# Patient Record
Sex: Female | Born: 1935 | Race: White | Hispanic: No | Smoking: Never smoker
Health system: Southern US, Community
[De-identification: ages and names within clinical notes are randomized; demographics above are authoritative.]

## PROBLEM LIST (undated history)

## (undated) DIAGNOSIS — Z9889 Other specified postprocedural states: Secondary | ICD-10-CM

## (undated) DIAGNOSIS — E538 Deficiency of other specified B group vitamins: Secondary | ICD-10-CM

## (undated) DIAGNOSIS — I951 Orthostatic hypotension: Secondary | ICD-10-CM

## (undated) DIAGNOSIS — D472 Monoclonal gammopathy: Secondary | ICD-10-CM

## (undated) DIAGNOSIS — D649 Anemia, unspecified: Secondary | ICD-10-CM

## (undated) DIAGNOSIS — I5189 Other ill-defined heart diseases: Secondary | ICD-10-CM

## (undated) DIAGNOSIS — M1990 Unspecified osteoarthritis, unspecified site: Secondary | ICD-10-CM

## (undated) DIAGNOSIS — I10 Essential (primary) hypertension: Secondary | ICD-10-CM

## (undated) DIAGNOSIS — R112 Nausea with vomiting, unspecified: Secondary | ICD-10-CM

## (undated) DIAGNOSIS — M797 Fibromyalgia: Secondary | ICD-10-CM

## (undated) DIAGNOSIS — I2510 Atherosclerotic heart disease of native coronary artery without angina pectoris: Secondary | ICD-10-CM

## (undated) DIAGNOSIS — I350 Nonrheumatic aortic (valve) stenosis: Secondary | ICD-10-CM

## (undated) DIAGNOSIS — M47812 Spondylosis without myelopathy or radiculopathy, cervical region: Secondary | ICD-10-CM

## (undated) DIAGNOSIS — K219 Gastro-esophageal reflux disease without esophagitis: Secondary | ICD-10-CM

## (undated) DIAGNOSIS — K5790 Diverticulosis of intestine, part unspecified, without perforation or abscess without bleeding: Secondary | ICD-10-CM

## (undated) DIAGNOSIS — K589 Irritable bowel syndrome without diarrhea: Secondary | ICD-10-CM

## (undated) DIAGNOSIS — G5600 Carpal tunnel syndrome, unspecified upper limb: Secondary | ICD-10-CM

## (undated) DIAGNOSIS — I447 Left bundle-branch block, unspecified: Secondary | ICD-10-CM

## (undated) HISTORY — PX: VAGINAL HYSTERECTOMY: SUR661

## (undated) HISTORY — DX: Essential (primary) hypertension: I10

## (undated) HISTORY — PX: VESICOVAGINAL FISTULA CLOSURE W/ TAH: SUR271

## (undated) HISTORY — DX: Nonrheumatic aortic (valve) stenosis: I35.0

## (undated) HISTORY — PX: BREAST EXCISIONAL BIOPSY: SUR124

## (undated) HISTORY — DX: Diverticulosis of intestine, part unspecified, without perforation or abscess without bleeding: K57.90

## (undated) HISTORY — DX: Fibromyalgia: M79.7

## (undated) HISTORY — PX: CHOLECYSTECTOMY: SHX55

## (undated) HISTORY — DX: Carpal tunnel syndrome, unspecified upper limb: G56.00

## (undated) HISTORY — PX: OTHER SURGICAL HISTORY: SHX169

## (undated) HISTORY — DX: Orthostatic hypotension: I95.1

## (undated) HISTORY — DX: Spondylosis without myelopathy or radiculopathy, cervical region: M47.812

## (undated) HISTORY — DX: Gastro-esophageal reflux disease without esophagitis: K21.9

## (undated) HISTORY — DX: Unspecified osteoarthritis, unspecified site: M19.90

## (undated) HISTORY — DX: Anemia, unspecified: D64.9

## (undated) HISTORY — DX: Atherosclerotic heart disease of native coronary artery without angina pectoris: I25.10

## (undated) HISTORY — DX: Irritable bowel syndrome, unspecified: K58.9

## (undated) HISTORY — DX: Monoclonal gammopathy: D47.2

## (undated) HISTORY — DX: Other ill-defined heart diseases: I51.89

## (undated) HISTORY — PX: CARDIAC CATHETERIZATION: SHX172

## (undated) HISTORY — DX: Deficiency of other specified B group vitamins: E53.8

---

## 1999-06-17 ENCOUNTER — Encounter: Payer: Self-pay | Admitting: Emergency Medicine

## 1999-06-17 ENCOUNTER — Emergency Department (HOSPITAL_COMMUNITY): Admission: EM | Admit: 1999-06-17 | Discharge: 1999-06-17 | Payer: Self-pay | Admitting: Emergency Medicine

## 2000-01-23 ENCOUNTER — Inpatient Hospital Stay (HOSPITAL_COMMUNITY): Admission: AD | Admit: 2000-01-23 | Discharge: 2000-01-23 | Payer: Self-pay | Admitting: Gynecology

## 2000-02-24 ENCOUNTER — Other Ambulatory Visit: Admission: RE | Admit: 2000-02-24 | Discharge: 2000-02-24 | Payer: Self-pay | Admitting: Gynecology

## 2000-08-16 ENCOUNTER — Ambulatory Visit (HOSPITAL_COMMUNITY): Admission: RE | Admit: 2000-08-16 | Discharge: 2000-08-16 | Payer: Self-pay | Admitting: Gastroenterology

## 2000-08-16 ENCOUNTER — Encounter: Payer: Self-pay | Admitting: Family Medicine

## 2000-08-16 ENCOUNTER — Encounter (INDEPENDENT_AMBULATORY_CARE_PROVIDER_SITE_OTHER): Payer: Self-pay | Admitting: *Deleted

## 2002-01-28 ENCOUNTER — Encounter: Payer: Self-pay | Admitting: Emergency Medicine

## 2002-01-28 ENCOUNTER — Emergency Department (HOSPITAL_COMMUNITY): Admission: EM | Admit: 2002-01-28 | Discharge: 2002-01-29 | Payer: Self-pay | Admitting: Emergency Medicine

## 2002-01-29 ENCOUNTER — Encounter: Payer: Self-pay | Admitting: Neurology

## 2003-01-30 ENCOUNTER — Other Ambulatory Visit: Payer: Self-pay

## 2003-05-19 ENCOUNTER — Other Ambulatory Visit: Admission: RE | Admit: 2003-05-19 | Discharge: 2003-05-19 | Payer: Self-pay | Admitting: Gynecology

## 2004-05-04 ENCOUNTER — Encounter: Payer: Self-pay | Admitting: Interventional Cardiology

## 2004-05-04 ENCOUNTER — Inpatient Hospital Stay (HOSPITAL_COMMUNITY): Admission: AD | Admit: 2004-05-04 | Discharge: 2004-05-05 | Payer: Self-pay | Admitting: Interventional Cardiology

## 2004-06-24 ENCOUNTER — Encounter: Payer: Self-pay | Admitting: Family Medicine

## 2005-02-07 ENCOUNTER — Inpatient Hospital Stay (HOSPITAL_COMMUNITY): Admission: EM | Admit: 2005-02-07 | Discharge: 2005-02-08 | Payer: Self-pay | Admitting: *Deleted

## 2005-03-31 ENCOUNTER — Encounter: Admission: RE | Admit: 2005-03-31 | Discharge: 2005-03-31 | Payer: Self-pay | Admitting: Gastroenterology

## 2005-08-26 ENCOUNTER — Encounter: Payer: Self-pay | Admitting: Family Medicine

## 2006-03-06 ENCOUNTER — Ambulatory Visit: Payer: Self-pay | Admitting: Oncology

## 2006-04-11 ENCOUNTER — Encounter: Payer: Self-pay | Admitting: Family Medicine

## 2006-04-11 LAB — CBC WITH DIFFERENTIAL/PLATELET
BASO%: 0.8 % (ref 0.0–2.0)
EOS%: 2.3 % (ref 0.0–7.0)
HCT: 37 % (ref 34.8–46.6)
LYMPH%: 34.1 % (ref 14.0–48.0)
MCH: 28.2 pg (ref 26.0–34.0)
MCHC: 34.5 g/dL (ref 32.0–36.0)
MCV: 81.7 fL (ref 81.0–101.0)
MONO#: 0.5 10*3/uL (ref 0.1–0.9)
MONO%: 7.4 % (ref 0.0–13.0)
NEUT%: 55.4 % (ref 39.6–76.8)
Platelets: 331 10*3/uL (ref 145–400)

## 2006-04-13 LAB — COMPREHENSIVE METABOLIC PANEL
AST: 22 U/L (ref 0–37)
BUN: 19 mg/dL (ref 6–23)
Calcium: 9.4 mg/dL (ref 8.4–10.5)
Chloride: 103 mEq/L (ref 96–112)
Creatinine, Ser: 0.93 mg/dL (ref 0.40–1.20)
Total Bilirubin: 0.4 mg/dL (ref 0.3–1.2)

## 2006-04-13 LAB — IMMUNOFIXATION ELECTROPHORESIS
IgA: 299 mg/dL (ref 68–378)
IgG (Immunoglobin G), Serum: 1130 mg/dL (ref 694–1618)
IgM, Serum: 187 mg/dL (ref 60–263)
Total Protein, Serum Electrophoresis: 6.8 g/dL (ref 6.0–8.3)

## 2006-04-21 ENCOUNTER — Ambulatory Visit: Payer: Self-pay | Admitting: Oncology

## 2006-04-25 LAB — UIFE/LIGHT CHAINS/TP QN, 24-HR UR
Free Kappa Lt Chains,Ur: 0.8 mg/dL (ref 0.04–1.51)
Free Kappa/Lambda Ratio: 8 ratio — ABNORMAL HIGH (ref 0.46–4.00)
Free Lambda Lt Chains,Ur: 0.1 mg/dL (ref 0.08–1.01)
Free Lt Chn Excr Rate: 24 mg/d
Total Protein, Urine-Ur/day: 33 mg/d (ref 10–140)
Total Protein, Urine: 1.1 mg/dL
Volume, Urine: 3000 mL

## 2006-04-26 LAB — CREATININE CLEARANCE, URINE, 24 HOUR

## 2006-06-06 ENCOUNTER — Ambulatory Visit: Payer: Self-pay | Admitting: Oncology

## 2006-06-08 LAB — COMPREHENSIVE METABOLIC PANEL
ALT: 22 U/L (ref 0–35)
AST: 24 U/L (ref 0–37)
Alkaline Phosphatase: 64 U/L (ref 39–117)
BUN: 22 mg/dL (ref 6–23)
Calcium: 9.3 mg/dL (ref 8.4–10.5)
Chloride: 100 mEq/L (ref 96–112)
Creatinine, Ser: 0.99 mg/dL (ref 0.40–1.20)

## 2006-06-08 LAB — CBC WITH DIFFERENTIAL/PLATELET
BASO%: 0.9 % (ref 0.0–2.0)
EOS%: 1.6 % (ref 0.0–7.0)
HCT: 37.7 % (ref 34.8–46.6)
MCH: 28 pg (ref 26.0–34.0)
MCHC: 34.1 g/dL (ref 32.0–36.0)
MCV: 82 fL (ref 81.0–101.0)
MONO%: 8.5 % (ref 0.0–13.0)
NEUT%: 57.3 % (ref 39.6–76.8)
RDW: 14.6 % — ABNORMAL HIGH (ref 11.3–14.5)
lymph#: 2.1 10*3/uL (ref 0.9–3.3)

## 2006-06-08 LAB — IGG, IGA, IGM
IgA: 313 mg/dL (ref 68–378)
IgG (Immunoglobin G), Serum: 1140 mg/dL (ref 694–1618)
IgM, Serum: 184 mg/dL (ref 60–263)

## 2006-10-21 ENCOUNTER — Encounter: Payer: Self-pay | Admitting: Family Medicine

## 2006-12-05 ENCOUNTER — Ambulatory Visit: Payer: Self-pay | Admitting: Oncology

## 2006-12-07 LAB — CBC WITH DIFFERENTIAL/PLATELET
Basophils Absolute: 0.1 10*3/uL (ref 0.0–0.1)
Eosinophils Absolute: 0.1 10*3/uL (ref 0.0–0.5)
HGB: 14.2 g/dL (ref 11.6–15.9)
MCV: 84.7 fL (ref 81.0–101.0)
MONO#: 0.4 10*3/uL (ref 0.1–0.9)
MONO%: 4.6 % (ref 0.0–13.0)
NEUT#: 6.2 10*3/uL (ref 1.5–6.5)
RBC: 4.82 10*6/uL (ref 3.70–5.32)
RDW: 13.8 % (ref 11.3–14.5)
WBC: 8.5 10*3/uL (ref 3.9–10.0)
lymph#: 1.8 10*3/uL (ref 0.9–3.3)

## 2006-12-07 LAB — COMPREHENSIVE METABOLIC PANEL
Albumin: 4.2 g/dL (ref 3.5–5.2)
Alkaline Phosphatase: 75 U/L (ref 39–117)
BUN: 18 mg/dL (ref 6–23)
CO2: 23 mEq/L (ref 19–32)
Calcium: 9.2 mg/dL (ref 8.4–10.5)
Chloride: 100 mEq/L (ref 96–112)
Glucose, Bld: 104 mg/dL — ABNORMAL HIGH (ref 70–99)
Potassium: 4.4 mEq/L (ref 3.5–5.3)
Sodium: 135 mEq/L (ref 135–145)
Total Protein: 7.5 g/dL (ref 6.0–8.3)

## 2006-12-07 LAB — IGG, IGA, IGM: IgM, Serum: 184 mg/dL (ref 60–263)

## 2007-03-27 ENCOUNTER — Encounter: Payer: Self-pay | Admitting: Family Medicine

## 2007-06-05 ENCOUNTER — Ambulatory Visit: Payer: Self-pay | Admitting: Oncology

## 2007-06-08 ENCOUNTER — Encounter: Payer: Self-pay | Admitting: Family Medicine

## 2007-07-12 ENCOUNTER — Encounter: Payer: Self-pay | Admitting: Family Medicine

## 2007-07-24 ENCOUNTER — Encounter: Payer: Self-pay | Admitting: Family Medicine

## 2007-08-21 ENCOUNTER — Encounter: Payer: Self-pay | Admitting: Family Medicine

## 2007-08-28 ENCOUNTER — Encounter: Payer: Self-pay | Admitting: Family Medicine

## 2007-12-04 ENCOUNTER — Ambulatory Visit: Payer: Self-pay | Admitting: Family Medicine

## 2007-12-04 DIAGNOSIS — K589 Irritable bowel syndrome without diarrhea: Secondary | ICD-10-CM | POA: Insufficient documentation

## 2007-12-04 DIAGNOSIS — E785 Hyperlipidemia, unspecified: Secondary | ICD-10-CM

## 2007-12-04 DIAGNOSIS — E539 Vitamin B deficiency, unspecified: Secondary | ICD-10-CM | POA: Insufficient documentation

## 2007-12-04 DIAGNOSIS — J309 Allergic rhinitis, unspecified: Secondary | ICD-10-CM | POA: Insufficient documentation

## 2007-12-04 DIAGNOSIS — I10 Essential (primary) hypertension: Secondary | ICD-10-CM

## 2007-12-04 DIAGNOSIS — K5730 Diverticulosis of large intestine without perforation or abscess without bleeding: Secondary | ICD-10-CM | POA: Insufficient documentation

## 2007-12-04 DIAGNOSIS — M791 Myalgia, unspecified site: Secondary | ICD-10-CM | POA: Insufficient documentation

## 2007-12-06 ENCOUNTER — Ambulatory Visit: Payer: Self-pay | Admitting: Oncology

## 2007-12-10 LAB — CBC WITH DIFFERENTIAL/PLATELET
Basophils Absolute: 0 10*3/uL (ref 0.0–0.1)
Eosinophils Absolute: 0.1 10*3/uL (ref 0.0–0.5)
HCT: 37.9 % (ref 34.8–46.6)
HGB: 13.1 g/dL (ref 11.6–15.9)
LYMPH%: 26.8 % (ref 14.0–48.0)
MCV: 85.6 fL (ref 81.0–101.0)
MONO%: 4.6 % (ref 0.0–13.0)
NEUT#: 4.9 10*3/uL (ref 1.5–6.5)
NEUT%: 67 % (ref 39.6–76.8)
Platelets: 268 10*3/uL (ref 145–400)
RDW: 13.6 % (ref 11.3–14.5)

## 2007-12-11 ENCOUNTER — Encounter: Payer: Self-pay | Admitting: Family Medicine

## 2007-12-11 LAB — COMPREHENSIVE METABOLIC PANEL
Albumin: 3.9 g/dL (ref 3.5–5.2)
Alkaline Phosphatase: 59 U/L (ref 39–117)
BUN: 27 mg/dL — ABNORMAL HIGH (ref 6–23)
Creatinine, Ser: 1.31 mg/dL — ABNORMAL HIGH (ref 0.40–1.20)
Glucose, Bld: 96 mg/dL (ref 70–99)
Potassium: 4.5 mEq/L (ref 3.5–5.3)

## 2007-12-18 ENCOUNTER — Encounter: Payer: Self-pay | Admitting: Family Medicine

## 2008-01-02 ENCOUNTER — Telehealth: Payer: Self-pay | Admitting: Family Medicine

## 2008-01-21 ENCOUNTER — Ambulatory Visit: Payer: Self-pay | Admitting: Family Medicine

## 2008-01-21 DIAGNOSIS — G5600 Carpal tunnel syndrome, unspecified upper limb: Secondary | ICD-10-CM

## 2008-01-21 DIAGNOSIS — M5030 Other cervical disc degeneration, unspecified cervical region: Secondary | ICD-10-CM

## 2008-01-21 DIAGNOSIS — M19049 Primary osteoarthritis, unspecified hand: Secondary | ICD-10-CM

## 2008-01-28 ENCOUNTER — Encounter: Payer: Self-pay | Admitting: Family Medicine

## 2008-03-14 ENCOUNTER — Ambulatory Visit: Payer: Self-pay | Admitting: Family Medicine

## 2008-03-14 LAB — CONVERTED CEMR LAB
Glucose, Urine, Semiquant: NEGATIVE
Ketones, urine, test strip: NEGATIVE
Nitrite: NEGATIVE
RBC / HPF: 0
Specific Gravity, Urine: 1.015
WBC Urine, dipstick: NEGATIVE
WBC, UA: 0 cells/hpf
pH: 6.5

## 2008-03-20 ENCOUNTER — Inpatient Hospital Stay (HOSPITAL_COMMUNITY): Admission: EM | Admit: 2008-03-20 | Discharge: 2008-03-20 | Payer: Self-pay | Admitting: Emergency Medicine

## 2008-03-26 ENCOUNTER — Ambulatory Visit: Payer: Self-pay | Admitting: Family Medicine

## 2008-03-26 DIAGNOSIS — R350 Frequency of micturition: Secondary | ICD-10-CM

## 2008-03-26 DIAGNOSIS — K219 Gastro-esophageal reflux disease without esophagitis: Secondary | ICD-10-CM | POA: Insufficient documentation

## 2008-03-26 LAB — CONVERTED CEMR LAB
Bilirubin Urine: NEGATIVE
Blood in Urine, dipstick: NEGATIVE
Nitrite: NEGATIVE
Urobilinogen, UA: 0.2
pH: 6.5

## 2008-04-11 ENCOUNTER — Ambulatory Visit: Payer: Self-pay | Admitting: Family Medicine

## 2008-04-14 LAB — CONVERTED CEMR LAB
Basophils Absolute: 0.1 10*3/uL (ref 0.0–0.1)
Basophils Relative: 1 % (ref 0–1)
Eosinophils Relative: 1 % (ref 0–5)
MCV: 82.2 fL (ref 78.0–100.0)
Neutro Abs: 4.4 10*3/uL (ref 1.7–7.7)

## 2008-04-15 ENCOUNTER — Encounter: Payer: Self-pay | Admitting: Family Medicine

## 2008-05-12 ENCOUNTER — Telehealth: Payer: Self-pay | Admitting: Family Medicine

## 2008-05-14 ENCOUNTER — Encounter (INDEPENDENT_AMBULATORY_CARE_PROVIDER_SITE_OTHER): Payer: Self-pay | Admitting: Internal Medicine

## 2008-05-14 ENCOUNTER — Ambulatory Visit: Payer: Self-pay | Admitting: Family Medicine

## 2008-05-14 DIAGNOSIS — N760 Acute vaginitis: Secondary | ICD-10-CM | POA: Insufficient documentation

## 2008-05-14 LAB — CONVERTED CEMR LAB

## 2008-05-30 ENCOUNTER — Ambulatory Visit: Payer: Self-pay | Admitting: Family Medicine

## 2008-06-05 ENCOUNTER — Ambulatory Visit: Payer: Self-pay | Admitting: Oncology

## 2008-06-09 ENCOUNTER — Encounter: Payer: Self-pay | Admitting: Family Medicine

## 2008-06-09 LAB — CBC WITH DIFFERENTIAL/PLATELET
Basophils Absolute: 0 10*3/uL (ref 0.0–0.1)
Eosinophils Absolute: 0.1 10*3/uL (ref 0.0–0.5)
HCT: 37.8 % (ref 34.8–46.6)
LYMPH%: 24.2 % (ref 14.0–49.7)
MCHC: 34 g/dL (ref 31.5–36.0)
MONO#: 0.4 10*3/uL (ref 0.1–0.9)
NEUT#: 4.4 10*3/uL (ref 1.5–6.5)
NEUT%: 68.6 % (ref 38.4–76.8)
Platelets: 303 10*3/uL (ref 145–400)
WBC: 6.5 10*3/uL (ref 3.9–10.3)

## 2008-06-09 LAB — COMPREHENSIVE METABOLIC PANEL
AST: 28 U/L (ref 0–37)
Albumin: 3.9 g/dL (ref 3.5–5.2)
Alkaline Phosphatase: 79 U/L (ref 39–117)
Calcium: 9.5 mg/dL (ref 8.4–10.5)
Chloride: 93 mEq/L — ABNORMAL LOW (ref 96–112)
Potassium: 4.3 mEq/L (ref 3.5–5.3)
Sodium: 129 mEq/L — ABNORMAL LOW (ref 135–145)
Total Protein: 7.2 g/dL (ref 6.0–8.3)

## 2008-06-11 ENCOUNTER — Ambulatory Visit: Payer: Self-pay | Admitting: Family Medicine

## 2008-06-11 DIAGNOSIS — Z780 Asymptomatic menopausal state: Secondary | ICD-10-CM | POA: Insufficient documentation

## 2008-06-12 ENCOUNTER — Ambulatory Visit: Payer: Self-pay | Admitting: Family Medicine

## 2008-06-13 ENCOUNTER — Ambulatory Visit: Payer: Self-pay | Admitting: Family Medicine

## 2008-06-16 LAB — CONVERTED CEMR LAB
TSH: 2.291 microintl units/mL (ref 0.350–4.500)
Vitamin B-12: 660 pg/mL (ref 211–911)

## 2008-06-17 ENCOUNTER — Encounter: Payer: Self-pay | Admitting: Family Medicine

## 2008-06-17 ENCOUNTER — Ambulatory Visit: Payer: Self-pay | Admitting: Internal Medicine

## 2008-12-01 ENCOUNTER — Ambulatory Visit: Payer: Self-pay | Admitting: Family Medicine

## 2008-12-05 ENCOUNTER — Ambulatory Visit: Payer: Self-pay | Admitting: Oncology

## 2008-12-09 LAB — LACTATE DEHYDROGENASE: LDH: 83 U/L — ABNORMAL LOW (ref 94–250)

## 2008-12-09 LAB — COMPREHENSIVE METABOLIC PANEL
ALT: 17 U/L (ref 0–35)
AST: 21 U/L (ref 0–37)
Calcium: 9.3 mg/dL (ref 8.4–10.5)
Chloride: 95 mEq/L — ABNORMAL LOW (ref 96–112)
Creatinine, Ser: 1.04 mg/dL (ref 0.40–1.20)

## 2008-12-09 LAB — CBC WITH DIFFERENTIAL/PLATELET
BASO%: 0 % (ref 0.0–2.0)
LYMPH%: 18.1 % (ref 14.0–49.7)
MCHC: 33.5 g/dL (ref 31.5–36.0)
MONO#: 0.4 10*3/uL (ref 0.1–0.9)
Platelets: 316 10*3/uL (ref 145–400)
RBC: 4.83 10*6/uL (ref 3.70–5.45)
RDW: 13.6 % (ref 11.2–14.5)
WBC: 7.8 10*3/uL (ref 3.9–10.3)
lymph#: 1.4 10*3/uL (ref 0.9–3.3)

## 2009-01-27 ENCOUNTER — Ambulatory Visit: Payer: Self-pay | Admitting: Family Medicine

## 2009-01-27 LAB — CONVERTED CEMR LAB
Bacteria, UA: 0
Bilirubin Urine: NEGATIVE
Glucose, Urine, Semiquant: NEGATIVE
Nitrite: NEGATIVE
Specific Gravity, Urine: <1.005
Urine crystals, microscopic: 0 /hpf
WBC Urine, dipstick: NEGATIVE
WBC, UA: 0 cells/hpf
Yeast, UA: 0
pH: 7

## 2009-01-28 ENCOUNTER — Ambulatory Visit: Payer: Self-pay | Admitting: Family Medicine

## 2009-01-28 DIAGNOSIS — R519 Headache, unspecified: Secondary | ICD-10-CM | POA: Insufficient documentation

## 2009-01-28 DIAGNOSIS — R51 Headache: Secondary | ICD-10-CM

## 2009-01-28 DIAGNOSIS — F430 Acute stress reaction: Secondary | ICD-10-CM

## 2009-01-28 LAB — CONVERTED CEMR LAB
KOH Prep: NEGATIVE
Whiff Test: NEGATIVE

## 2009-01-30 ENCOUNTER — Encounter (INDEPENDENT_AMBULATORY_CARE_PROVIDER_SITE_OTHER): Payer: Self-pay | Admitting: *Deleted

## 2009-01-30 ENCOUNTER — Encounter: Payer: Self-pay | Admitting: Family Medicine

## 2009-01-30 LAB — CONVERTED CEMR LAB
Basophils Absolute: 0 10*3/uL (ref 0.0–0.1)
Eosinophils Absolute: 0.1 10*3/uL (ref 0.0–0.7)
Eosinophils Relative: 2 % (ref 0.0–5.0)
Lymphocytes Relative: 27.9 % (ref 12.0–46.0)
MCHC: 33.4 g/dL (ref 30.0–36.0)
Monocytes Absolute: 0.5 10*3/uL (ref 0.1–1.0)
Neutro Abs: 4.7 10*3/uL (ref 1.4–7.7)
Neutrophils Relative %: 63.6 % (ref 43.0–77.0)
Platelets: 297 10*3/uL (ref 150.0–400.0)
Sed Rate: 32 mm/hr — ABNORMAL HIGH (ref 0–22)

## 2009-02-02 ENCOUNTER — Ambulatory Visit: Payer: Self-pay | Admitting: Internal Medicine

## 2009-02-06 ENCOUNTER — Ambulatory Visit: Payer: Self-pay | Admitting: Family Medicine

## 2009-02-11 ENCOUNTER — Encounter: Payer: Self-pay | Admitting: Family Medicine

## 2009-04-16 ENCOUNTER — Inpatient Hospital Stay (HOSPITAL_COMMUNITY)
Admission: EM | Admit: 2009-04-16 | Discharge: 2009-04-18 | Payer: Self-pay | Source: Home / Self Care | Admitting: Neurology

## 2009-04-17 ENCOUNTER — Ambulatory Visit: Payer: Self-pay | Admitting: Vascular Surgery

## 2009-06-03 ENCOUNTER — Telehealth: Payer: Self-pay | Admitting: Family Medicine

## 2009-06-05 ENCOUNTER — Ambulatory Visit: Payer: Self-pay | Admitting: Oncology

## 2009-06-08 LAB — CBC WITH DIFFERENTIAL/PLATELET
Basophils Absolute: 0.1 10*3/uL (ref 0.0–0.1)
EOS%: 1.3 % (ref 0.0–7.0)
HCT: 39.3 % (ref 34.8–46.6)
HGB: 13.1 g/dL (ref 11.6–15.9)
MCH: 28.5 pg (ref 25.1–34.0)
MCV: 85.7 fL (ref 79.5–101.0)
MONO%: 5.6 % (ref 0.0–14.0)
NEUT%: 64.7 % (ref 38.4–76.8)
Platelets: 278 10*3/uL (ref 145–400)

## 2009-06-08 LAB — COMPREHENSIVE METABOLIC PANEL
AST: 25 U/L (ref 0–37)
Albumin: 3.4 g/dL — ABNORMAL LOW (ref 3.5–5.2)
Alkaline Phosphatase: 52 U/L (ref 39–117)
BUN: 22 mg/dL (ref 6–23)
Potassium: 4.1 mEq/L (ref 3.5–5.3)
Sodium: 136 mEq/L (ref 135–145)

## 2009-06-08 LAB — IGG, IGA, IGM
IgA: 275 mg/dL (ref 68–378)
IgM, Serum: 168 mg/dL (ref 60–263)

## 2009-08-05 ENCOUNTER — Telehealth: Payer: Self-pay | Admitting: Family Medicine

## 2009-09-10 ENCOUNTER — Ambulatory Visit: Payer: Self-pay | Admitting: Family Medicine

## 2009-09-10 LAB — CONVERTED CEMR LAB
Bilirubin Urine: NEGATIVE
Blood in Urine, dipstick: NEGATIVE
Glucose, Urine, Semiquant: NEGATIVE
Urobilinogen, UA: 0.2

## 2009-09-11 ENCOUNTER — Encounter: Payer: Self-pay | Admitting: Family Medicine

## 2009-09-11 ENCOUNTER — Telehealth: Payer: Self-pay | Admitting: Family Medicine

## 2009-09-14 ENCOUNTER — Telehealth: Payer: Self-pay | Admitting: Family Medicine

## 2009-09-16 ENCOUNTER — Ambulatory Visit: Payer: Self-pay | Admitting: Family Medicine

## 2009-09-16 DIAGNOSIS — G609 Hereditary and idiopathic neuropathy, unspecified: Secondary | ICD-10-CM

## 2009-09-17 ENCOUNTER — Telehealth (INDEPENDENT_AMBULATORY_CARE_PROVIDER_SITE_OTHER): Payer: Self-pay | Admitting: *Deleted

## 2009-09-21 ENCOUNTER — Encounter: Payer: Self-pay | Admitting: Family Medicine

## 2009-09-21 LAB — CONVERTED CEMR LAB
Albumin: 3.8 g/dL (ref 3.5–5.2)
CO2: 29 meq/L (ref 19–32)
Eosinophils Relative: 0.9 % (ref 0.0–5.0)
Glucose, Bld: 98 mg/dL (ref 70–99)
HCT: 38 % (ref 36.0–46.0)
Hemoglobin: 12.8 g/dL (ref 12.0–15.0)
MCHC: 33.6 g/dL (ref 30.0–36.0)
MCV: 86 fL (ref 78.0–100.0)
Monocytes Relative: 6.2 % (ref 3.0–12.0)
RBC: 4.42 M/uL (ref 3.87–5.11)
RDW: 14.4 % (ref 11.5–14.6)
Sed Rate: 24 mm/hr — ABNORMAL HIGH (ref 0–22)
WBC: 7.4 10*3/uL (ref 4.5–10.5)

## 2009-10-12 ENCOUNTER — Ambulatory Visit: Payer: Self-pay | Admitting: Family Medicine

## 2009-10-23 ENCOUNTER — Ambulatory Visit: Payer: Self-pay | Admitting: Pulmonary Disease

## 2009-10-23 ENCOUNTER — Encounter: Payer: Self-pay | Admitting: Pulmonary Disease

## 2009-10-23 DIAGNOSIS — R05 Cough: Secondary | ICD-10-CM

## 2009-11-20 ENCOUNTER — Ambulatory Visit: Payer: Self-pay | Admitting: Pulmonary Disease

## 2009-11-26 ENCOUNTER — Ambulatory Visit: Payer: Self-pay | Admitting: Cardiology

## 2009-11-30 ENCOUNTER — Ambulatory Visit: Payer: Self-pay | Admitting: Family Medicine

## 2009-11-30 ENCOUNTER — Telehealth: Payer: Self-pay | Admitting: Family Medicine

## 2009-11-30 DIAGNOSIS — R109 Unspecified abdominal pain: Secondary | ICD-10-CM | POA: Insufficient documentation

## 2009-11-30 LAB — CONVERTED CEMR LAB
Casts: 0 /lpf
Ketones, urine, test strip: NEGATIVE
WBC Urine, dipstick: NEGATIVE
WBC, UA: 0 cells/hpf
Whiff Test: NEGATIVE
Yeast, UA: 0

## 2009-12-02 ENCOUNTER — Encounter: Payer: Self-pay | Admitting: Family Medicine

## 2009-12-08 ENCOUNTER — Ambulatory Visit: Payer: Self-pay | Admitting: Oncology

## 2009-12-08 ENCOUNTER — Encounter: Payer: Self-pay | Admitting: Family Medicine

## 2009-12-09 ENCOUNTER — Encounter: Payer: Self-pay | Admitting: Family Medicine

## 2009-12-09 ENCOUNTER — Ambulatory Visit: Payer: Self-pay | Admitting: Emergency Medicine

## 2009-12-10 ENCOUNTER — Encounter: Payer: Self-pay | Admitting: Family Medicine

## 2009-12-10 LAB — CONVERTED CEMR LAB
Basophils Absolute: 0 10*3/uL (ref 0.0–0.1)
Basophils Relative: 0.5 % (ref 0.0–3.0)
Eosinophils Absolute: 0.2 10*3/uL (ref 0.0–0.7)
Eosinophils Relative: 2.6 % (ref 0.0–5.0)
Hemoglobin: 12.8 g/dL (ref 12.0–15.0)
Lymphocytes Relative: 27 % (ref 12.0–46.0)
MCHC: 34.1 g/dL (ref 30.0–36.0)
Monocytes Absolute: 0.5 10*3/uL (ref 0.1–1.0)
Neutro Abs: 4 10*3/uL (ref 1.4–7.7)
Neutrophils Relative %: 62.2 % (ref 43.0–77.0)
RDW: 14 % (ref 11.5–14.6)
WBC: 6.4 10*3/uL (ref 4.5–10.5)

## 2009-12-29 ENCOUNTER — Ambulatory Visit: Payer: Self-pay | Admitting: Emergency Medicine

## 2010-01-01 LAB — PATHOLOGY REPORT

## 2010-01-13 ENCOUNTER — Ambulatory Visit: Payer: Self-pay | Admitting: Oncology

## 2010-01-20 ENCOUNTER — Telehealth: Payer: Self-pay | Admitting: Family Medicine

## 2010-02-12 ENCOUNTER — Ambulatory Visit: Payer: Self-pay | Admitting: Oncology

## 2010-02-15 LAB — CBC WITH DIFFERENTIAL/PLATELET
Basophils Absolute: 0 10*3/uL (ref 0.0–0.1)
EOS%: 1.3 % (ref 0.0–7.0)
LYMPH%: 25.4 % (ref 14.0–49.7)
MCH: 28.8 pg (ref 25.1–34.0)
MCHC: 34 g/dL (ref 31.5–36.0)
MCV: 84.6 fL (ref 79.5–101.0)
MONO%: 8 % (ref 0.0–14.0)
NEUT#: 5.2 10*3/uL (ref 1.5–6.5)
NEUT%: 64.7 % (ref 38.4–76.8)
Platelets: 315 10*3/uL (ref 145–400)
RDW: 14.4 % (ref 11.2–14.5)
WBC: 8.1 10*3/uL (ref 3.9–10.3)

## 2010-02-15 LAB — COMPREHENSIVE METABOLIC PANEL
ALT: 17 U/L (ref 0–35)
Albumin: 4.2 g/dL (ref 3.5–5.2)
Alkaline Phosphatase: 59 U/L (ref 39–117)
BUN: 22 mg/dL (ref 6–23)
Chloride: 96 mEq/L (ref 96–112)
Glucose, Bld: 103 mg/dL — ABNORMAL HIGH (ref 70–99)
Sodium: 132 mEq/L — ABNORMAL LOW (ref 135–145)
Total Bilirubin: 0.5 mg/dL (ref 0.3–1.2)

## 2010-02-15 LAB — IGG, IGA, IGM
IgA: 290 mg/dL (ref 68–378)
IgG (Immunoglobin G), Serum: 1040 mg/dL (ref 694–1618)

## 2010-02-25 NOTE — Progress Notes (Signed)
Summary: nexium  Phone Note Refill Request Call back at Home Phone [phone] Message from:  Patient on September 11, 2009 1:25 PM  Refills Requested: Medication #1:  NEXIUM 40 MG CPDR one by mouth once daily Patient needs script sent to [facility].   Initial call taken by: Melody Comas,  September 11, 2009 1:26 PM  Follow-up for Phone Call        Script sent to pharmacy. Follow-up by: Lowella Petties CMA,  September 11, 2009 2:31 PM    Prescriptions: NEXIUM 40 MG CPDR (ESOMEPRAZOLE MAGNESIUM) one by mouth once daily  #90 x 2   Entered by:   Lowella Petties CMA   Authorized by:   Judith Part MD   Signed by:   Lowella Petties CMA on 09/11/2009   Method used:   Faxed to ...       [facility] (mail-order)             , [place]         Ph: [phone]       Fax: [phone]   RxID:   [id number]

## 2010-02-25 NOTE — Assessment & Plan Note (Signed)
Summary: consult for chronic cough   Visit Type:  Initial Consult Copy to:  Roxy Manns MD Primary Provider/Referring Provider:  Judith Part MD  CC:  Pulmonary consult. [name]  History of Present Illness: The pt is a 77y/o female who I have been asked to see for chronic cough.  The pt states that she has been coughing for 3 years, and it has been coming and going during this time.  It is worse at night, and produces minimal mucus.  It typically starts in the am's, and she gets hoarse as the day progresses.  She will occasionally feel congestion in her chest, and can also have sinus pressure at times.  She has severe postnasal drip that is improved on atrovent nasal spray.  She also uses a nettie pot two times a day.  She has rare reflux symptoms, and takes nexium one a day.  She does have a globus sensation, and will do frequent throat clearing.  She has no h/o childhood asthma, and has had no breathing issues.  She had a cxr approx 6mos ago with "something in RML"?  She had a ct sinus done in Jan with no acute or chronic process.  The pt tells me that she has seen ENT in [facility] one month ago, where allergy testing showed only a sensitivity to dust mites, and she was told that her VC were "stretched"?  Current Medications (verified): 1)  Ogen 1.25 1.5 Mg Tabs (Estropipate) .... One By Mouth Daily 2)  Lipitor 20 Mg Tabs (Atorvastatin Calcium) .... One By Mouth Daily 3)  Vitamin D3 1000 Unit .... .three Tabs By Mouth Daily 4)  Magnesium 500 Mg Tabs (Magnesium) .... One By Mouth Two Times A Day 5)  Calcium 500 Mg Tabs (Calcium Carbonate) .... One By Mouth Three Times A Day 6)  Vitamin B-1 100 Mg Tabs (Thiamine Hcl) .... One By Mouth Daily 7)  Hyoscyamine Sulfate  1.25mg  .... 2-4 Tabs By Mouth Daily 8)  Flexeril 10 Mg Tabs (Cyclobenzaprine Hcl) .... 1/2 By Mouth Qhs 9)  Nystatin 100000 Unit/gm Crea (Nystatin) .... Apply Small Amount To Affected Areas Once Daily As Needed 10)  Bactroban 2 % Oint  (Mupirocin) .... Apply To Affected Area Two Times A Day in Nose As Needed. 11)  Lovaza 1 Gm Caps (Omega-3-Acid Ethyl Esters) .... Take One Tablet By Mouth Three Times Daily 12)  Zyrtec Allergy 10 Mg Tabs (Cetirizine Hcl) .... Otc As Directed. 13)  Nexium 40 Mg Cpdr (Esomeprazole Magnesium) .... Take 1 Capsule By Mouth Once A Day Before Breakfast. 14)  Netty Pot .... Otc As Directed. 15)  Prednisolone Acetate 1 % Susp (Prednisolone Acetate) .... One Drop Per Eye Once Daily 16)  Ipratropium Bromide 0.06 % Soln (Ipratropium Bromide) .... 2 Sprays Each Nostrile Two Times A Day 17)  Tessalon Perles 100 Mg  Caps (Benzonatate) .... Two By Mouth Every 6 Hrs If Needed For Cough  Allergies (verified): 1)  ! Sulfa  Past History:  Past Medical History: anemia  IGg monoclonal gammopathy arthritis diverticulosis GERD all rhinitis heart M hyperlipidemia  urinary incontinence  IBS fibromyalgia  carpal tunnel  cervical deg disc dz  CAD - 1 vessel with med control  chronic dry eyes  sees heme for elevated lab (? what)  HTN - recent  vit B12 deficiency (shots every month) overactive bladder/ incontinence ? TIa vs migraine  carotid dopplers nl 3/11  rheum-- Dr Jon Billings  GI-- Dr Matthias Hughs  heme-- Dr Arline Asp  gyn -- Dr Greta Doom  cardiol-- Dr Katrinka Blazing  Past Surgical History: tsa hysterectomy-- all but one ovary 1969 (for bleeding, and fibroids) colonoscopy 8/07 - normal/ no polyps , some tics  stress test ? 08 cardiac cath 2/10 neg (per pt)- Dr Katrinka Blazing dexa - normal 5/10 hosp 3/11 -- TIA vs poss migraine -  nl scans and no carotid stenosis Cholecystectomy  Family History: Reviewed history from 01/28/2009 and no changes required. mother - died with diverticulosis , OP  father - killed in accident at age 47, CAD P aunts and uncles with heart dz  2 P aunts with breast cancer  M aunts and uncles with CAD  4 brothers -- CAD 1 brother - prostate ca  1 brother- leukemia  grand daughter-  bipolar   Social History: Reviewed history from 01/28/2009 and no changes required. 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   Review of Systems       The patient complains of productive cough, abdominal pain, difficulty swallowing, nasal congestion/difficulty breathing through nose, sneezing, joint stiffness or pain, and change in color of mucus.  The patient denies shortness of breath with activity, shortness of breath at rest, non-productive cough, coughing up blood, chest pain, irregular heartbeats, acid heartburn, indigestion, loss of appetite, weight change, sore throat, tooth/dental problems, headaches, itching, ear ache, anxiety, depression, hand/feet swelling, rash, and fever.    Vital Signs:  Patient profile:   77 year old female Height:      62.5 inches Weight:      147.50 pounds BMI:     26.64 O2 Sat:      95 % on Room air Temp:     97.7 degrees F oral Pulse rate:   82 / minute BP sitting:   132 / 68  (right arm) Cuff size:   regular  Vitals Entered By: Carver Fila (October 23, 2009 2:39 PM)  O2 Flow:  Room air CC: Pulmonary consult.  Comments meds and allergies updated Phone number updated Carver Fila  October 23, 2009 2:39 PM    Physical Exam  General:  wd female in nad Eyes:  PERRLA and EOMI.   Nose:  narrowed bilat, patent, no purulence or drainage noted. Mouth:  clear, no lesion or exudates. Neck:  no jvd, tmg, LN Lungs:  totally clear to auscultation Heart:  rrr,no rubs or gallops.  2/6 sem Abdomen:  soft and nontender, bs+ Extremities:  no edema or cyanosis , pulses intact distally Neurologic:  alert and oriented, moves all 4.   Impression & Recommendations:  Problem # 1:  COUGH, CHRONIC (ICD-786.2) The pt's cough sounds more upper airway in origin than lower.   She has a known issue with postnasal drip, and she definitely has a cyclical component to her cough.  She may respond to behavioral therapy to try and "cool off" her upper airway.  What I do not know is whether she may have something pulmonary going on.  Her spirometry today is totally normal, but there is a question of some abnormality in her RML?  ?bronchiectasis?  At this point, I would like to treat the most likely cause of her cough, namely an upper airway issue.  If she fails to respond, will have to consider whether to re-image her sinuses or check ct chest to  evaluate for bronchiectasis.  Medications Added to Medication List This Visit: 1)  Prednisolone Acetate 1 % Susp (Prednisolone acetate) .... One drop per eye once daily 2)  Ipratropium Bromide 0.06 % Soln (Ipratropium bromide) .... 2 sprays each nostrile two times a day 3)  Tessalon Perles 100 Mg Caps (Benzonatate) .... Two by mouth every 6 hrs if needed for cough  Other Orders: Consultation Level IV (91478)  Patient Instructions: 1)  increase nexium to am and pm for next 3 weeks. 2)  stop zyrtec, and start chlorpheniramine 8mg  at bedtime everynight for next 3 weeks 3)  keep hard candy in your mouth (no peppermint or menthol) all day to bathe the back of your throat. 4)  no throat clearing...just swallow or drink something. 5)  minimize voice use...no singing or yelling.  minimize conversation for the next 3 weeks. 6)  tessalon pearls 2 every 6 hrs if needed for cough. 7)  followup with me in 3 weeks.   Prescriptions: TESSALON PERLES 100 MG  CAPS (BENZONATATE) two by mouth every 6 hrs if needed for cough  #30 x 1   Entered and Authorized by:   Barbaraann Share MD   Signed by:   Barbaraann Share MD on 10/23/2009   Method used:   Print then Give to Patient   RxID:   [id number]    Immunization History:  Influenza Immunization History:    Influenza:  historical (11/02/2008)

## 2010-02-25 NOTE — Letter (Signed)
[facility]: [facility]   Imported By: Lanelle Bal 12/16/2009 10:12:30  _____________________________________________________________________  External Attachment:    Type:   Image     Comment:   External Document

## 2010-02-25 NOTE — Assessment & Plan Note (Signed)
Summary: CONGESTION, POSSIBLE BLADDER INFECTION   Vital Signs:  Patient profile:   77 year old female Weight:      149 pounds Temp:     97.6 degrees F oral Pulse rate:   72 / minute Pulse rhythm:   regular BP sitting:   140 / 70  (left arm) Cuff size:   regular  Vitals Entered By: Lowella Petties CMA February 14, 2009 3:27 PM) CC: Sinus symptoms x one month, pain around anal area.   History of Present Illness: is feeling bad  sinus problems -- since noveber  got better after last course of abx for 2 days and sinus symptoms came back  using mucinex and benadryl   feels lousy  face aches and hurts on left side  nasal saline makes her nose burn  prod cough every am - is yellow in color (coughs off and on all day and at night )  is gargling with salt water  no fever - but does get a little chilled in afternoons  generally tired and run down for no reason   overall a productive cough in ams - for 4 years  no wheeze or sob   takes zyrtec for allergies  her urine had an odor dropped off specimen yesterday- ua was totaly clear some vulvar burning too - with a discharge -- ? if a vaginitis , is clear - ? if odor  notices discomfort  used a little monistat otc -- helped some , little itching   is having anxiety symptoms -- needs be calmed down -- - cries over everything -- grandaughter is the source of it -- bipolar with a  baby and giving the family a fit  mostly sensitive and weepy  she refuses counsling  has good friends and family to vent to  does occas write in a journal   Allergies: 1)  ! Sulfa  Past History:  Past Medical History: Last updated: 04/11/2008 anemia  IGg monoclonal gammopathy arthritis diverticulosis Chronic bronchitis  GERD all rhinitis heart M hyperlipidemia  urinary incontinence  IBS fibromyalgia  carpal tunnel  cervical deg disc dz  CAD - 1 vessel with med control  chronic dry eyes  sees heme for elevated lab (? what)  HTN - recent    vit B12 deficiency (shots every month) overactive bladder/ incontinence  rheum-- Dr Jon Billings  GI-- Dr Matthias Hughs  heme-- Dr Arline Asp  gyn -- Dr Greta Doom  cardiol-- Dr Katrinka Blazing  Past Surgical History: Last updated: 07/06/2008 [facility] tsa hysterectomy-- all but one ovary 1969 (for bleeding, and fibroids) colonoscopy 8/07 - normal/ no polyps , some tics  stress test ? 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/ brothers  floor exercises / stretches  cares for 2 acre yard -all year around  has bipolar grandaughter caring for child-- this is very stressful for her   Family History: mother - died with diverticulosis , OP  father - killed in accident at age 83, CAD  P  aunts and uncles with heart dz  2 P aunts with breast cancer  M aunts and uncles with CAD   3 brothers -- CAD 1 brother - prostate ca  1 brother- leukemia  grand daughter- bipolar   Social History: retired post Geologist, 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 Eyes:  Complains of eye pain; denies blurring, discharge, double vision, and eye irritation. ENT:  Complains of nasal congestion, postnasal drainage, and sinus pressure; denies sore throat. CV:  Denies chest pain or discomfort, lightheadness, and palpitations. Resp:  Denies  cough and wheezing. GI:  Denies abdominal pain, bloody stools, change in bowel habits, indigestion, and nausea. GU:  Complains of discharge and dysuria; denies abnormal vaginal bleeding and urinary hesitancy. MS:  Denies joint pain, joint redness, and joint swelling. Derm:  Denies lesion(s), poor wound healing, and rash. Neuro:  Complains of headaches; denies difficulty with concentration, disturbances in coordination, falling down, inability to speak, memory loss, numbness, tingling, visual disturbances, and weakness. Psych:  Complains of anxiety; denies depression. Endo:  Denies excessive thirst and excessive urination. Heme:  Denies abnormal bruising.  Physical Exam  General:  Well-developed,well-nourished,in no acute distress; alert,appropriate and cooperative throughout examination Head:  normocephalic, atraumatic, and no abnormalities observed.  tender L ethmoid/ maxillary sinuses mildly tender temporal area  TMJ not tender - nl rom  , no crepitice  Eyes:  vision grossly intact, pupils equal, pupils round, pupils reactive to light, and no injection.   Ears:  R ear normal and L ear normal.   Nose:  nares are injected and mildly congested  Mouth:  pharynx pink and moist, no erythema, and no exudates.   Neck:  supple with full rom and no masses or thyromegally, no JVD or carotid bruit  Lungs:  Normal respiratory effort, chest expands symmetrically. Lungs are clear to auscultation, no crackles or wheezes. Heart:  RRR with systolic M Abdomen:  very mild suprapubic tenderness without rebound or gaurding   Genitalia:  normal introitus, no external lesions, no vaginal discharge, and mucosa pink and moist.   Msk:  no CVA tenderness  nl rom hips and LS  Extremities:  No clubbing, cyanosis, edema, or deformity noted with normal full range of motion of all joints.   Neurologic:  cranial nerves II-XII intact, sensation intact to light touch, gait normal, and DTRs symmetrical and normal.  no  tremor  Skin:  Intact without suspicious lesions or rashes Cervical Nodes:  No lymphadenopathy noted Psych:  anxious today- but not tearful good eye contact and comm skills    Impression & Recommendations:  Problem # 1:  FACIAL PAIN (ICD-784.0) Assessment Deteriorated ongoing with congestion and chronic cough despite tx for sinusitis 2 mo ago  will check ESR and cbc with diff today (need to rule out temporal arteritis -- in light of location of pain) check limited CT of sinuses - then update Orders: Radiology Referral (Radiology) Venipuncture (62130) TLB-CBC Platelet - w/Differential (85025-CBCD) TLB-Sedimentation Rate (ESR) (85652-ESR)  Problem # 2:  UNSPECIFIED VAGINITIS AND VULVOVAGINITIS (ICD-616.10) Assessment: New with both yeast and clue cells (and bact ) on wet prep  will tx with diflucan and metrogel vaginal  nl ua yesterday update if symptoms are not imp in  1 week  The following medications were removed from the medication list:    Augmentin 875-125 Mg Tabs (Amoxicillin-pot clavulanate) .[name]... 1 by mouth two times a day for 10 days Her updated medication list for this problem includes:    Metrogel-vaginal 0.75 % Gel (Metronidazole) .[name]... 1 applicator intravaginally at bedtime for 7 days  Problem # 3:  ANXIETY, SITUATIONAL (ICD-308.3) Assessment: New brought on by family issues  disc coping tech in detail- has good support  pt declines counseling of any sort  is not interested in med now - but may be if things worsen (would consider ssri)  adv to stay active/ exercise/healthy diet and update me if she changes mind about tx   Complete Medication List: 1)  Ogen 1.25 1.5 Mg Tabs (Estropipate) .... One by mouth daily 2)  Omacar  .[name].. 3 tabs  by mouth daily 3)  Lipitor 20 Mg Tabs (Atorvastatin calcium) .... One by mouth daily 4)  Nexium 40 Mg Cpdr (Esomeprazole magnesium) .... One by mouth once daily 5)  Vitamin D3 1000 Unit  .... .three tabs by mouth daily 6)   Magnesium 500 Mg Tabs (Magnesium) .... One by mouth two times a day 7)  Calcium 500 Mg Tabs (Calcium carbonate) .... One by mouth three times a day 8)  Magnesium Malate 1000mg   .... One by mouth daily 9)  Vitamin B-1 100 Mg Tabs (thiamine Hcl)  .... One by mouth daily 10)  Hyoscyamine Sulfate 1.25mg   .... 2-4 tabs by mouth daily 11)  Flexeril 10 Mg Tabs (Cyclobenzaprine hcl) .... 1/2 by mouth qhs 12)  Nystatin 100000 Unit/gm Crea (Nystatin) .... Apply small amount to affected areas once daily prn 13)  Bactroban 2 % Oint (Mupirocin) .... Apply to affected area two times a day in nose 14)  Metrogel-vaginal 0.75 % Gel (Metronidazole) .[name].. 1 applicator intravaginally at bedtime for 7 days 15)  Diflucan 150 Mg Tabs (Fluconazole) .[name].. 1 by mouth times one now  Patient Instructions: 1)  take the diflucan for yeast  2)  use the metrogel vaginal as directed for bacterial vaginitis  3)  drink lots of water  4)  lab today- will update you 5)  we will set up cat scan of sinuses at check ou t-- then will make plan when I get a result  6)  please update me if you reconsider counseling or medication for your anxiety problem  Prescriptions: DIFLUCAN 150 MG TABS (FLUCONAZOLE) 1 by mouth times one now  #1 x 0   Entered and Authorized by:   Judith Part MD   Signed by:   Judith Part MD on 01/28/2009   Method used:   Print then Give to Patient   RxID:   [id number] METROGEL-VAGINAL 0.75 % GEL (METRONIDAZOLE) 1 applicator intravaginally at bedtime for 7 days  #1 course x 0   Entered and Authorized by:   Judith Part MD   Signed by:   Judith Part MD on 01/28/2009   Method used:   Print then Give to Patient   RxID:   [id number]   Prior Medications (reviewed today): OGEN 1.25 1.5 MG TABS (ESTROPIPATE) one by mouth daily OMACAR () 3 tabs  by mouth daily LIPITOR 20 MG TABS (ATORVASTATIN CALCIUM) one by mouth daily NEXIUM 40 MG CPDR (ESOMEPRAZOLE MAGNESIUM) one by mouth once  daily VITAMIN D3 1000 UNIT () .three tabs by mouth daily MAGNESIUM 500 MG TABS (MAGNESIUM) one by mouth two times a day CALCIUM  500 MG TABS (CALCIUM CARBONATE) one by mouth three times a day MAGNESIUM MALATE 1000MG  () one by mouth daily VITAMIN B-1 100 MG TABS (THIAMINE HCL) () one by mouth daily HYOSCYAMINE SULFATE  1.25MG  () 2-4 tabs by mouth daily FLEXERIL 10 MG TABS (CYCLOBENZAPRINE HCL) 1/2 by mouth qhs NYSTATIN 100000 UNIT/GM CREA (NYSTATIN) apply small amount to affected areas once daily prn BACTROBAN 2 % OINT (MUPIROCIN) apply to affected area two times a day in nose Current Allergies: ! SULFA  Laboratory Results    Wet [place]/KOH Source: vaginal  WBC/hpf 5-10 Bacteria/hpf 2+  Rods Clue cells/hpf few  Negative whiff Yeast/hpf few KOH Negative Trichomonas/hpf none

## 2010-02-25 NOTE — Letter (Signed)
Summary: Dr[facility]ud Hashmi,[facility],Note  Dr[facility]ud Hashmi,[facility],Note   Imported By: Beau Fanny 12/11/2009 16:29:22  _____________________________________________________________________  External Attachment:    Type:   Image     Comment:   External Document

## 2010-02-25 NOTE — Assessment & Plan Note (Signed)
Summary: acute sick visit for persistent cough   Visit Type:  Follow-up Copy to:  Roxy Manns MD Primary Provider/Referring Provider:  Colon Flattery Tower MD  CC:  follow up. pt states cough is getting worse x 1 week with yellow phlem. pt c/o chest tightness and throat irritated. [name]  History of Present Illness: the pt comes in today for f/u of her chronic cough of 3 yrs duration.  At the last visit, she was felt to have an upper airway cough with cyclical cough mechanism, and she was treated with a modified version of the cyclical cough protocol.  The question was raised whether she had a pulmonary issue in the past noted on cxr?[name]  She comes in today where her cough initially got better, but then has worsened over the last one week.  She is producing scant yellow mucus at times.  She c/o throat irritation, but is clearing her throat constantly today during the visit.    Current Medications (verified): 1)  Ogen 1.25 1.5 Mg Tabs (Estropipate) .... One By Mouth Daily 2)  Lipitor 20 Mg Tabs (Atorvastatin Calcium) .... One By Mouth Daily 3)  Vitamin D3 1000 Unit .... .three Tabs By Mouth Daily 4)  Magnesium 500 Mg Tabs (Magnesium) .... One By Mouth Two Times A Day 5)  Calcium 500 Mg Tabs (Calcium Carbonate) .... One By Mouth Three Times A Day 6)  Vitamin B-1 100 Mg Tabs (Thiamine Hcl) .... One By Mouth Daily 7)  Hyoscyamine Sulfate  1.25mg  .... 2-4 Tabs By Mouth Daily 8)  Flexeril 10 Mg Tabs (Cyclobenzaprine Hcl) .... 1/2 By Mouth Qhs 9)  Nystatin 100000 Unit/gm Crea (Nystatin) .... Apply Small Amount To Affected Areas Once Daily As Needed 10)  Bactroban 2 % Oint (Mupirocin) .... Apply To Affected Area Two Times A Day in Nose As Needed. 11)  Lovaza 1 Gm Caps (Omega-3-Acid Ethyl Esters) .... Take One Tablet By Mouth Three Times Daily 12)  Chlorpheniramine Maleate 4 Mg Tabs (Chlorpheniramine Maleate) .... 3 Tablets At Bedtime 13)  Nexium 40 Mg Cpdr (Esomeprazole Magnesium) .... Take 1 Capsule By Mouth  Once A Day Two Times A Day 14)  Netty Pot .... Otc As Directed. 15)  Prednisolone Acetate 1 % Susp (Prednisolone Acetate) .... One Drop Per Eye Once Daily 16)  Ipratropium Bromide 0.06 % Soln (Ipratropium Bromide) .... 2 Sprays Each Nostrile Two Times A Day 17)  Tessalon Perles 100 Mg  Caps (Benzonatate) .... Two By Mouth Every 6 Hrs If Needed For Cough  Allergies (verified): 1)  ! Sulfa  Past History:  Past medical, surgical, family and social histories (including risk factors) reviewed, and no changes noted (except as noted below).  Past Medical History: Reviewed history from 10/23/2009 and no changes required. anemia  IGg monoclonal gammopathy arthritis diverticulosis GERD all rhinitis heart M hyperlipidemia  urinary incontinence  IBS fibromyalgia  carpal tunnel  cervical deg disc dz  CAD - 1 vessel with med control  chronic dry eyes  sees heme for elevated lab (? what)  HTN - recent  vit B12 deficiency (shots every month) overactive bladder/ incontinence ? TIa vs migraine  carotid dopplers nl 3/11  rheum-- Dr Jon Billings  GI-- Dr Matthias Hughs  heme-- Dr Arline Asp  gyn -- Dr Greta Doom  cardiol-- Dr Katrinka Blazing  Past Surgical History: Reviewed history from 10/23/2009 and no changes required. tsa hysterectomy-- all but one ovary 1969 (for bleeding, and fibroids) colonoscopy 8/07 - normal/ no polyps , some tics  stress test ?  08 cardiac cath 2/10 neg (per pt)- Dr Katrinka Blazing dexa - normal 5/10 hosp 3/11 -- TIA vs poss migraine -  nl scans and no carotid stenosis Cholecystectomy  Family History: Reviewed history from 10/23/2009 and no changes required. mother - died with diverticulosis , OP  father - killed in accident at age 54, CAD P aunts and uncles with heart dz  2 P aunts with breast cancer  M aunts and uncles with CAD  4 brothers -- CAD 1 brother - prostate ca  1 brother- leukemia  grand daughter- bipolar   Social History: Reviewed history from 10/23/2009 and no  changes required. 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   Review of Systems       The patient complains of productive cough, joint stiffness or pain, and change in color of mucus.  The patient denies shortness of breath with activity, shortness of breath at rest, non-productive cough, coughing up blood, chest pain, irregular heartbeats, acid heartburn, indigestion, loss of appetite, weight change, abdominal pain, difficulty swallowing, sore throat, tooth/dental problems, headaches, nasal congestion/difficulty breathing through nose, sneezing, itching, ear ache, anxiety, depression, hand/feet swelling, rash, and fever.    Vital Signs:  Patient profile:   77 year old female Height:      62.5 inches Weight:      147.25 pounds BMI:     26.60 O2 Sat:      97 % on Room air Temp:     98.1 degrees F oral Pulse rate:   95 / minute BP sitting:   148 / 74  (left arm) Cuff size:   regular  Vitals Entered By: Carver Fila (November 20, 2009 3:51 PM)  O2 Flow:  Room air CC: follow up. pt states cough is getting worse x 1 week with yellow phlem. pt c/o chest tightness, throat irritated.  Comments meds and allergies updated Phone number updated Carver Fila  November 20, 2009 3:51 PM    Physical Exam  General:  wd female in nad Nose:  no obvious drainage or purulence Lungs:  totally clear to auscultation Heart:  rrr, no mrg Extremities:  no edema or cyanosis Neurologic:  alert and oriented, moves all 4.   Impression & Recommendations:  Problem # 1:  COUGH, CHRONIC (ICD-786.2) the pt continues to have chronic cough that I continue to believe is primarily upper airway in origin.  She is clearing her throat constantly!  She is now producing scant discolored mucus, but there is nothing to suggest a pulmonary  infection.  She had a negative ct sinuses earlier in the year, and has a h/o of "something in RML"  Will do ct chest to see if she has underlying bronchietasis.  In the meantime, she needs to really work hard on the behavioral therapies I have recommended.    Medications Added to Medication List This Visit: 1)  Chlorpheniramine Maleate 4 Mg Tabs (Chlorpheniramine maleate) .... 3 tablets at bedtime 2)  Nexium 40 Mg Cpdr (Esomeprazole magnesium) .... Take 1 capsule by mouth once a day two times a day  Other Orders: Est. Patient Level IV (40981) Radiology Referral (Radiology)  Patient Instructions: 1)  continue with hard candy, limiting voice use, NO throat clearing. 2)  stay on nexium twice a day for now. 3)  continue with chlorpheniramine  at bedtime if you think postnasal drip is still an issue. 4)  will check scan of your chest.  I will call you with results.   Immunization History:  Influenza Immunization History:    Influenza:  historical (11/13/2009)

## 2010-02-25 NOTE — Letter (Signed)
Summary: Dr.Masud Hashmi,[facility],Note  Dr.Masud Hashmi,[facility],Note   Imported By: Beau Fanny 12/11/2009 16:29:54  _____________________________________________________________________  External Attachment:    Type:   Image     Comment:   External Document

## 2010-02-25 NOTE — Progress Notes (Signed)
Summary: refill request for celebrex  Phone Note Refill Request Call back at Home Phone [phone] Message from:  Patient  Refills Requested: Medication #1:  celebrex 200 mg's take one po twice a day Pt requests refill, this is no longer on med list.  She said her gyn refilled it last time.  Uses [facility].  Initial call taken by: Lowella Petties CMA, AAMA,  January 20, 2010 3:29 PM  Follow-up for Phone Call        what is she on it for ?  this med can raise bp and cause stomach problems -- I usually suggest use only if absolutely necessary and I do not px two times a day -- that is too much Follow-up by: Judith Part MD,  January 20, 2010 4:30 PM  Additional Follow-up for Phone Call Additional follow up Details #1::        Pt said she takes med for fibromyalgia and pain in fingers and wrist (rt and lt). Pt said she has been taking for 5 yrs and so far they have not bothered her stomach. I told pt you donot prescribe for two times a day and she said ok. Please advise. Lewanda Rife LPN  January 20, 2010 4:35 PM     Additional Follow-up for Phone Call Additional follow up Details #2::    I can do daily with meal as needed  not two times a day -- that is too high dose for kidneys and GI system if any GI symptoms at all needs to let me know  px written on EMR for call in  Follow-up by: Judith Part MD,  January 20, 2010 4:41 PM  Additional Follow-up for Phone Call Additional follow up Details #3:: Details for Additional Follow-up Action Taken:   Patient notified as instructed by telephone. Pt said she had never had a problem with Celebrex bothering her and when I asked what pharmacy she wanted me to call med into she said she was not mad but she would find her another doctor that would do this. Pt said to have her records ready tomorrow and she would pick them up. I explained to pt Dr Milinda Antis was not refusing to give the med but not twice a day and explained again it was out of concern  for her that the Celebrex could bother the GI system. Pt said again she was not mad but she was going to find another dr.  I explained I was not sure records could be ready tomorrow. Pt said alright. Please advise. Lewanda Rife LPN  January 20, 2010 5:09 PM    since records are computer based -- is better to get est / find new doctor and have [place] send them but whatever she wants is ok Additional Follow-up by: Judith Part MD,  January 20, 2010 5:20 PM  New/Updated Medications: CELEBREX 200 MG CAPS (CELECOXIB) 1 by mouth once daily with food as needed pain / fibromyalgia stop if any GI symptoms Prescriptions: CELEBREX 200 MG CAPS (CELECOXIB) 1 by mouth once daily with food as needed pain / fibromyalgia stop if any GI symptoms  #30 x 5   Entered and Authorized by:   Judith Part MD   Signed by:   Lewanda Rife LPN on 86/57/8469   Method used:   Telephoned to ...       [facility]* (retail)       [address]       [place], [place]  [address]       Ph: [phone]       Fax: [phone]   RxID:   [id number]   Pt stated she had already chosen another doctor and would come by to sign a record release for her records to go to that doctor. Pt said she had no bad feelings.Lewanda Rife LPN  January 22, 2010 10:13 AM
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
## 2010-02-25 NOTE — Medication Information (Signed)
Summary: Approval for Protonix/[employer]  Approval for Protonix/[employer]   Imported By: Lanelle Bal 09/30/2009 12:37:19  _____________________________________________________________________  External Attachment:    Type:   Image     Comment:   External Document

## 2010-02-25 NOTE — Consult Note (Signed)
Summary: [facility]  [facility]   Imported By: Lanelle Bal 03/02/2009 12:20:10  _____________________________________________________________________  External Attachment:    Type:   Image     Comment:   External Document

## 2010-02-25 NOTE — Assessment & Plan Note (Signed)
Summary: FOLLOW UP   Vital Signs:  Patient profile:   77 year old female Weight:      148 pounds BMI:     26.73 Temp:     97.7 degrees F oral Pulse rate:   88 / minute Pulse rhythm:   regular BP sitting:   140 / 58  (left arm) Cuff size:   regular  Vitals Entered By: Lowella Petties CMA (February 06, 2009 3:13 PM) CC: follow-up visit   History of Present Illness: is here for f/u of sinus problems and headache at last visit - had ordered CT of sinuses that came back nl  labs showed nl cbc and ESR elevated at 32 was started on prednisone at that time   is still having symptoms  eyes itch and burn all the time  and L nostril burns and runs all the time   headache has eased off a bit -- quite a bit of improvement with that   spits up mucous all day- yellow to white  zyrtec helps some- has taken for 2-3 years  no wheezing at all   goes regularly for vision exams -- her vision has generally worsened over the past 6 mo  was put on restasis but it burned her eyes too much  has appt to f/u in next mo   has had allergies from her late 57s -- a lot of chemicals / perhaps some foods  pollen does also bother her a lot -- wears a mask to mow     Allergies: 1)  ! Sulfa  Past History:  Past Medical History: Last updated: 04/11/2008 anemia  IGg monoclonal gammopathy arthritis diverticulosis Chronic bronchitis  GERD all rhinitis heart M hyperlipidemia  urinary incontinence  IBS fibromyalgia  carpal tunnel  cervical deg disc dz  CAD - 1 vessel with med control  chronic dry eyes  sees heme for elevated lab (? what)  HTN - recent  vit B12 deficiency (shots every month) overactive bladder/ incontinence  rheum-- Dr Jon Billings  GI-- Dr Matthias Hughs  heme-- Dr Arline Asp  gyn -- Dr Greta Doom  cardiol-- Dr Katrinka Blazing  Past Surgical History: Last updated: 07/06/2008 ccy tsa hysterectomy-- all but one ovary 1969 (for bleeding, and fibroids) colonoscopy 8/07 - normal/ no polyps ,  some tics  stress test ? 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 Eyes:  Complains of eye irritation and eye pain; denies discharge and double vision. ENT:  Complains of earache, nasal congestion, and postnasal drainage; denies sore throat. CV:  Denies chest pain or discomfort and palpitations. Resp:  Denies cough and wheezing. GI:  Denies abdominal pain, change in bowel habits, and nausea. MS:  Denies joint pain and muscle aches. Derm:  Denies lesion(s), poor  wound healing, and rash. Neuro:  Complains of headaches; denies falling down, numbness, sensation of room spinning, tingling, tremors, visual disturbances, and weakness. Psych:  Denies depression. Endo:  Denies cold intolerance, excessive thirst, excessive urination, and heat intolerance. Heme:  Denies abnormal bruising and bleeding.  Physical Exam  General:  Well-developed,well-nourished,in no acute distress; alert,appropriate and cooperative throughout examination Head:  normocephalic, atraumatic, and no abnormalities observed.  tender in L temporal area and over maxillary sinus  no TMJ tenderness or clicking  no rash noted  Eyes:  vision grossly intact, pupils equal, pupils round, and pupils reactive to light.  grossly nl fundi bilat  Ears:  R ear  normal and L ear normal.   Nose:  nares boggy with clear rhinorrhea  Mouth:  pharynx pink and moist, no erythema, and no exudates.   Neck:  supple with full rom and no masses or thyromegally, no JVD or carotid bruit  Lungs:  Normal respiratory effort, chest expands symmetrically. Lungs are clear to auscultation, no crackles or wheezes. Heart:  RRR with systolic M Msk:  No deformity or scoliosis noted of thoracic or lumbar spine.  no acute joint changes  Pulses:  R and L carotid,radial,femoral,dorsalis pedis and posterior tibial pulses are full and equal bilaterally Neurologic:  cranial nerves II-XII intact, strength normal in all extremities, sensation intact to light touch, gait normal, DTRs symmetrical and normal, toes down bilaterally on Babinski, and Romberg negative.   Skin:  Intact without suspicious lesions or rashes Cervical Nodes:  No lymphadenopathy noted Psych:  slt anxoius today   Impression & Recommendations:  Problem # 1:  FACIAL PAIN (ICD-784.0) Assessment Improved is improved with prednisone- though rhinitis and sinus symptoms remain (with nl CT of sinuses) disc poss of temporal arteritis and explained this plan to ref to ENT for further eval  adv to call asap if headache returns or if any vision change Orders: ENT Referral (ENT)  Problem # 2:  RHINITIS (ICD-477.9) still troublesome with symptoms worse in L nostril and side of face see above  Orders: ENT Referral (ENT)  Complete Medication List: 1)  Ogen 1.25 1.5 Mg Tabs (Estropipate) .... One by mouth daily 2)  Omacar  .[name].. 3 tabs  by mouth daily 3)  Lipitor 20 Mg Tabs (Atorvastatin calcium) .... One by mouth daily 4)  Nexium 40 Mg Cpdr (Esomeprazole magnesium) .... One by mouth once daily 5)  Vitamin D3 1000 Unit  .... .three tabs by mouth daily 6)  Magnesium 500 Mg Tabs (Magnesium) .... One by mouth two times a day 7)  Calcium 500 Mg Tabs (Calcium carbonate) .... One by mouth three times a day 8)  Magnesium  Malate 1000mg   .... One by mouth daily 9)  Vitamin B-1 100 Mg Tabs (thiamine Hcl)  .... One by mouth daily 10)  Hyoscyamine Sulfate 1.25mg   .... 2-4 tabs by mouth daily 11)  Flexeril 10 Mg Tabs (Cyclobenzaprine hcl) .... 1/2 by mouth qhs 12)  Nystatin 100000 Unit/gm Crea (Nystatin) .... Apply small amount to affected areas once daily prn 13)  Bactroban 2 % Oint (Mupirocin) .... Apply to affected area two times a day in nose 14)  Prednisone 20 Mg Tabs (Prednisone) .... Take one by mouth daily  Patient Instructions: 1)  we will do ENT consult at check out  2)  continue the 20 mg prednisone  3)  update me asap if your headache worsens or if fever or vision change  Prescriptions: PREDNISONE 20 MG TABS (PREDNISONE)  take one by mouth daily  #30 x 0   Entered and Authorized by:   Judith Part MD   Signed by:   Judith Part MD on 02/06/2009   Method used:   Print then Give to Patient   RxID:   [id number]   Prior Medications (reviewed today): OGEN 1.25 1.5 MG TABS (ESTROPIPATE) one by mouth daily OMACAR () 3 tabs  by mouth daily LIPITOR 20 MG TABS (ATORVASTATIN CALCIUM) one by mouth daily NEXIUM 40 MG CPDR (ESOMEPRAZOLE MAGNESIUM) one by mouth once daily VITAMIN D3 1000 UNIT () .three tabs by mouth daily MAGNESIUM 500 MG TABS (MAGNESIUM) one by mouth two times a day CALCIUM 500 MG TABS (CALCIUM CARBONATE) one by mouth three times a day MAGNESIUM MALATE 1000MG  () one by mouth daily VITAMIN B-1 100 MG TABS (THIAMINE HCL) () one by mouth daily HYOSCYAMINE SULFATE  1.25MG  () 2-4 tabs by mouth daily FLEXERIL 10 MG TABS (CYCLOBENZAPRINE HCL) 1/2 by mouth qhs NYSTATIN 100000 UNIT/GM CREA (NYSTATIN) apply small amount to affected areas once daily prn BACTROBAN 2 % OINT (MUPIROCIN) apply to affected area two times a day in nose PREDNISONE 20 MG TABS (PREDNISONE) take one by mouth daily Current Allergies: ! SULFA

## 2010-02-25 NOTE — Progress Notes (Signed)
Summary: insurance wants to change from nexium  Phone Note Other Incoming   Caller: [employer] Action Taken: Software engineer of Call: Insurance company wants to change pt from nexium to one of their preferred meds, letter is on your shelf. Initial call taken by: Lowella Petties CMA,  September 14, 2009 5:06 PM  Follow-up for Phone Call        this is entirely up to the patient -ask her if she wants to switch  Follow-up by: Judith Part MD,  September 14, 2009 5:14 PM  Additional Follow-up for Phone Call Additional follow up Details #1::        Left message for patient to call back. Lewanda Rife LPN  September 14, 2009 5:17 PM   Patient notified as instructed by telephone. Pt said she would talk with Dr Milinda Antis about the Nexium when she comes in to see her 09/16/09 at 11:45am. Pt still has hacky cough and drainage and has finished antibiotic and not feeling much better.Lewanda Rife LPN  September 15, 2009 10:02 AM     Additional Follow-up for Phone Call Additional follow up Details #2::    will see her then Follow-up by: Judith Part MD,  September 15, 2009 10:02 AM  Additional Follow-up for Phone Call Additional follow up Details #3:: Details for Additional Follow-up Action Taken: Patient notified as instructed by telephone.  Sydell Axon LPN  September 15, 2009 10:51 AM

## 2010-02-25 NOTE — Assessment & Plan Note (Signed)
Summary: URINE SAMPLE  Nurse Visit   Allergies: 1)  ! Sulfa Laboratory Results   Urine Tests   Date/Time Reported: January 27, 2009 4:40 PM   Routine Urinalysis   Color: yellow Appearance: Clear Glucose: negative   (Normal Range: Negative) Bilirubin: negative   (Normal Range: Negative) Ketone: negative   (Normal Range: Negative) Spec. Gravity: <1.005   (Normal Range: 1.003-1.035) Blood: negative   (Normal Range: Negative) pH: 7.0   (Normal Range: 5.0-8.0) Protein: negative   (Normal Range: Negative) Urobilinogen: 0.2   (Normal Range: 0-1) Nitrite: negative   (Normal Range: Negative) Leukocyte Esterace: negative   (Normal Range: Negative)  Urine Microscopic WBC/HPF: 0 RBC/HPF: 0 Bacteria/HPF: 0 Mucous/HPF: few Epithelial/HPF: 1 Crystals/HPF: 0 Casts/LPF: 0 Yeast/HPF: 0 Other: 0    Comments: please let pt know UA looks negative for infection will disc further at f/u tomorrow     Advised pt. Lowella Petties CMA  January 27, 2009 5:26 PM

## 2010-02-25 NOTE — Assessment & Plan Note (Signed)
Summary: COUGH,SCRATCHY THROAT/CLE   Vital Signs:  Patient profile:   77 year old female Height:      62.5 inches Weight:      143.2 pounds BMI:     25.87 Temp:     97.6 degrees F oral Pulse rate:   88 / minute Pulse rhythm:   regular BP sitting:   120 / 70  (left arm) Cuff size:   regular  Vitals Entered By: Benny Lennert CMA Duncan Dull) (September 10, 2009 12:28 PM)  History of Present Illness: Chief complaint cough,scratchy throat and ? uti  Cough, scratchy throat- ongoing issue for past 3 years.  Seeing an ENT but still bothered by it, especially in the morning.  Cough is non productive.  No fevers, chills, wheezing or SOB.  No runny nose.  Does have allergic rhinitis, post nasal drip.  ?UTI- past three days, increased urinary frequency and suprapubic pressure.  No dysuria.  No fevers, chills, nausea or vomiting.  No back pain or incontinence.  Current Medications (verified): 1)  Ogen 1.25 1.5 Mg Tabs (Estropipate) .... One By Mouth Daily 2)  Lipitor 20 Mg Tabs (Atorvastatin Calcium) .... One By Mouth Daily 3)  Nexium 40 Mg Cpdr (Esomeprazole Magnesium) .... One By Mouth Once Daily 4)  Vitamin D3 1000 Unit .... .three Tabs By Mouth Daily 5)  Magnesium 500 Mg Tabs (Magnesium) .... One By Mouth Two Times A Day 6)  Calcium 500 Mg Tabs (Calcium Carbonate) .... One By Mouth Three Times A Day 7)  Magnesium Malate 1000mg  .... One By Mouth Daily 8)  Vitamin B-1 100 Mg Tabs (Thiamine Hcl) .... One By Mouth Daily 9)  Hyoscyamine Sulfate  1.25mg  .... 2-4 Tabs By Mouth Daily 10)  Flexeril 10 Mg Tabs (Cyclobenzaprine Hcl) .... 1/2 By Mouth Qhs 11)  Nystatin 100000 Unit/gm Crea (Nystatin) .... Apply Small Amount To Affected Areas Once Daily As Needed 12)  Bactroban 2 % Oint (Mupirocin) .... Apply To Affected Area Two Times A Day in Nose 13)  Prednisone 20 Mg Tabs (Prednisone) .... Take One By Mouth Daily 14)  Lovaza 1 Gm Caps (Omega-3-Acid Ethyl Esters) .... Take One Tablet By Mouth Three Times  Daily 15)  Cipro 500 Mg Tabs (Ciprofloxacin Hcl) .[name].. 1 By Mouth 2 Times Daily X 5 Days  Allergies: 1)  ! Sulfa  Past History:  Past Medical History: Last updated: 04/19/2009 anemia  IGg monoclonal gammopathy arthritis diverticulosis Chronic bronchitis  GERD all rhinitis heart M hyperlipidemia  urinary incontinence  IBS fibromyalgia  carpal tunnel  cervical deg disc dz  CAD - 1 vessel with med control  chronic dry eyes  sees heme for elevated lab (? what)  HTN - recent  vit B12 deficiency (shots every month) overactive bladder/ incontinence ? TIa vs migraine  carotid dopplers nl 3/11  rheum-- Dr Jon Billings  GI-- Dr Matthias Hughs  heme-- Dr Arline Asp  gyn -- Dr Greta Doom  cardiol-- Dr Katrinka Blazing  Past Surgical History: Last updated: 04/19/2009 ccy tsa hysterectomy-- all but one ovary 1969 (for bleeding, and fibroids) colonoscopy 8/07 - normal/ no polyps , some tics  stress test ? 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 ENT:  Complains of postnasal drainage; denies sinus pressure and sore throat. Resp:  Complains of cough; denies shortness of breath, sputum productive, and wheezing. GI:  Denies nausea and vomiting. GU:  Complains of urinary  frequency; denies dysuria, hematuria, incontinence, nocturia, and urinary hesitancy.  Physical Exam  General:  Well-developed,well-nourished,in no acute distress; alert,appropriate and cooperative throughout examination Ears:  R ear normal and L ear normal.   Nose:  nares boggy with clear rhinorrhea  Mouth:  pharynx pink and moist, no erythema, and no exudates.   Lungs:  Normal respiratory effort, chest expands symmetrically. Lungs are clear to auscultation, no crackles or wheezes. Heart:  RRR with systolic M Abdomen:  very mild suprapubic tenderness without rebound or gaurding   Extremities:  No clubbing, cyanosis, edema, or deformity noted with normal full range of motion of all joints.   Psych:  Cognition and judgment appear intact. Alert and cooperative with normal attention span and concentration. No apparent delusions, illusions, hallucinations   Impression & Recommendations:  Problem # 1:  RHINITIS (ICD-477.9) Assessment Deteriorated Discussed trying stronger allergy medication.  Currently only taking Benadryl as needed.  Advised Allegra.  Pt agreed with plan.  No signs of sinusitis, bronchitis, or pneumonia on physical exam today.  Problem # 2:  FREQUENCY, URINARY (ICD-788.41) Assessment: New UA neg, since we are heading into the weekend, will treat with cipro and send urine for culture. Orders: UA Dipstick w/o Micro (manual) ([address]) T-Culture, Urine ([id number]) Prescription Created Electronically [phone])  Complete Medication List: 1)  Ogen 1.25 1.5 Mg Tabs (Estropipate) .... One by mouth daily 2)  Lipitor 20 Mg Tabs (Atorvastatin calcium) .... One by mouth daily 3)  Nexium 40 Mg Cpdr (Esomeprazole magnesium) .... One by mouth once daily 4)  Vitamin D3 1000 Unit  .... .three tabs by mouth daily 5)  Magnesium 500 Mg Tabs (Magnesium) .... One by mouth two times a day 6)  Calcium 500 Mg Tabs (Calcium carbonate) .... One by mouth three times a day 7)  Magnesium Malate  1000mg   .... One by mouth daily 8)  Vitamin B-1 100 Mg Tabs (thiamine Hcl)  .... One by mouth daily 9)  Hyoscyamine Sulfate 1.25mg   .... 2-4 tabs by mouth daily 10)  Flexeril 10 Mg Tabs (Cyclobenzaprine hcl) .... 1/2 by mouth qhs 11)  Nystatin 100000 Unit/gm Crea (Nystatin) .... Apply small amount to affected areas once daily as needed 12)  Bactroban 2 % Oint (Mupirocin) .... Apply to affected area two times a day in nose 13)  Prednisone 20 Mg Tabs (Prednisone) .... Take one by mouth daily 14)  Lovaza 1 Gm Caps (Omega-3-acid ethyl esters) .... Take one tablet by mouth three times daily 15)  Cipro 500 Mg Tabs (Ciprofloxacin hcl) .[name].. 1 by mouth 2 times daily x 5 days Prescriptions: CIPRO 500 MG TABS (CIPROFLOXACIN HCL) 1 by mouth 2 times daily x 5 days  #10 x 0   Entered and Authorized by:  Ruthe Mannan MD   Signed by:   Ruthe Mannan MD on 09/10/2009   Method used:   Electronically to        [facility]* (retail)       [address]       [place], [place]  [address]       Ph: [phone]       Fax: [phone]   RxID:   [id number]   Current Allergies (reviewed today): ! SULFA  Laboratory Results   Urine Tests  Date/Time Received: September 10, 2009 12:35 PM   Routine Urinalysis   Color: yellow Appearance: Cloudy Glucose: negative   (Normal Range: Negative) Bilirubin: negative   (Normal Range: Negative) Ketone: negative   (Normal Range: Negative) Spec. Gravity: 1.010   (Normal Range: 1.003-1.035) Blood: negative   (Normal Range: Negative) pH: 6.0   (Normal Range: 5.0-8.0) Protein: negative   (Normal Range: Negative) Urobilinogen: 0.2   (Normal Range: 0-1) Nitrite: negative   (Normal Range: Negative) Leukocyte Esterace: negative   (Normal Range: Negative)        Appended Document: COUGH,SCRATCHY THROAT/CLE

## 2010-02-25 NOTE — Assessment & Plan Note (Signed)
Summary: ROA FOR 1 MONTH FOLLOW-UP/JRR   Vital Signs:  Patient profile:   77 year old female Height:      62.5 inches Weight:      145.50 pounds BMI:     26.28 Temp:     97.9 degrees F oral Pulse rate:   80 / minute Pulse rhythm:   regular BP sitting:   118 / 76  (left arm) Cuff size:   regular  Vitals Entered By: Lewanda Rife LPN (October 12, 2009 4:08 PM) CC: one month f/u   History of Present Illness: here for f/u of multiple health problems incl gerd/ sinusitis/ cough and gerd as well as persistant neuropathy symptoms   wt is stable   bp 118/76- good   tx for sinusitis last visit and adv to return to ENT thinks that helped  ENT told her he could not do anything else for her -- ? thinks that drainage is so severe that this is cause of the cough , did not talk about reflux nettti pot helps a little   she wants a ref to pulmonary doctor next   changed to protonix for GERD -- and actually ended up able to get nexium -- and this helps the gerd symptoms  only problem is with overeating    lab done for neuropathy incl nl B12/ sed rate/ glucose  has had this over a year  some days are worse than others bottom of feet feel numb and cold all the time  no burning or stinging or hurt  if she keeps them warm -- that does not help the numbness      Allergies: 1)  ! Sulfa  Past History:  Past Medical History: Last updated: 04/19/2009 anemia  IGg monoclonal gammopathy arthritis diverticulosis Chronic bronchitis  GERD all rhinitis heart M hyperlipidemia  urinary incontinence  IBS fibromyalgia  carpal tunnel  cervical deg disc dz  CAD - 1 vessel with med control  chronic dry eyes  sees heme for elevated lab (? what)  HTN - recent  vit B12 deficiency (shots every month) overactive bladder/ incontinence ? TIa vs migraine  carotid dopplers nl 3/11  rheum-- Dr Jon Billings  GI-- Dr Matthias Hughs  heme-- Dr Arline Asp  gyn -- Dr Greta Doom  cardiol-- Dr Katrinka Blazing  Past  Surgical History: Last updated: 04/19/2009 ccy tsa hysterectomy-- all but one ovary 1969 (for bleeding, and fibroids) colonoscopy 8/07 - normal/ no polyps , some tics  stress test ? 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 Eyes:  Denies blurring and eye irritation. CV:  Denies chest pain or discomfort, palpitations, and shortness of breath with exertion. Resp:  Complains of cough; denies shortness of breath and wheezing. GI:  Denies abdominal pain, change in bowel habits, indigestion, and nausea. GU:  Denies dysuria. MS:  Denies cramps and muscle weakness. Derm:  Denies itching, lesion(s), poor wound healing, and rash. Neuro:  Complains of numbness and tingling; denies headaches and weakness. Psych:  mood is ok. Endo:  Denies excessive thirst and excessive urination. Heme:  Denies abnormal bruising and bleeding.  Physical Exam  Neurologic:  nl sens of lt touch all over foot  dec sens to sharp --over balls of feet only   Impression & Recommendations:  Problem # 1:  COUGH (ICD-786.2) Assessment Unchanged chronic cough with the sensation of post nasal drip- not helped by antihistmines and also with neg ent w/u tx for gerd with only slt  imp ref to pulm at pt request Orders: Pulmonary Referral (Pulmonary)  Problem # 2:  PERIPHERAL NEUROPATHY, FEET (ICD-356.9) Assessment: Unchanged ongoing and not that bothersome rev nl labs with pt  will continue to watch- good foot care stressed if worse will consider ncv and emg   Complete Medication List: 1)  Ogen 1.25 1.5 Mg Tabs (Estropipate) .... One by mouth daily 2)  Lipitor 20 Mg Tabs (Atorvastatin calcium) .... One by mouth daily 3)  Protonix 40 Mg Tbec (Pantoprazole sodium) .[name].. 1 by mouth once daily 4)  Vitamin D3 1000 Unit  .... .three tabs by mouth daily 5)  Magnesium 500 Mg Tabs (Magnesium) .... One by mouth two times a day 6)  Calcium 500 Mg Tabs (Calcium carbonate) .... One by mouth three times a day 7)  Vitamin B-1 100 Mg Tabs (thiamine Hcl)  .... One by mouth daily 8)  Hyoscyamine Sulfate 1.25mg   .... 2-4 tabs by mouth daily 9)  Flexeril 10 Mg Tabs (Cyclobenzaprine hcl) .... 1/2 by mouth qhs 10)  Nystatin 100000 Unit/gm Crea (Nystatin) .... Apply small amount to affected areas once daily as needed 11)  Bactroban 2 % Oint (Mupirocin) .... Apply to affected area two times a day in nose as needed. 12)  Lovaza 1 Gm Caps (Omega-3-acid ethyl esters) .... Take one tablet by mouth three times daily 13)  Zyrtec Allergy 10 Mg Tabs (Cetirizine hcl) .... Otc as directed. 14)  Nexium 40 Mg Cpdr (Esomeprazole magnesium) .... Take 1 capsule by mouth once a day before breakfast. 15)  Netty Pot  .... Otc as directed.  Patient Instructions: 1)  we will do referral to pulmonary at check out  2)  if your foot numbness worsens- please let me know and we can send you for some tests  3)  check your feet several times a day and moisturize   Current Allergies (reviewed today): ! SULFA

## 2010-02-25 NOTE — Progress Notes (Signed)
Summary: prior auth given for pantoprazole  Phone Note From Pharmacy   Caller: [facility]*/ FEP Summary of Call: Prior auth approval given over the phone for pantoprazole.  Advised pharmacy.                   Lowella Petties CMA  September 17, 2009 8:17 AM      Appended Document: prior auth given for pantoprazole Prior auth letter approving pantoprazole placed on doctors desk for signature and scanning.

## 2010-02-25 NOTE — Assessment & Plan Note (Signed)
Summary: HACKY COUGH, NO BETTER/RI   Vital Signs:  Patient profile:   77 year old female Height:      62.5 inches Weight:      145.25 pounds BMI:     26.24 Temp:     97.7 degrees F oral Pulse rate:   88 / minute Pulse rhythm:   regular BP sitting:   126 / 74  (left arm) Cuff size:   regular  Vitals Entered By: Lewanda Rife LPN (September 16, 2009 3:55 PM) CC: hacky cough not much better and drainage at back of throat. Broken blood vessell in rt eye.   History of Present Illness: here for ongoing hacky cough  per hx- has had this for 3 y saw ent in [place]  dx with post nasal drip and hoarseness  tx with atrovent spray had septal dev and some vocal cord bowing  the atrovent never helped much / just a little -- still using atrovent  still uses sinus rinse  she never went back- was too frustrated ? should she go back   last visit here stepped up her antihistamine to allegra from as needed benadryl  she changed to zyrtec -- more effective  is a little better from that   is having some L sided headache and neck soreness  is on cipro for bladder infection and the thinks it has helped her sinuses  coughing and spitting green/ yellow drainage-- not blowing anything out  nose feels congested   has terrible taste in her mouth also some irritation in L side of throat   nexium is not working well enough  uses zantac on top of it  wants to try something else   feet stay numb bottom of feet - 1-2 years toes spasm        Allergies: 1)  ! Sulfa  Past History:  Past Medical History: Last updated: 04/19/2009 anemia  IGg monoclonal gammopathy arthritis diverticulosis Chronic bronchitis  GERD all rhinitis heart M hyperlipidemia  urinary incontinence  IBS fibromyalgia  carpal tunnel  cervical deg disc dz  CAD - 1 vessel with med control  chronic dry eyes  sees heme for elevated lab (? what)  HTN - recent  vit B12 deficiency (shots every month) overactive bladder/  incontinence ? TIa vs migraine  carotid dopplers nl 3/11  rheum-- Dr Jon Billings  GI-- Dr Matthias Hughs  heme-- Dr Arline Asp  gyn -- Dr Greta Doom  cardiol-- Dr Katrinka Blazing  Past Surgical History: Last updated: 04/19/2009 [facility] tsa hysterectomy-- all but one ovary 1969 (for bleeding, and fibroids) colonoscopy 8/07 - normal/ no polyps , some tics  stress test ? 08 cardiac cath 2/10 neg (per pt)- Dr Katrinka Blazing dexa - normal 5/10 hosp 3/11 -- TIA vs poss migraine -  nl scans and no carotid stenosis  Family History: Last updated: 01/30/2009 mother - died with diverticulosis , OP  father - killed in accident at age 66, CAD  P aunts and uncles with heart dz  2 P aunts with breast cancer  M aunts and uncles with CAD   3 brothers -- CAD 1 brother - prostate ca  1 brother- leukemia  grand daughter- bipolar   Social History: Last updated: 2009-01-30 retired post [employer], [employer] widowed oldest of 3 sibs/ brothers  floor exercises / stretches  cares for 2 acre yard -all year around  has bipolar grandaughter caring for child-- this is very stressful for her   Review of Systems General:  Denies fatigue, fever,  loss of appetite, and malaise. Eyes:  Denies blurring and eye irritation. ENT:  Complains of nasal congestion, postnasal drainage, sinus pressure, and sore throat; denies earache. CV:  Denies chest pain or discomfort, lightheadness, and palpitations. Resp:  Complains of cough; denies pleuritic, shortness of breath, sputum productive, and wheezing. GI:  Complains of indigestion; denies abdominal pain, change in bowel habits, nausea, and vomiting. GU:  Denies dysuria and urinary frequency. MS:  Complains of cramps; denies muscle aches and muscle weakness. Derm:  Denies itching, lesion(s), poor wound healing, and rash. Neuro:  Denies numbness and tingling. Psych:  Denies anxiety and depression. Endo:  Denies cold intolerance, excessive thirst, excessive urination, and heat intolerance. Heme:   Denies abnormal bruising, bleeding, and enlarge lymph nodes.  Physical Exam  General:  Well-developed,well-nourished,in no acute distress; alert,appropriate and cooperative throughout examination Head:  normocephalic, atraumatic, and no abnormalities observed.  tender over L ethmoid and maxillary sinuses  no temporal tenderness Eyes:  vision grossly intact, pupils equal, pupils round, and pupils reactive to light.  subconj hemorrhage on R lat eye- nl rom and no other changes  Ears:  R ear normal and L ear normal.   Nose:  some mild nasal congestion and injection Mouth:  pharynx pink and moist, no erythema, and no exudates.   Neck:  No deformities, masses, or tenderness noted. Chest Wall:  No deformities, masses, or tenderness noted. Lungs:  Normal respiratory effort, chest expands symmetrically. Lungs are clear to auscultation, no crackles or wheezes. Heart:  RRR with systolic M Abdomen:  Bowel sounds positive,abdomen soft and non-tender without masses, organomegaly or hernias noted. no renal bruits  Msk:  No deformity or scoliosis noted of thoracic or lumbar spine.   Pulses:  R and L carotid,radial,femoral,dorsalis pedis and posterior tibial pulses are full and equal bilaterally Extremities:  No clubbing, cyanosis, edema, or deformity noted with normal full range of motion of all joints.   Neurologic:  decreased sensation to soft touch and temp in toes and ant 1/2 of plantar surf of feet bilat  strength normal in all extremities, gait normal, and DTRs symmetrical and normal.   Skin:  Intact without suspicious lesions or rashes Cervical Nodes:  No lymphadenopathy noted Inguinal Nodes:  No significant adenopathy Psych:  normal affect, talkative and pleasant    Impression & Recommendations:  Problem # 1:  COUGH (ICD-786.2) Assessment Unchanged ongoing for 3 years from post nasal drip  will tx sinusitis  ref back to ENT for f/u as well Orders: ENT Referral (ENT) Prescription Created  Electronically [phone])  Problem # 2:  SINUSITIS - ACUTE-NOS (ICD-461.9) Assessment: New  improved with cipro - will ext course for 7 days update if no resolution in headache expectorant and saline as needed  f/u ent The following medications were removed from the medication list:    Cipro 500 Mg Tabs (Ciprofloxacin hcl) .[name]... 1 by mouth 2 times daily x 5 days Her updated medication list for this problem includes:    Cipro 500 Mg Tabs (Ciprofloxacin hcl) .[name]... 1 by mouth two times a day for 7 days  Orders: Prescription Created Electronically [phone])  Problem # 3:  GERD (ICD-530.81) Assessment: Deteriorated  per pt not good control with nexium will change to protonix which would also be cheaper-- and update ? if gerd plays role in her cough Her updated medication list for this problem includes:    Protonix 40 Mg Tbec (Pantoprazole sodium) .[name]... 1 by mouth once daily  Orders: Prescription Created  Electronically [phone])  Problem # 4:  PERIPHERAL NEUROPATHY, FEET (ICD-356.9) Assessment: New this is new to me but going on several years  check neuropathy labs f/u 1 mo  Orders: Venipuncture (60454) TLB-B12 + Folate Pnl (09811_91478-G95/AOZ) TLB-Renal Function Panel (80069-RENAL) TLB-CBC Platelet - w/Differential (85025-CBCD) TLB-TSH (Thyroid Stimulating Hormone) (84443-TSH) TLB-Sedimentation Rate (ESR) (85652-ESR)  Complete Medication List: 1)  Ogen 1.25 1.5 Mg Tabs (Estropipate) .... One by mouth daily 2)  Lipitor 20 Mg Tabs (Atorvastatin calcium) .... One by mouth daily 3)  Protonix 40 Mg Tbec (Pantoprazole sodium) .[name].. 1 by mouth once daily 4)  Vitamin D3 1000 Unit  .... .three tabs by mouth daily 5)  Magnesium 500 Mg Tabs (Magnesium) .... One by mouth two times a day 6)  Calcium 500 Mg Tabs (Calcium carbonate) .... One by mouth three times a day 7)  Magnesium Malate 1000mg   .... One by mouth daily 8)  Vitamin B-1 100 Mg Tabs (thiamine Hcl)  .... One by mouth daily 9)   Hyoscyamine Sulfate 1.25mg   .... 2-4 tabs by mouth daily 10)  Flexeril 10 Mg Tabs (Cyclobenzaprine hcl) .... 1/2 by mouth qhs 11)  Nystatin 100000 Unit/gm Crea (Nystatin) .... Apply small amount to affected areas once daily as needed 12)  Bactroban 2 % Oint (Mupirocin) .... Apply to affected area two times a day in nose as needed. 13)  Lovaza 1 Gm Caps (Omega-3-acid ethyl esters) .... Take one tablet by mouth three times daily 14)  Zyrtec Allergy 10 Mg Tabs (Cetirizine hcl) .... Otc as directed. 15)  Cipro 500 Mg Tabs (Ciprofloxacin hcl) .[name].. 1 by mouth two times a day for 7 days  Patient Instructions: 1)  take the cipro for sinus infection 2)  we will do ENT referral at check out for cough  3)  change from nexium to protonix for acid reflux-- and let me know if it does not work 4)  labs today for neuropathy 5)  follow up in 1 months  Prescriptions: BACTROBAN 2 % OINT (MUPIROCIN) apply to affected area two times a day in nose as needed.  #1 medium x 1   Entered and Authorized by:   Judith Part MD   Signed by:   Judith Part MD on 09/16/2009   Method used:   Electronically to        [facility]* (retail)       [address]       [place], [place]  [address]       Ph: [phone]       Fax: [phone]   RxID:   [id number] PROTONIX 40 MG TBEC (PANTOPRAZOLE SODIUM) 1 by mouth once daily  #30 x 1   Entered and Authorized by:   Judith Part MD   Signed by:   Judith Part MD on 09/16/2009   Method used:   Electronically to        [facility]* (retail)       [address]       [place], [place]  [address]       Ph: [phone]       Fax: [phone]   RxID:   [id number] CIPRO 500 MG TABS (CIPROFLOXACIN HCL) 1 by mouth two times a day for 7 days  #14 x 0   Entered and Authorized by:   Judith Part MD   Signed by:   Judith Part MD on 09/16/2009   Method used:   Electronically to  [facility]* (retail)       [address]        [place], [place]  [address]       Ph: [phone]       Fax: [phone]   RxID:   [id number]   Current Allergies (reviewed today): ! SULFA

## 2010-02-25 NOTE — Miscellaneous (Signed)
Summary: med list update  Clinical Lists Changes  Medications: Added new medication of PREDNISONE 20 MG TABS (PREDNISONE) take one by mouth daily x 14 days     Prior Medications: OGEN 1.25 1.5 MG TABS (ESTROPIPATE) one by mouth daily OMACAR () 3 tabs  by mouth daily LIPITOR 20 MG TABS (ATORVASTATIN CALCIUM) one by mouth daily NEXIUM 40 MG CPDR (ESOMEPRAZOLE MAGNESIUM) one by mouth once daily VITAMIN D3 1000 UNIT () .three tabs by mouth daily MAGNESIUM 500 MG TABS (MAGNESIUM) one by mouth two times a day CALCIUM 500 MG TABS (CALCIUM CARBONATE) one by mouth three times a day MAGNESIUM MALATE 1000MG  () one by mouth daily VITAMIN B-1 100 MG TABS (THIAMINE HCL) () one by mouth daily HYOSCYAMINE SULFATE  1.25MG  () 2-4 tabs by mouth daily FLEXERIL 10 MG TABS (CYCLOBENZAPRINE HCL) 1/2 by mouth qhs NYSTATIN 100000 UNIT/GM CREA (NYSTATIN) apply small amount to affected areas once daily prn BACTROBAN 2 % OINT (MUPIROCIN) apply to affected area two times a day in nose METROGEL-VAGINAL 0.75 % GEL (METRONIDAZOLE) 1 applicator intravaginally at bedtime for 7 days DIFLUCAN 150 MG TABS (FLUCONAZOLE) 1 by mouth times one now Current Allergies: ! SULFA

## 2010-02-25 NOTE — Progress Notes (Signed)
Summary: refill request from [facility]  Phone Note Refill Request Message from:  Fax from Pharmacy  Refills Requested: Medication #1:  fluticasone   Last Refilled: 12/04/2007 Faxed request from [facility], this is no longer on med list.  Initial call taken by: Lowella Petties CMA, AAMA,  November 30, 2009 3:27 PM  Follow-up for Phone Call        please verify with pt since not on list  Follow-up by: Judith Part MD,  November 30, 2009 4:11 PM  Additional Follow-up for Phone Call Additional follow up Details #1::        Pt has already left [facility]. Left message on pt's home phone for patient to call back. Lewanda Rife LPN  November 30, 2009 4:20 PM   Patient called back this morning. says that she is not using the fluticasone.  Additional Follow-up by: Melody Comas,  December 01, 2009 9:47 AM

## 2010-02-25 NOTE — Progress Notes (Signed)
Summary: refill request for nystatin cream  Phone Note Refill Request Message from:  Fax from Pharmacy  Refills Requested: Medication #1:  NYSTATIN 100000 UNIT/GM CREA apply small amount to affected areas once daily prn   Last Refilled: 06/11/2008 Faxed request from [facility], [phone].   Initial call taken by: Lowella Petties CMA,  August 05, 2009 11:35 AM  Follow-up for Phone Call        px written on EMR for call in  Follow-up by: Judith Part MD,  August 05, 2009 1:50 PM  Additional Follow-up for Phone Call Additional follow up Details #1::        Rx called to pharmacy Additional Follow-up by: Linde Gillis CMA Duncan Dull),  August 05, 2009 3:47 PM    New/Updated Medications: NYSTATIN 100000 UNIT/GM CREA (NYSTATIN) apply small amount to affected areas once daily as needed Prescriptions: NYSTATIN 100000 UNIT/GM CREA (NYSTATIN) apply small amount to affected areas once daily as needed  #1 small x 1   Entered and Authorized by:   Judith Part MD   Signed by:   Judith Part MD on 08/05/2009   Method used:   Telephoned to ...       [facility]* (retail)       [address]       [place], [place]  [address]       Ph: [phone]       Fax: [phone]   RxID:   [id number]

## 2010-02-25 NOTE — Progress Notes (Signed)
Summary: refill request for flexeril  Phone Note Refill Request Message from:  Fax from Pharmacy  Refills Requested: Medication #1:  FLEXERIL 10 MG TABS 1/2 by mouth qhs Phoned request from [facility].  Initial call taken by: Lowella Petties CMA,  Jun 03, 2009 11:26 AM  Follow-up for Phone Call        I do not have any prev refils of that from [place] before  ? was it coming from another Dr? - please clarify Follow-up by: Judith Part MD,  Jun 03, 2009 11:31 AM  Additional Follow-up for Phone Call Additional follow up Details #1::        Spoke with pt, she say she got this from Dr Jon Billings, she takes this for fibromyalgia.  [facility] said they had faxed refill form to that office but were told they would need to fax to PCP.    Lowella Petties CMA  Jun 03, 2009 2:11 PM  if she still sees Dr Jon Billings- it needs to come from her .... if not let me know  please send for last note  Additional Follow-up by: Judith Part MD,  Jun 03, 2009 3:35 PM    Additional Follow-up for Phone Call Additional follow up Details #2::    Pt isnt going to Dr. Jon Billings anymore, she is seeing someone in [place] that specializes in arthritis, [facility].  She is going there tomorrow and will ask the doctor there for the flexeril script.    Lowella Petties CMA  Jun 03, 2009 4:03 PM

## 2010-04-06 ENCOUNTER — Telehealth: Payer: Self-pay | Admitting: Family Medicine

## 2010-04-13 NOTE — Progress Notes (Signed)
Summary: refill request for lovaza  Phone Note Refill Request Message from:  Fax from Pharmacy  Refills Requested: Medication #1:  LOVAZA 1 GM CAPS take one tablet by mouth three times daily Faxed form from [facility] is on your shelf.  Initial call taken by: Lowella Petties CMA, AAMA,  April 06, 2010 8:28 AM  Follow-up for Phone Call        completed form with rx faxed to [phone] as instructed.Lewanda Rife LPN  April 06, 2010 11:12 AM     New/Updated Medications: LOVAZA 1 GM CAPS (OMEGA-3-ACID ETHYL ESTERS) take one tablet by mouth three times daily Prescriptions: LOVAZA 1 GM CAPS (OMEGA-3-ACID ETHYL ESTERS) take one tablet by mouth three times daily  #270 x 3   Entered by:   Lewanda Rife LPN   Authorized by:   Judith Part MD   Signed by:   Lewanda Rife LPN on 11/20/2534   Method used:   Print then Give to Patient   RxID:   [id number]

## 2010-04-16 LAB — URINALYSIS, ROUTINE W REFLEX MICROSCOPIC
Nitrite: NEGATIVE
Specific Gravity, Urine: 1.007 (ref 1.005–1.030)
Urobilinogen, UA: 0.2 mg/dL (ref 0.0–1.0)

## 2010-04-16 LAB — RAPID URINE DRUG SCREEN, HOSP PERFORMED
Amphetamines: NOT DETECTED
Opiates: NOT DETECTED
Tetrahydrocannabinol: NOT DETECTED

## 2010-04-16 LAB — LIPID PANEL
LDL Cholesterol: 67 mg/dL (ref 0–99)
VLDL: 16 mg/dL (ref 0–40)

## 2010-04-16 LAB — CK TOTAL AND CKMB (NOT AT ARMC)
CK, MB: 1.3 ng/mL (ref 0.3–4.0)
CK, MB: 1.4 ng/mL (ref 0.3–4.0)
CK, MB: 1.4 ng/mL (ref 0.3–4.0)
Relative Index: INVALID (ref 0.0–2.5)
Total CK: 51 U/L (ref 7–177)
Total CK: 55 U/L (ref 7–177)
Total CK: 59 U/L (ref 7–177)
Total CK: 59 U/L (ref 7–177)

## 2010-04-16 LAB — DIFFERENTIAL
Basophils Relative: 1 % (ref 0–1)
Eosinophils Absolute: 0.1 10*3/uL (ref 0.0–0.7)
Monocytes Absolute: 0.5 10*3/uL (ref 0.1–1.0)
Monocytes Relative: 7 % (ref 3–12)

## 2010-04-16 LAB — TROPONIN I
Troponin I: 0.01 ng/mL (ref 0.00–0.06)
Troponin I: <0.01 ng/mL (ref 0.00–0.06)

## 2010-04-16 LAB — COMPREHENSIVE METABOLIC PANEL
ALT: 21 U/L (ref 0–35)
AST: 25 U/L (ref 0–37)
Albumin: 3.6 g/dL (ref 3.5–5.2)
Alkaline Phosphatase: 68 U/L (ref 39–117)
GFR calc Af Amer: >60 mL/min (ref >60)
Potassium: 3.8 mEq/L (ref 3.5–5.1)
Sodium: 134 mEq/L — ABNORMAL LOW (ref 135–145)
Total Protein: 7.4 g/dL (ref 6.0–8.3)

## 2010-04-16 LAB — CBC
Hemoglobin: 13.5 g/dL (ref 12.0–15.0)
MCHC: 33.7 g/dL (ref 30.0–36.0)
RBC: 4.63 MIL/uL (ref 3.87–5.11)

## 2010-04-16 LAB — PROTIME-INR
INR: 0.9 (ref 0.00–1.49)
Prothrombin Time: 12.1 seconds (ref 11.6–15.2)

## 2010-04-16 LAB — CULTURE, BLOOD (ROUTINE X 2): Culture: NO GROWTH

## 2010-04-16 LAB — TSH: TSH: 1.957 u[IU]/mL (ref 0.350–4.500)

## 2010-05-11 LAB — COMPREHENSIVE METABOLIC PANEL
ALT: 19 U/L (ref 0–35)
AST: 25 U/L (ref 0–37)
CO2: 22 mEq/L (ref 19–32)
Calcium: 8.5 mg/dL (ref 8.4–10.5)
Chloride: 99 mEq/L (ref 96–112)
GFR calc Af Amer: >60 mL/min (ref >60)
GFR calc non Af Amer: >60 mL/min (ref >60)
Glucose, Bld: 105 mg/dL — ABNORMAL HIGH (ref 70–99)
Sodium: 131 mEq/L — ABNORMAL LOW (ref 135–145)
Total Bilirubin: 0.7 mg/dL (ref 0.3–1.2)

## 2010-05-11 LAB — CARDIAC PANEL(CRET KIN+CKTOT+MB+TROPI)
Relative Index: INVALID (ref 0.0–2.5)
Troponin I: 0.04 ng/mL (ref 0.00–0.06)
Troponin I: 0.11 ng/mL — ABNORMAL HIGH (ref 0.00–0.06)

## 2010-05-11 LAB — DIFFERENTIAL
Lymphs Abs: 2.2 10*3/uL (ref 0.7–4.0)
Monocytes Relative: 6 % (ref 3–12)
Neutro Abs: 4.9 10*3/uL (ref 1.7–7.7)
Neutrophils Relative %: 63 % (ref 43–77)

## 2010-05-11 LAB — CBC
MCHC: 34.6 g/dL (ref 30.0–36.0)
Platelets: 251 10*3/uL (ref 150–400)
Platelets: 262 10*3/uL (ref 150–400)
RBC: 4.25 MIL/uL (ref 3.87–5.11)
RBC: 4.67 MIL/uL (ref 3.87–5.11)
WBC: 7.8 10*3/uL (ref 4.0–10.5)
WBC: 8.9 10*3/uL (ref 4.0–10.5)

## 2010-05-11 LAB — POCT I-STAT, CHEM 8
BUN: 24 mg/dL — ABNORMAL HIGH (ref 6–23)
Chloride: 102 mEq/L (ref 96–112)
Creatinine, Ser: 1.2 mg/dL (ref 0.4–1.2)
HCT: 42 % (ref 36.0–46.0)
Hemoglobin: 12.6 g/dL (ref 12.0–15.0)
Sodium: 129 mEq/L — ABNORMAL LOW (ref 135–145)
Sodium: 130 mEq/L — ABNORMAL LOW (ref 135–145)
TCO2: 23 mmol/L (ref 0–100)
TCO2: 24 mmol/L (ref 0–100)

## 2010-05-11 LAB — POCT CARDIAC MARKERS
Myoglobin, poc: 89.7 ng/mL (ref 12–200)
Myoglobin, poc: 91.4 ng/mL (ref 12–200)
Troponin i, poc: <0.05 ng/mL (ref 0.00–0.09)
Troponin i, poc: <0.05 ng/mL (ref 0.00–0.09)

## 2010-05-11 LAB — PROTIME-INR
INR: 0.9 (ref 0.00–1.49)
Prothrombin Time: 12.3 seconds (ref 11.6–15.2)

## 2010-05-11 LAB — CK TOTAL AND CKMB (NOT AT ARMC): Relative Index: INVALID (ref 0.0–2.5)

## 2010-06-08 NOTE — Cardiovascular Report (Signed)
NAMESHATERA, RENNERT                ACCOUNT NO.:  [id number]   MEDICAL RECORD NO.:  [id number]          PATIENT TYPE:  INP   LOCATION:  4707                         FACILITY:  [facility]   PHYSICIAN:  Lyn Records, M.D.   DATE OF BIRTH:  [birth year]   DATE OF PROCEDURE:  03/20/2008  DATE OF DISCHARGE:  03/20/2008                            CARDIAC CATHETERIZATION   INDICATION FOR THIS PROCEDURE:  Prolonged chest discomfort with 2  consecutive troponin I elevations.  The patient has a chronic left  bundle-branch block and prior documentation of mild to moderate coronary  disease involving the LAD.   PROCEDURES PERFORMED:  1. Left heart catheterization.  2. Selective coronary angiography.  3. Left ventriculography.   DESCRIPTION:  A 6-French sheath was placed in the right femoral artery  using the modified Seldinger technique.  This was done after the  administration of Versed and fentanyl, 2 mg and 50 mcg respectively.  A  1% Xylocaine was used for local anesthesia.   A 6-French A2 multipurpose catheter was used for hemodynamic recordings,  left ventriculography by hand injection, and attempted coronary  angiography.  Preformed Judkins #4 6-French left and right coronary  catheters were used for angiography of the left and right system  respectively.  The patient tolerated the procedure without  complications.  Sheath entry site was not acceptable for percutaneous  closure with Angio-Seal.  Manual compression was used with success.   RESULTS:  1. Hemodynamic data:      a.     Aortic pressure 114/50.      b.     Left ventricular pressure 116/8 mmHg.  2. Left ventriculography:  There is distal anterior wall apical and      inferior wall dyssynergy.  Overall, left ventricular function is      normal.  The ejection fraction is 60%.  No mitral regurgitation is      noted.  3. Coronary angiography:      a.     Left main coronary:  Widely patent.      b.     Left anterior descending  coronary:  Mild calcification is       noted.  Diffuse luminal irregularities are noted in the proximal       and mid LAD but with no obstruction greater than 50%.  The LAD       wraps around the left ventricular apex.  The region of previous 50-       70% narrowing in the mid LAD within a tortuous segment has       resolved.  TIMI grade 3 flow was noted throughout the LAD at       around the apex.      c.     The circumflex artery:  The circumflex coronary artery is a       large vessel that gives origin to 4 distal obtuse marginal       branches.  Irregularities are noted in the proximal circumflex.       Up to 30% narrowing is seen.  No high-grade obstruction  is noted.      d.     Right coronary:  The right coronary artery gives origin to       an A-V nodal artery, PDA, and left ventricular branches.       Irregularities are noted in the midvessel.  Flow was brisk.  No       significant obstruction is seen.   CONCLUSIONS:  1. Widely patent coronaries throughout all arterial trees.  There are      luminal irregularities noted in the right coronary in proximal and      mid segment, the proximal left anterior descending, as well as the      mid left anterior descending, and the mid circumflex.  No      significant/obstructive coronary lesions are noted.  2. Overall normal left ventricular function but with apical and      anteroseptal dyssynergy secondary to left bundle-branch block.   RECOMMENDATIONS:  Risk factor modification.  Home later today.      Lyn Records, M.D.  Electronically Signed     HWS/MEDQ  D:  03/20/2008  T:  03/20/2008  Job:  [id number]   cc:   Marne A. Milinda Antis, MD

## 2010-06-08 NOTE — Discharge Summary (Signed)
[name], VIAR                ACCOUNT NO.:  [id number]   MEDICAL RECORD NO.:  [id number]          PATIENT TYPE:  INP   LOCATION:  4707                         FACILITY:  [facility]   PHYSICIAN:  Lyn Records, M.D.   DATE OF BIRTH:  06-22-1928   DATE OF ADMISSION:  03/19/2008  DATE OF DISCHARGE:  03/20/2008                               DISCHARGE SUMMARY   DISCHARGE DIAGNOSES:  1. Chest pain, noncardiac in nature.  2. Nonobstructive coronary artery disease.  3. Hypertension.  4. Left bundle-branch block.  5. Hyperlipidemia.  6. Fibromyalgia.  7. Gastroesophageal reflux disease.  8. Allergy to SULFA and ASPIRIN.   HOSPITAL COURSE:  [name] is a 77 year old female, who had  prolonged lower sternal chest pressure lasting greater than 2 hours.  She presented to the emergency room and the discomfort eventually  relieved with sublingual nitroglycerin.  She was admitted to the  hospital and because of her abnormal EKG and minimally elevated troponin  of 0.15, we planned for cardiac catheterization.   The catheterization showed less than 50% stenosis in the proximal and  mid LAD with all other vessels being patent.  LV function showed  dyssynergy.  The patient was felt to be stable enough to go home the  same day as cardiac catheterization.   Dr. Katrinka Blazing did want to go ahead and place her on:  1. Plavix 75 mg a day.  2. Lipitor 20 mg a day.  3. Nexium 40 mg a day.  4. Norvasc 5 mg a day.  5. Diovan 160 mg a day.  6. Cyclobenzaprine 10 mg a day.   LABORATORY STUDIES:  Hemoglobin 12.5, hematocrit 36.1, white count 8.9,  and platelets 251.  TSH 1.745.  Sodium 131, potassium 3.5, BUN 19, and  creatinine 0.85.   The patient is to remain on a low-sodium heart-healthy diet.  Avoid  Celebrex.  Increase activity slowly.  Follow up with Dr. Effie Shy, nurse practitioner, on April 08, 2008, at 2:40 p.m.      Guy Franco, P.A.      Lyn Records, M.D.  Electronically Signed    LB/MEDQ  D:  04/30/2008  T:  05/01/2008  Job:  [id number]

## 2010-06-10 ENCOUNTER — Other Ambulatory Visit (HOSPITAL_COMMUNITY): Payer: Self-pay | Admitting: Gynecology

## 2010-06-10 DIAGNOSIS — R1904 Left lower quadrant abdominal swelling, mass and lump: Secondary | ICD-10-CM

## 2010-06-11 NOTE — Cardiovascular Report (Signed)
Erin Hunt, Erin Hunt                ACCOUNT NO.:  [id number]   MEDICAL RECORD NO.:  [id number]          PATIENT TYPE:  OUT   LOCATION:  CATH                         FACILITY:  [facility]   PHYSICIAN:  Lyn Records III, M.D.DATE OF BIRTH:  [birth year]   DATE OF PROCEDURE:  05/05/2004  DATE OF DISCHARGE:  05/05/2004                              CARDIAC CATHETERIZATION   INDICATIONS FOR PROCEDURE:  The patient is 31 and has had two episodes of  chest discomfort over the past several days. Please refer to the admitting  history and physical. She has previously had a cardiac evaluation for  shortness of breath and this was abnormal with a fixed anterior wall defect.  This is possibly related to the patient's known left bundle branch block.  There is a strong family history of coronary disease. The patient has  hypertension and hyperlipidemia, though not treated. Study is being done to  define coronary anatomy.   PROCEDURE PERFORMED:  1.  Left heart catheterization.  2.  Selective coronary angiography.  3.  The left ventriculography.  4.  AngioSeal arteriotomy closure.   DESCRIPTION:  After informed consent, a 6-French sheath was started on the  right femoral artery using modified Seldinger technique. A 6-French A2  multipurpose catheter was then used for hemodynamic recordings, left  ventriculography by hand injection, and selective right coronary  angiography. We used a #4 6-French left and right Judkins catheter for  selective engagement of the left and right coronary respectively. Then 200  mcg of intracoronary nitroglycerin was administered into the left coronary.  This significantly reduced the patient's blood pressure which was quite  hypertensive during the procedure. After angiographic definition of the  coronaries, the case was terminated AngioSeal arteriotomy closure was  performed. The following sheath aided visualization of the iliac/femoral. No  complications occurred.   RESULTS:   HEMODYNAMIC DATA:  1.  Aortic pressure was 75/71.  2.  Left ventricular pressure 181/5 mmHg.   LEFT VENTRICULOGRAPHY:  Left ventricle is normal in size and demonstrates  normal contractility. EF is 60%.   CORONARY ANGIOGRAPHY:  1.  Left main coronary: Normal.  2.  The left anterior descending coronary: The LAD is large. There is      proximal calcification. The vessel contains irregularities in the      proximal and mid vessel up to 30-40%. There is a 50% to no more than 70%      stenosis in the midvessel within a region of tortuosity. LAD is quite      tortuous throughout its course.  3.  The first diagonal also contains irregularities with up to 60% narrowing      in its proximal segment. It is relatively small. No significant high-      grade obstruction is noted throughout the right coronary and the mid      region of stenosis is moderate.  4.  The subsets the circumflex artery: Circumflex coronary artery is large      vessel that gives origin to a trifurcating dominant obtuse marginal. No      significant obstruction  is noted within the circumflex system.  5.  Right coronary: The right coronary artery is a large vessel that gives      origin to a small PDA and left ventricular branch. Irregularities are      noted on the proximal and mid vessel. No significant obstruction is      seen.   CONCLUSION:  1.  The patient has moderate coronary artery disease with the most severely      involved also being the left anterior descending artery with 30-40%      narrowing proximally and 50% to no more than 70% narrowing in the mid      vessel within the very tortuous segment. Circumflex and right coronary      contains luminal irregularities but no significant obstruction.  2.  Normal left ventricular function.  3.  Significant blood pressure elevation.   PLAN:  Antihypertensive therapy, antilipid therapy, aspirin therapy, close  clinical follow-up. If recurring episodes of  chest pain, consider an  Adenosine-Cardiolite study. If apical ischemia is documented, mid-LAD stent  should be placed.      HWS/MEDQ  D:  05/05/2004  T:  05/05/2004  Job:  [id number]   cc:   Evelene Croon  [address]  [place]  [place] [address]  Fax: [phone]   Patient's chart   [facility]

## 2010-06-11 NOTE — H&P (Signed)
NAMEKRISTINIA, Erin Hunt                            ACCOUNT NO.:  [id number]   MEDICAL RECORD NO.:  [id number]                   PATIENT TYPE:  EMS   LOCATION:  MINO                                 FACILITY:  [facility]   PHYSICIAN:  Marlan Palau, M.D.               DATE OF BIRTH:  [birth year]   DATE OF ADMISSION:  01/28/2002  DATE OF DISCHARGE:                                HISTORY & PHYSICAL   HISTORY OF PRESENT ILLNESS:  The patient is a 77 year old right-handed white  female -- born 1928-08-30 -- with a history of irritable bowel  syndrome.  The patient comes in for an evaluation to the emergency room  tonight for problems with sudden onset of left facial numbness yesterday  associated with a skim over the left eye.  The patient noted onset around  4:30 p.m. today of some tingly sensations involving the left hand, working  up to the shoulder area in about 10 minutes.  This sensation has persisted,  unassociated with weakness or clumsiness and unassociated with speech  changes.  The patient reports several weeks of swallowing problems, pressure  in the lower chest/upper abdomen.  The patient has noted she has to drink a  lot of water in order to swallow solid food.  The patient denies any gait  disturbance, has noted some left frontotemporal headache tonight, otherwise,  has noted no new symptomatology.  The patient is admitted for evaluation of  possible stroke event.   PAST MEDICAL HISTORY:  1. New onset of left face/left arm numbness.  2. Swallowing/dysphagia problems, rule out esophageal stricture.  3. History of gallbladder resection.  4. Status post hysterectomy.  5. Ovarian resection.  6. Hypercholesterolemia.   MEDICATIONS:  1. Ogen 1.25 mg a day.  2. Tropan XL 10 mg daily.  3. Celebrex 200 mg one twice a day as needed.  4. Nexium 40 mg a day.  5. NuLev 0.125 mg one three times a day.   ALLERGIES:  The patient has an allergy to SULFA DRUGS, ASPIRIN intolerance.   HABITS:  Does not smoke or drink.   SOCIAL HISTORY:  [place],  is a widow, has four children who are alive and well.  The patient is  retired.   FAMILY MEDICAL HISTORY:  Family medical history is notable that mother died  with irritable bowel syndrome and diverticulitis; father died following a  motor vehicle accident and history of heart disease.  The patient has four  brothers, all with heart disease, and one sister with cerebral palsy.   REVIEW OF SYSTEMS:  Review of systems is notable for no recent fevers, has  had occasional chills, and does note some left frontotemporal headache,  slight neck pain at times, shortness of breath as noted, occasional pressure  in the chest, occasional nausea, no vomiting, no problems with control of  the bowels, does note some urinary incontinence at times, left facial  numbness, left arm numbness as above.   PHYSICAL EXAMINATION:  VITALS:  Blood pressure is 181/105.  Heart rate 87.  Respiratory rate 22.  Temperature:  Afebrile.  GENERAL:  In general, this patient is a fairly well-developed white female  who is alert and cooperative at the time of examination.  HEENT:  Head is atraumatic.  Eyes:  Pupils are equal, round and reactive to  light.  Disks are flat bilaterally.  NECK:  Neck is supple.  No carotid bruits are noted.  RESPIRATORY:  Examination is clear.  CARDIOVASCULAR:  Examination reveals a regular rate and rhythm with no  obvious murmurs or rubs noted.  EXTREMITIES:  Extremities without significant edema.  ABDOMEN:  Abdomen soft and nontender.  Positive bowel sounds.  NEUROLOGIC:  Cranial nerves as above.  Facial symmetry is present.  The  patient has good sensation of the face to pinprick and soft touch  bilaterally.  The patient has good strength of facial muscles and muscles  with head-turning and shoulder shrugs bilaterally.  Speech is well-  enunciated and not aphasic.  Motor  testing reveals 5/5 strength in all  fours.  Good and symmetric motor tone was noted throughout.  Sensory testing  was intact to pinprick, soft touch and vibratory sensation throughout.  The  patient has good finger-to-nose-to-finger and toe-to-finger bilaterally.  Gait was not tested.  Deep tendon reflexes are symmetric and normal.  Toes  are neutral bilaterally.   LABORATORY AND ACCESSORY CLINICAL DATA:  EKG is normal with a normal sinus  rhythm, heart rate of 76.   CT of the head was normal without contrast.   Blood work reveals creatinine 1.0, sodium 135, potassium 4.4, chloride 104,  BUN of 23, glucose of 96; pH of 7.425; white count of 6.7, hemoglobin 13.4,  hematocrit 40, MCV 84.1, platelets of 267,000; CK of 45, MB fraction 1.2,  troponin I less than 0.01.   IMPRESSION:  1. New onset of left face/left arm numbness, etiology unclear, rule out     stroke.  2. Dysphagia, rule out esophageal stricture.   We will admit this patient for a brief workup to rule out stroke event.  CT  of the head is unremarkable.  Exam is unremarkable.  The patient has  subjective symptomatology only.   PLAN:  1. Admission to [facility].  2. MRI of the brain.  3. MR angiogram.  4. Barium swallow.  5. Plavix therapy.  6. We will follow the patient's clinical course while in house.  7. We will also check a carotid Doppler study.                                               Marlan Palau, M.D.    CKW/MEDQ  D:  01/28/2002  T:  01/29/2002  Job:  [id number]

## 2010-06-11 NOTE — Procedures (Signed)
[place]. [facility]  Patient:    Erin Hunt, Erin Hunt                         MRN: [id number] Proc. Date: 08/16/00 Adm. Date:  08/16/00 Attending:  Florencia Reasons, M.D. CC:         Meindert A. Lacie Scotts, M.D.  Levander Campion, M.D.   Procedure Report  PROCEDURE:  Colonoscopy.  ENDOSCOPIST:  Florencia Reasons, M.D.  INDICATIONS:  A 77 year old female for screening for colon cancer.  FINDINGS:  Unremarkable exam to the cecum.  INFORMED CONSENT:  The nature, purpose, and risks of the procedure have been reviewed with the patient who provided written consent.  DESCRIPTION OF PROCEDURE:  Sedation for this procedure and the upper endoscopy which preceded it totalled Fentanyl 70 mcg and Versed 7 mg IV without arrhythmias or desaturations.  The Olympus adjustable tension pediatric video colonoscope was advanced to the cecum without significant difficulty and pullback was then performed.  We then have to use some external abdominal compression and take out loops in order to be able to advance the scope.  There were moderate internal hemorrhoids on retroflexed view and in the distal rectum but this was otherwise a normal exam and the quality of the prep was very good so it was felt that all areas were well seen.  No polyps, cancer, colitis, vascular malformations or diverticulosis were noted.  No biopsies were obtained.  The patient tolerated the procedure well and there were no apparent complications.  IMPRESSION:  Internal hemorrhoids, otherwise normal screening exam.  PLAN:  Consider flexible sigmoidoscopy in 5 years and possible follow up colonoscopy for screening purposes in 10 years if the patient remains in good medical health in the interim. DD:  08/16/00 TD:  08/16/00 Job: [id number]

## 2010-06-11 NOTE — Procedures (Signed)
[name]. [facility]  Patient:    Erin Hunt, [name]                         MRN: [id number] Proc. Date: 08/16/00 Attending:  Florencia Reasons, M.D. CC:         Audree Bane, MD  DR. NEERAJ SACHDEVA   Procedure Report  PROCEDURE PERFORMED:  Upper endoscopy with biopsies.  ENDOSCOPIST:  Florencia Reasons, M.D.  INDICATIONS FOR PROCEDURE:  The patient is a very pleasant 77 year old female with reflux-type symptomatology, albeit somewhat atypical in character and refractory to PPI therapy.  FINDINGS:  Essentially normal exam except for bile reflux gastritis.  DESCRIPTION OF PROCEDURE:  The nature, purpose and risks of the procedure had been discussed with the patient, who provided written consent.  Sedation was fentanyl 50 mcg and Versed 5 mg IV without arrhythmias or desaturation. The Olympus adult video endoscope was passed under direct vision.  The larynx looked essentially normal.  There was perhaps some minimal thickening of the posterior commissure and a tiny bit of erythema in the posterior portion of the left vocal cord but basically I did not see any obvious evidence of reflux laryngitis.   The esophagus was entered and the mucosa was noted to be normal.  There was no free gastroesophageal reflux.  There was no reflux esophagitis or Barretts esophagus and in fact, the squamocolumnar junction appeared to be located right about at the level of the diaphragm so that it did not appear to be any significant hiatal hernia, or any evidence of a ring or stricture, nor varices, infection or neoplasia.  The stomach was entered.  It contained a moderate bilious residual and the gastric mucosa had a somewhat "snakeskin" appearance with erythema rather diffusely.  Therefore, random biopsies were obtained of the stomach.  The bile was suctioned up.  Retroflex viewing was unremarkable.  No erosions, ulcers, polyps or masses were observed.  The  pylorus, duodenal bulb and second duodenum looked normal.  There was perhaps a little bit of edema of the folds in the third portion of the duodenum.  Biopsies were obtained from these folds as well as a biopsy or two from the more proximal section of the duodenum and I also obtained distal esophageal biopsies prior to removal of the scope.  The patient tolerated the procedure well and there were no apparent complications.  IMPRESSION:  Bile reflux with apparent bile reflux gastritis, otherwise unremarkable exam.  PLAN: 1. Await pathology on biopsies. 2. A two-week trial of Carafate two to three times a day on empty stomach    to see if that helps with the possible bile reflux gastritis. 3. Proceed to colonoscopic evaluation.DD:  08/16/00 TD:  08/16/00 Job: [id number]

## 2010-06-11 NOTE — Discharge Summary (Signed)
Erin Hunt, MUNFORD NO.:  [id number]   MEDICAL RECORD NO.:  [id number]          PATIENT TYPE:  OUT   LOCATION:  CATH                         FACILITY:  [facility]   PHYSICIAN:  Lyn Records, M.D.   DATE OF BIRTH:  12/31/1928   DATE OF ADMISSION:  05/05/2004  DATE OF DISCHARGE:  05/05/2004                                 DISCHARGE SUMMARY   REASON FOR ADMISSION:  Ms. Ivancic is a 77 year old white female patient with  known history of hypertension, dyslipidemia, GERD, left bundle branch block,  and fibromyalgia who began noticing bilateral tingling about one week ago.  Carotid Dopplers done per primary care were negative.  CT of the head showed  a benign tumor.  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  She began having aching and burning in her mid  sternum with associated shortness of breath about two days prior to  admission.  The discomfort would last about 30 minutes.  One day prior to  admission, this discomfort recurred again just before the patient went to  bed.  She informed a family member of this abnormal physical finding and  symptoms and subsequently presented to the hospital for further evaluation.  In January 2005, she had a 2D echocardiogram and nuclear study done that  showed mild septal hypokinesia, moderate MR and TR, the nuclear study showed  a fixed anterior apical defect, EF 51%.  The patient is also noticing a  moderate increase in fatigue symptoms lately.   On initial exam, the patient's blood pressure was somewhat elevated at  178/65, heart rate 80 and regular, respiratory rate 20, afebrile.  EKG  showed sinus rhythm with a left bundle branch block, upright T waves in the  lateral leads.  Lab work was pending at the time of admission. Physical exam  was, otherwise, unremarkable except for a grade 2/6 systolic murmur at the  left upper sternal border and a 1/6 systolic murmur at the apex.   Dr. Katrinka Blazing  evaluated this patient in conjunction with Adrian Saran, NP, and  she was admitted with the following diagnoses:  1.  Chest pain, rule out ischemic etiology.  2.  Dyslipidemia.  3.  Hypertension.  4.  Heart murmur.  5.  Gastroesophageal reflux disease.   HOSPITAL COURSE:  Chest discomfort, rule out ischemia.  The patient was  admitted to the telemetry unit.  She had initially been evaluated at [facility]  but due to lack of available telemetry beds, the patient was transferred to  [facility].  Routine cardiac enzymes were checked and these were within  normal limits.  EKG was within normal limits for patient.  She did have  underlying left bundle branch block.  Because of her history of heart  murmur, a 2D echocardiogram was performed on May 05, 2004, this is yet to  be read by the physician.  Since the patient had a previous abnormal  Cardiolite study and has had recurrent symptoms, Dr. Katrinka Blazing has opted to  proceed with diagnostic coronary angiography.  She underwent this  procedure  on May 05, 2004, in the [facility] cath lab after being transferred  over via [facility] to this facility.  LV gram was normal with an EF of 60%.  LAD showed a proximal 30% lesion, a mid eccentric 50-70% area within a  tortuous segment, otherwise, essentially normal exam on the other vessels.  There was also a small abnormality found within the RCA.  Dr. Michaelle Copas  recommendations were that the patient would be able to be discharged home  post cath once she was ambulating and there were no problems with her groin.  She had previously been on statin therapy and a fasting lipid panel had been  drawn during the hospitalization and was pending at the time of dictation.  At one point, Dr. Katrinka Blazing thought he may start her on Toprol but since her  coronary disease is not obstructive and her chest pain could have been  coronary invasive spasm related, he opted to continue her on her Norvasc  and, at this time, is  placing her on Norvasc plus Lipitor.  He is also  starting her on aspirin 81 mg daily, continuing her other medications as  previous, and has also given her sublingual nitroglycerin to take for any  chest discomfort.  I have also spent time talking with the patient and her  daughters in regards to proper administration of nitroglycerin pills  including taking the medications while seated or lying, and if chest pain  persists after three tablets, she is to notify the physician or EMS.  She  will be following up with Dr. Katrinka Blazing at least one time in the office.  At  this point, Dr. Katrinka Blazing feels that the patient can be otherwise followed by  her primary care physician unless her chest pain symptoms progress.   FINAL DIAGNOSIS:  1.  Chest pain with nonobstructive coronary artery disease, borderline in      the left anterior descending .  2.  Hypertension, moderate control.  3.  Dyslipidemia.  4.  Gastroesophageal reflux disease.  5.  Long standing left bundle branch block.  6.  Fibromyalgia.  7.  Known mitral regurgitation and tricuspid regurgitation, repeat      echocardiogram results pending at the time of dictation.   DISCHARGE MEDICATIONS:  1.  Enteric coated aspirin 81 mg daily.  2.  Nitroglycerin 0.4 mg as needed.  3.  Levsin as needed.  4.  Zyrtec as needed.  5.  Celebrex 200 mg every other day.   DISCHARGE INSTRUCTIONS:  Activities:  No lifting more than 10 pounds for the  next two days.  Diet:  Heart Healthy.  Wound care:  Shower only in the next  two days.  Follow up:  She is to see Dr. Katrinka Blazing on Monday, May 1, at 2:30  p.m.  She is to call Dr. Glenis Smoker to schedule additional follow up  appointment with possible new dose of lipid/statin medication, Dr. Glenis Smoker  may wish to pursue checking a statin panel in the next 6-8 weeks, as well.      ALE/MEDQ  D:  05/05/2004  T:  05/05/2004  Job:  [id number]

## 2010-06-14 ENCOUNTER — Ambulatory Visit (HOSPITAL_COMMUNITY): Payer: Medicare Other

## 2010-06-14 ENCOUNTER — Ambulatory Visit (HOSPITAL_COMMUNITY)
Admission: RE | Admit: 2010-06-14 | Discharge: 2010-06-14 | Disposition: A | Discharge status: Home / Self Care | Payer: Medicare Other | Source: Ambulatory Visit | Attending: Gynecology | Admitting: Gynecology

## 2010-06-14 DIAGNOSIS — R1032 Left lower quadrant pain: Secondary | ICD-10-CM | POA: Insufficient documentation

## 2010-06-14 DIAGNOSIS — Z90710 Acquired absence of both cervix and uterus: Secondary | ICD-10-CM | POA: Insufficient documentation

## 2010-06-14 DIAGNOSIS — R1904 Left lower quadrant abdominal swelling, mass and lump: Secondary | ICD-10-CM

## 2010-07-27 ENCOUNTER — Telehealth: Payer: Self-pay | Admitting: *Deleted

## 2010-07-27 DIAGNOSIS — R911 Solitary pulmonary nodule: Secondary | ICD-10-CM

## 2010-07-27 NOTE — Telephone Encounter (Signed)
Message copied by Salli Quarry on Tue Jul 27, 2010  5:30 PM ------      Message from: Salli Quarry      Created: Mon Apr 26, 2010  9:26 AM       F/u ct chest for nodules

## 2010-08-05 ENCOUNTER — Ambulatory Visit (INDEPENDENT_AMBULATORY_CARE_PROVIDER_SITE_OTHER)
Admission: RE | Admit: 2010-08-05 | Discharge: 2010-08-05 | Disposition: A | Discharge status: Home / Self Care | Payer: Medicare Other | Source: Ambulatory Visit | Attending: Pulmonary Disease | Admitting: Pulmonary Disease

## 2010-08-05 DIAGNOSIS — J984 Other disorders of lung: Secondary | ICD-10-CM

## 2010-08-05 DIAGNOSIS — R911 Solitary pulmonary nodule: Secondary | ICD-10-CM

## 2010-08-10 ENCOUNTER — Telehealth: Payer: Self-pay | Admitting: Pulmonary Disease

## 2010-08-10 NOTE — Telephone Encounter (Signed)
Called and spoke with pt.  Pt aware of Ct results.

## 2010-08-27 ENCOUNTER — Encounter: Payer: Self-pay | Admitting: Pulmonary Disease

## 2010-08-30 ENCOUNTER — Encounter: Payer: Self-pay | Admitting: Pulmonary Disease

## 2010-08-30 ENCOUNTER — Ambulatory Visit (INDEPENDENT_AMBULATORY_CARE_PROVIDER_SITE_OTHER): Payer: Medicare Other | Admitting: Pulmonary Disease

## 2010-08-30 DIAGNOSIS — R05 Cough: Secondary | ICD-10-CM

## 2010-08-30 DIAGNOSIS — R059 Cough, unspecified: Secondary | ICD-10-CM

## 2010-08-30 DIAGNOSIS — R918 Other nonspecific abnormal finding of lung field: Secondary | ICD-10-CM

## 2010-08-30 MED ORDER — AZITHROMYCIN 250 MG PO TABS: ORAL_TABLET | ORAL | Status: AC

## 2010-08-30 MED ORDER — BENZONATATE 100 MG PO CAPS: 100.0000 mg | ORAL_CAPSULE | Freq: Four times a day (QID) | ORAL | Status: DC | PRN

## 2010-08-30 NOTE — Patient Instructions (Signed)
Will treat with a zpak in the event your are developing a sinus or chest infection Start using hard candy during the day to avoid throat clearing NO throat clearing.  Drink water or swallow. Continue treatment of your postnasal drip Will use tessalon pearls during day as needed to keep cough from escalating.  Please be sure and call us in January 2013 to schedule your followup ct scan. If you are not improving, let us know.

## 2010-08-30 NOTE — Progress Notes (Signed)
  Subjective:    Patient ID: Erin Hunt, female    DOB: 1928/12/15, 77 y.o.   MRN: [id number]  HPI The patient comes in today for an acute sick visit related to a persistent cough.  She has known bronchiectasis that is very mild, as well as a chronic cough over a four-year duration that clearly has an upper airway component.  She is very susceptible whenever she gets sick to developing a cyclical cough.  She currently is having an increase in her cough with mild discolored mucus.  She denies any significant sinus pressure or chest congestion at this time.  She is not having worsening shortness of breath.  She has no significant fevers, chills, or sweats.   Review of Systems  Constitutional: Negative for fever and unexpected weight change.  HENT: Positive for sore throat. Negative for ear pain, nosebleeds, congestion, rhinorrhea, sneezing, trouble swallowing, dental problem, postnasal drip and sinus pressure.   Eyes: Negative for redness and itching.  Respiratory: Positive for cough. Negative for chest tightness, shortness of breath and wheezing.   Cardiovascular: Negative for palpitations and leg swelling.  Gastrointestinal: Negative for nausea and vomiting.  Genitourinary: Negative for dysuria.  Musculoskeletal: Negative for joint swelling.  Skin: Negative for rash.  Neurological: Negative for headaches.  Hematological: Does not bruise/bleed easily.  Psychiatric/Behavioral: Negative for dysphoric mood. The patient is not nervous/anxious.        Objective:   Physical Exam Thin female in nad Nares without discharge, no purulence OP clear Chest totally clear to auscultation Cor with rrr, 2/6 sem LE without edema, no cyanosis Alert and oriented, moves all 4        Assessment & Plan:

## 2010-09-04 NOTE — Assessment & Plan Note (Signed)
The patient has done fairly well from a cough standpoint since her last visit, but is now having increasing symptoms with some discolored mucus.  She does have a history of bronchiectasis, and is prone to recurrent respiratory infections.  She also has a history of a hyper sensitized upper airway which often leads to cyclical coughing.  I will go ahead and treat her with a short course of antibiotics, but have also asked her to start her behavioral therapies for cyclical coughing.  She is to call us if her symptoms do not improve or if they worsen.

## 2010-09-04 NOTE — Assessment & Plan Note (Signed)
Reminded pt to call us in Jan of 2013 to schedule her f/u ct chest.

## 2010-12-22 ENCOUNTER — Telehealth: Payer: Self-pay | Admitting: Pulmonary Disease

## 2010-12-22 NOTE — Telephone Encounter (Signed)
Pt returned call Krista L Lilly  °

## 2010-12-22 NOTE — Telephone Encounter (Signed)
Last ov note, KC rec'd to repeat the CT in 01/2011.  Called spoke with patient who stated that she would like her CT "moved up" because she has been "coughing with every breath" > dry cough x3 weeks.  Has been given a zpak and dulera 100-70mcg, and has been using nettipot bid.  Pt denies any increased SOB and wheezing.  KC with openings tomorrow - appt scheduled with [facility] 12-23-10 @ 1015.

## 2010-12-22 NOTE — Telephone Encounter (Signed)
[email or web address]  

## 2010-12-22 NOTE — Telephone Encounter (Signed)
PATIENT RETURNED CALL PLEASE CALL BACK

## 2010-12-23 ENCOUNTER — Encounter: Payer: Self-pay | Admitting: Adult Health

## 2010-12-23 ENCOUNTER — Ambulatory Visit (INDEPENDENT_AMBULATORY_CARE_PROVIDER_SITE_OTHER)
Admission: RE | Admit: 2010-12-23 | Discharge: 2010-12-23 | Disposition: A | Discharge status: Home / Self Care | Payer: Medicare Other | Source: Ambulatory Visit | Attending: Adult Health | Admitting: Adult Health

## 2010-12-23 ENCOUNTER — Ambulatory Visit (INDEPENDENT_AMBULATORY_CARE_PROVIDER_SITE_OTHER): Payer: Medicare Other | Admitting: Adult Health

## 2010-12-23 VITALS — BP 128/62 | HR 82 | Temp 96.9°F | Ht 63.0 in | Wt 149.0 lb

## 2010-12-23 DIAGNOSIS — R05 Cough: Secondary | ICD-10-CM

## 2010-12-23 DIAGNOSIS — R918 Other nonspecific abnormal finding of lung field: Secondary | ICD-10-CM

## 2010-12-23 MED ORDER — PREDNISONE 10 MG PO TABS: ORAL_TABLET | ORAL | Status: AC

## 2010-12-23 NOTE — Patient Instructions (Signed)
Prednisone taper over next week.  Stop [facility] using hard candy during the day to avoid throat clearing NO throat clearing.  Drink water or swallow. Chlorphenaramine 4mg  .every 4 hr as needed for drainage/throat clearing  Zyrtec 10mg  At bedtime   Add Delysm 2 tsp Twice daily  Cough syrup  Use tessalon pearls .Three times a day   Hold Fish oil and vitamin d for now (oil based meds )  We will move up CT scan -will discuss results on return  follow up Dr. Shelle Iron in 2-3 weeks and As needed   Please contact office for sooner follow up if symptoms do not improve or worsen or seek emergency care

## 2010-12-23 NOTE — Assessment & Plan Note (Signed)
Move CT chest up in next couple of weeks  follow up CT on return ov

## 2010-12-23 NOTE — Progress Notes (Signed)
  Subjective:    Patient ID: Erin Hunt, female    DOB: Jul 19, 1928, 77 y.o.   MRN: [id number]  HPI 77 yo female with known hx of Chronic cough and Bronchiectasis   12/23/2010 Acute OV  Complains of productive  cough with occasional thick white mucus, hoarseness, x3weeks. Seen by PCP 2 weeks ago , given , Zpack and Dulera . Worse in am. Strong odors cause cough to be worse. Talking, cold air and laughing cause worsening cough. Does a lot of throat clearing. Has some post nasal drip with Biscardi in throat . After zpack some better but did not go entirely away and now cough is back . No help with Dulera. Has upcoming CT chest next month for follow up of lung nodules    Review of Systems Constitutional:   No  weight loss, night sweats,  Fevers, chills,  +fatigue, or  lassitude.  HEENT:   No headaches,  Difficulty swallowing,  Tooth/dental problems, or  Sore throat,                No sneezing, itching, ear ache,  ++nasal congestion, post nasal drip,   CV:  No chest pain,  Orthopnea, PND, swelling in lower extremities, anasarca, dizziness, palpitations, syncope.   GI  No heartburn, indigestion, abdominal pain, nausea, vomiting, diarrhea, change in bowel habits, loss of appetite, bloody stools.   Resp:   No coughing up of blood. No chest wall deformity  Skin: no rash or lesions.  GU: no dysuria, change in color of urine, no urgency or frequency.  No flank pain, no hematuria   MS:  No joint pain or swelling.  No decreased range of motion.  No back pain.  Psych:  No change in mood or affect. No depression or anxiety.  No memory loss.         Objective:   Physical Exam GEN: A/Ox3; pleasant , NAD, elderly   HEENT:  [place]/AT,  EACs-clear, TMs-wnl, NOSE-clear, THROAT-clear, no lesions, no postnasal drip or exudate noted.   NECK:  Supple w/ fair ROM; no JVD; normal carotid impulses w/o bruits; no thyromegaly or nodules palpated; no lymphadenopathy.  RESP  Coarse BS with few rhonchi , w/o,  wheezes/ rales/ or rhonchi.no accessory muscle use, no dullness to percussion  CARD:  RRR, no m/r/g  , no peripheral edema, pulses intact, no cyanosis or clubbing.  GI:   Soft & nt; nml bowel sounds; no organomegaly or masses detected.  Musco: Warm bil, no deformities or joint swelling noted.   Neuro: alert, no focal deficits noted.    Skin: Warm, no lesions or rashes         Assessment & Plan:

## 2010-12-23 NOTE — Assessment & Plan Note (Signed)
Slow to resolve flare , no sign of active infection  Will tx with steroids along w/ GERD/AR prevention  Will try set up CT scan ealier in next couple of weeks   Plan:  Prednisone taper over next week.  Stop [facility] using hard candy during the day to avoid throat clearing NO throat clearing.  Drink water or swallow. Chlorphenaramine 4mg  .every 4 hr as needed for drainage/throat clearing  Zyrtec 10mg  At bedtime   Add Delysm 2 tsp Twice daily  Cough syrup  Use tessalon pearls .Three times a day   Hold Fish oil and vitamin d for now (oil based meds )  We will move up CT scan -will discuss results on return  follow up Dr. Shelle Iron in 2-3 weeks and As needed   Please contact office for sooner follow up if symptoms do not improve or worsen or seek emergency care

## 2010-12-28 NOTE — Progress Notes (Signed)
Visit reviewed, and agree with plan as outlined.

## 2010-12-29 ENCOUNTER — Other Ambulatory Visit: Payer: Self-pay | Admitting: *Deleted

## 2010-12-29 MED ORDER — BENZONATATE 100 MG PO CAPS: 100.0000 mg | ORAL_CAPSULE | Freq: Four times a day (QID) | ORAL | Status: DC | PRN

## 2011-01-06 ENCOUNTER — Ambulatory Visit: Payer: Medicare Other | Admitting: Pulmonary Disease

## 2011-01-28 ENCOUNTER — Encounter: Payer: Self-pay | Admitting: Pulmonary Disease

## 2011-01-28 ENCOUNTER — Ambulatory Visit (INDEPENDENT_AMBULATORY_CARE_PROVIDER_SITE_OTHER): Payer: Medicare Other | Admitting: Pulmonary Disease

## 2011-01-28 DIAGNOSIS — R918 Other nonspecific abnormal finding of lung field: Secondary | ICD-10-CM

## 2011-01-28 DIAGNOSIS — R05 Cough: Secondary | ICD-10-CM

## 2011-01-28 NOTE — Patient Instructions (Signed)
Continue with zyrtec during day, but use chlorpheniramine 8mg  at bedtime rather than benedryl if you are having postnasal drip issues Continue with acid reflux medications.  You made need to see a GI specialist, and will leave that to your primary doctor Continue with no throat clearing, using hard candy, limiting voice use until you are completely better. Will need ct chest the end of this year as the final followup for your "spots".  Please call us in October to schedule.

## 2011-01-28 NOTE — Assessment & Plan Note (Signed)
Pt will need final f/u ct chest in Nov this year.  She knows to call us in October to schedule.

## 2011-01-28 NOTE — Progress Notes (Signed)
  Subjective:    Patient ID: Erin Hunt, female    DOB: [birth year], 78 y.o.   MRN: [id number]  HPI The patient comes in today for followup of her known cyclical cough.  This is felt to be secondary to postnasal drip, possible laryngopharyngeal reflux, and also a cyclical cough mechanism.  She had a recent acute exacerbation, and was seen by our nurse practitioner.  She was treated with a course of prednisone and intensification of her behavioral therapies.  She comes in today where she is at least 80% improved, but continues to have a classic globus sensation.  She is having ongoing postnasal drip but is somewhat better.  She is staying on her reflux medication, and denies classic GERD.  However she does have swallowing issues at times.  The patient also has a history of nonspecific pulmonary nodules on chest CT, and her followup in November of 2012 showed improvement.  She will need one more followup in November of 2013.   Review of Systems  Constitutional: Negative for fever and unexpected weight change.  HENT: Positive for congestion, rhinorrhea, trouble swallowing and postnasal drip. Negative for ear pain, nosebleeds, sore throat, sneezing, dental problem and sinus pressure.   Eyes: Positive for redness and itching.  Respiratory: Positive for cough. Negative for chest tightness, shortness of breath and wheezing.   Cardiovascular: Negative for palpitations and leg swelling.  Gastrointestinal: Negative for nausea and vomiting.  Genitourinary: Negative for dysuria.  Musculoskeletal: Negative for joint swelling.  Skin: Negative for rash.  Neurological: Negative for headaches.  Hematological: Does not bruise/bleed easily.  Psychiatric/Behavioral: Negative for dysphoric mood. The patient is not nervous/anxious.        Objective:   Physical Exam Well-developed female in no acute distress Nose without purulence or discharge noted Oropharynx clear Chest totally clear the patient, no wheezes  or rhonchi Cardiac exam was regular rate and rhythm Lower extremities without edema, no cyanosis noted Alert and oriented, moves all 4 extremities.       Assessment & Plan:

## 2011-01-28 NOTE — Assessment & Plan Note (Signed)
The patient has had a recent flare up of her cyclical cough, but has responded to a short course of prednisone and intensification of her behavioral therapies.  She states that her cough is at least 80% improved.  She continues to have postnasal drip at times, and I have asked her to try using chlorpheniramine during these episodes.  She is staying on her reflux medication, but has swallowing problems at times.  Perhaps she would benefit from a GI evaluation at some point, but I will leave that to her primary care physician.  I continued to believe that her cough is nonpulmonary, and that she has a hyper- sensitized upper airway that leads to this.  The patient has had this issue for over 4 years, and we'll continue to to be a problem any time she has a URI or something that triggers coughing.

## 2011-02-02 ENCOUNTER — Encounter: Payer: Self-pay | Admitting: Pulmonary Disease

## 2011-02-11 ENCOUNTER — Telehealth: Payer: Self-pay | Admitting: Oncology

## 2011-02-11 NOTE — Telephone Encounter (Signed)
pt called to r/s her 1/22 appt due to a cold and has r/s for 3/1   aom

## 2011-02-15 ENCOUNTER — Ambulatory Visit: Payer: Medicare Other | Admitting: Oncology

## 2011-02-15 ENCOUNTER — Other Ambulatory Visit: Payer: Medicare Other | Admitting: Lab

## 2011-03-02 ENCOUNTER — Encounter: Payer: Self-pay | Admitting: Pulmonary Disease

## 2011-03-07 ENCOUNTER — Ambulatory Visit: Payer: Self-pay | Admitting: Rheumatology

## 2011-03-25 ENCOUNTER — Telehealth: Payer: Self-pay | Admitting: Oncology

## 2011-03-25 ENCOUNTER — Ambulatory Visit (HOSPITAL_BASED_OUTPATIENT_CLINIC_OR_DEPARTMENT_OTHER): Payer: Medicare Other | Admitting: Oncology

## 2011-03-25 ENCOUNTER — Encounter: Payer: Self-pay | Admitting: Oncology

## 2011-03-25 ENCOUNTER — Other Ambulatory Visit: Payer: Medicare Other | Admitting: Lab

## 2011-03-25 VITALS — BP 136/71 | HR 104 | Temp 97.3°F | Ht 63.0 in | Wt 145.7 lb

## 2011-03-25 DIAGNOSIS — D472 Monoclonal gammopathy: Secondary | ICD-10-CM | POA: Insufficient documentation

## 2011-03-25 LAB — COMPREHENSIVE METABOLIC PANEL
AST: 19 U/L (ref 0–37)
Albumin: 4 g/dL (ref 3.5–5.2)
BUN: 22 mg/dL (ref 6–23)
Calcium: 9.5 mg/dL (ref 8.4–10.5)
Chloride: 96 mEq/L (ref 96–112)
Glucose, Bld: 105 mg/dL — ABNORMAL HIGH (ref 70–99)
Potassium: 3.8 mEq/L (ref 3.5–5.3)
Sodium: 133 mEq/L — ABNORMAL LOW (ref 135–145)
Total Protein: 7.6 g/dL (ref 6.0–8.3)

## 2011-03-25 LAB — CBC WITH DIFFERENTIAL/PLATELET
Basophils Absolute: 0 10*3/uL (ref 0.0–0.1)
HCT: 42.4 % (ref 34.8–46.6)
HGB: 14.4 g/dL (ref 11.6–15.9)
MONO#: 0.4 10*3/uL (ref 0.1–0.9)
NEUT#: 6.1 10*3/uL (ref 1.5–6.5)
NEUT%: 70.8 % (ref 38.4–76.8)
WBC: 8.6 10*3/uL (ref 3.9–10.3)
lymph#: 2 10*3/uL (ref 0.9–3.3)

## 2011-03-25 NOTE — Progress Notes (Signed)
This office note has been dictated.  #![phone]

## 2011-03-25 NOTE — Progress Notes (Signed)
CC:   Erin Hunt  PROBLEM LIST: 1. 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  Quantitative immunoglobulins have been normal.     Urine immunofixation electrophoresis was negative.  A 24-hour urine     protein was normal.  The beta-2 microglobulin was minimally     increased at initial evaluation.  The patient has not had a bone     marrow or metastatic bone survey.  Our clinical impression at this     time is that the patient's MGUS is most likely benign. 2. Fibromyalgia. 3. Hypertension. 4. GERD. 5. Dyslipidemia. 6. Diverticulosis of the colon. 7. Hearing impairment, requiring hearing aids. 8. Coronary artery disease. 9. Irritable bowel syndrome. 10.Cervical spine disease. 11.Systolic ejection murmur. 12.Pulmonary nodules noted on CT scan of the chest in November 2011. 13.Status post surgery on the right hand for carpal tunnel syndrome in     early 2012.  MEDICATIONS: 1. Norvasc 5 mg daily. 2. Lipitor 20 mg daily. 3. Tessalon Perles as needed. 4. Biotin 10 mg daily. 5. Calcium gluconate 500 mg daily. 6. Celebrex 200 mg daily. 7. Zyrtec 10 mg daily. 8. Chlorphen 4-8 mg at bedtime. 9. Vitamin D3 1,000 units daily. 10.Flexeril 10 mg at bedtime. 11.Diovan/hydrochlorothiazide 160/12.5 mg daily. 12.Benadryl 25 mg as needed. 13.Nexium 40 mg daily. 14.Ogen 1.5 mg daily. 15.Levsin 0.125 mg sublingually as needed. 16.Magonate 500 mg daily. 17.Bactroban 2% ointment twice a day as needed topically. 18.Lovaza 1 g daily. 19.Pataday 0.2% solution one drop in both eyes daily.  HISTORY:  I saw Erin Hunt today for followup of her IgG kappa monoclonal gammopathy first detected in January 2008 when the patient was undergoing evaluation by Dr. Corliss Skains for symptoms felt to be due to fibromyalgia and osteoarthritis.  The patient was last seen by [place] on 02/15/2010.  She lives on her own.  She is quite independent and drives. She lives in  [place].  The patient tells me that she has seen Dr. Marcelyn Bruins for evaluation of pulmonary nodules seen on CT scan.  She had surgery on her right wrist for carpal tunnel syndrome in either February or March of 2012.  Overall, her health is extremely good for someone now 78 years old.  She tells me that she is having some low back problems, which have been evaluated by a doctor in [place] with MRI and x-rays.  PHYSICAL EXAMINATION:  General Appearance:  She looks well and considerably younger than her stated age of 89.  Weight today is 145.7 pounds.  Height 5 feet and 3 inches.  Body surface area 1.71 m2.  Vital Signs:  Blood pressure 136/71.  Other vital signs are normal.  HEENT: There is no scleral icterus.  Mouth and pharynx are benign.  She has bilateral hearing aids.  No peripheral adenopathy palpable.  Lungs: Clear to percussion and auscultation.  Cardiac Exam:  Regular rhythm with systolic ejection murmur.  Breasts:  Not examined.  No axillary adenopathy.  Abdomen:  Benign with no organomegaly or masses palpable. Extremities:  No peripheral edema or clubbing.  Neurologic Exam: Grossly normal.  LABORATORY DATA TODAY:  White count 8.6, ANC 6.1, hemoglobin 14.4, hematocrit 42.4, platelets 327,000.  Chemistries were normal, except for sodium of 133 and a glucose of 105.  The patient was given a copy of her labs.  Quantitative immunoglobulins today are pending.  On 02/15/2010, the IgG level was 1,040, IgA 290, IgM 178; all of which are normal  and essentially unchanged from the prior values.  IMAGING STUDIES: 1. CT scan of the chest without IV contrast on 08/05/2010 showed     nodules at the left lung base measuring between 4 and 6 mm.  These     nodules appear similar, apparently, to a comparison CT scan of     11/26/2009.  There was a ground-glass opacity in the peripheral     aspect of the left upper lobe measuring 6.2 mm.  There were tiny     granulomata on the  posterior aspect of the left upper lobe and     inferior medial aspect of the right upper lobe.  There was no     significant change in the nodularity of the right middle lobe.     There was stable appearance of normal to top-normal sized     mediastinal lymph nodes.  There were coronary artery     calcifications.  There was calcification and ectasia of the     ascending thoracic aorta measuring up to 3.4 cm.  The patient has     had a prior cholecystectomy.  There were mild degenerative changes     in the thoracic spine without any destructive lesions. 2. CT scan of the chest without IV contrast from 12/23/2010 showed     interval decrease in the size of the left lower lobe nodules and     focus of ground-glass opacity at the left upper lobe.  The right     middle lobe nodularity was stable.  IMPRESSION AND PLAN:  Erin Hunt continues to do well with no significant changes in her quantitative immunoglobulins; specifically, the IgG level.  Our clinical impressions are that the patient's IgG kappa monoclonal gammopathy is benign.  We will see [name] again in 1 year at which time we will check CBC, chemistries, and quantitative immunoglobulins.    ______________________________ Samul Dada, M.D. DSM/MEDQ  D:  03/25/2011  T:  03/25/2011  Job:  [id number]

## 2011-03-25 NOTE — Telephone Encounter (Signed)
appt made and printerd for pt aom

## 2011-03-26 LAB — IGG, IGA, IGM: IgA: 292 mg/dL (ref 69–380)

## 2011-03-30 ENCOUNTER — Encounter: Payer: Self-pay | Admitting: Unknown Physician Specialty

## 2011-04-11 ENCOUNTER — Ambulatory Visit: Payer: Self-pay | Admitting: Ophthalmology

## 2011-04-11 LAB — SODIUM: Sodium: 135 mmol/L — ABNORMAL LOW (ref 136–145)

## 2011-04-11 LAB — POTASSIUM: Potassium: 4.4 mmol/L (ref 3.5–5.1)

## 2011-04-18 ENCOUNTER — Ambulatory Visit: Payer: Self-pay | Admitting: Ophthalmology

## 2011-04-25 ENCOUNTER — Encounter: Payer: Self-pay | Admitting: Unknown Physician Specialty

## 2011-05-25 ENCOUNTER — Encounter: Payer: Self-pay | Admitting: Unknown Physician Specialty

## 2011-05-25 ENCOUNTER — Ambulatory Visit: Payer: Self-pay | Admitting: Ophthalmology

## 2011-06-01 ENCOUNTER — Other Ambulatory Visit: Payer: Self-pay | Admitting: Family Medicine

## 2011-06-02 NOTE — Telephone Encounter (Signed)
Ok to refill 

## 2011-06-02 NOTE — Telephone Encounter (Signed)
Will refill electronically  

## 2011-12-07 ENCOUNTER — Ambulatory Visit: Payer: Self-pay | Admitting: Internal Medicine

## 2011-12-08 ENCOUNTER — Telehealth: Payer: Self-pay | Admitting: Pulmonary Disease

## 2011-12-08 DIAGNOSIS — R911 Solitary pulmonary nodule: Secondary | ICD-10-CM

## 2011-12-08 NOTE — Telephone Encounter (Signed)
Per last OV note from 01-28-11: Will need ct chest the end of this year as the final followup for your "spots". Please call us in October to schedule. Order placed for CT chest without contrast. Pt advsied. Carron Curie, CMA  Carron Curie, CMA

## 2011-12-09 ENCOUNTER — Inpatient Hospital Stay: Payer: Self-pay | Admitting: Registered Nurse

## 2011-12-10 LAB — CBC WITH DIFFERENTIAL/PLATELET
Basophil #: 0.1 10*3/uL (ref 0.0–0.1)
Basophil %: 1.4 %
Eosinophil #: 0.2 10*3/uL (ref 0.0–0.7)
Eosinophil %: 2.3 %
HCT: 35.9 % (ref 35.0–47.0)
Lymphocyte #: 2.6 10*3/uL (ref 1.0–3.6)
Lymphocyte %: 35.9 %
MCHC: 33.8 g/dL (ref 32.0–36.0)
MCV: 84 fL (ref 80–100)
Monocyte #: 0.7 x10 3/mm (ref 0.2–0.9)
Neutrophil #: 3.7 10*3/uL (ref 1.4–6.5)
Platelet: 286 10*3/uL (ref 150–440)
RBC: 4.27 10*6/uL (ref 3.80–5.20)
RDW: 14.4 % (ref 11.5–14.5)

## 2011-12-10 LAB — BASIC METABOLIC PANEL
Anion Gap: 3 — ABNORMAL LOW (ref 7–16)
BUN: 27 mg/dL — ABNORMAL HIGH (ref 7–18)
Calcium, Total: 8.2 mg/dL — ABNORMAL LOW (ref 8.5–10.1)
Chloride: 107 mmol/L (ref 98–107)
Co2: 29 mmol/L (ref 21–32)
Osmolality: 282 (ref 275–301)
Potassium: 4.5 mmol/L (ref 3.5–5.1)
Sodium: 139 mmol/L (ref 136–145)

## 2011-12-26 ENCOUNTER — Ambulatory Visit (INDEPENDENT_AMBULATORY_CARE_PROVIDER_SITE_OTHER)
Admission: RE | Admit: 2011-12-26 | Discharge: 2011-12-26 | Disposition: A | Discharge status: Home / Self Care | Payer: Medicare Other | Source: Ambulatory Visit | Attending: Pulmonary Disease | Admitting: Pulmonary Disease

## 2011-12-26 DIAGNOSIS — R911 Solitary pulmonary nodule: Secondary | ICD-10-CM

## 2011-12-27 ENCOUNTER — Telehealth: Payer: Self-pay | Admitting: Pulmonary Disease

## 2011-12-27 NOTE — Telephone Encounter (Signed)
Notes Recorded by Barbaraann Share, MD on 12/26/2011 at 5:09 PM Please let pt know that nodules are totally unchanged. No further followup needed. Great news!  LMOMTCB x 1.

## 2011-12-27 NOTE — Telephone Encounter (Signed)
Pt calling again in ref to previous msg.Erin Hunt ° ° °

## 2011-12-28 NOTE — Telephone Encounter (Signed)
Pt notified of results per Dr. Shelle Iron and a copy has been sent to Dr. Yves Dill per patient request.

## 2012-03-14 ENCOUNTER — Telehealth: Payer: Self-pay | Admitting: Oncology

## 2012-03-23 ENCOUNTER — Other Ambulatory Visit: Payer: Medicare Other | Admitting: Lab

## 2012-03-23 ENCOUNTER — Ambulatory Visit: Payer: Medicare Other | Admitting: Oncology

## 2012-04-02 ENCOUNTER — Other Ambulatory Visit: Payer: Self-pay | Admitting: Medical Oncology

## 2012-04-02 DIAGNOSIS — D472 Monoclonal gammopathy: Secondary | ICD-10-CM

## 2012-04-03 ENCOUNTER — Encounter: Payer: Self-pay | Admitting: Oncology

## 2012-04-03 ENCOUNTER — Ambulatory Visit (HOSPITAL_BASED_OUTPATIENT_CLINIC_OR_DEPARTMENT_OTHER): Payer: Medicare Other | Admitting: Oncology

## 2012-04-03 ENCOUNTER — Other Ambulatory Visit (HOSPITAL_BASED_OUTPATIENT_CLINIC_OR_DEPARTMENT_OTHER): Payer: Medicare Other | Admitting: Lab

## 2012-04-03 ENCOUNTER — Telehealth: Payer: Self-pay | Admitting: Oncology

## 2012-04-03 VITALS — BP 138/74 | HR 102 | Temp 96.9°F | Resp 18 | Ht 63.0 in | Wt 145.9 lb

## 2012-04-03 DIAGNOSIS — D472 Monoclonal gammopathy: Secondary | ICD-10-CM

## 2012-04-03 DIAGNOSIS — I10 Essential (primary) hypertension: Secondary | ICD-10-CM

## 2012-04-03 LAB — COMPREHENSIVE METABOLIC PANEL (CC13)
Albumin: 3.4 g/dL — ABNORMAL LOW (ref 3.5–5.0)
BUN: 23.7 mg/dL (ref 7.0–26.0)
CO2: 23 mEq/L (ref 22–29)
Calcium: 8.8 mg/dL (ref 8.4–10.4)
Chloride: 106 mEq/L (ref 98–107)
Glucose: 136 mg/dl — ABNORMAL HIGH (ref 70–99)
Potassium: 4.4 mEq/L (ref 3.5–5.1)

## 2012-04-03 LAB — CBC WITH DIFFERENTIAL/PLATELET
BASO%: 1 % (ref 0.0–2.0)
Basophils Absolute: 0.1 10*3/uL (ref 0.0–0.1)
HCT: 38.8 % (ref 34.8–46.6)
HGB: 12.7 g/dL (ref 11.6–15.9)
LYMPH%: 26.6 % (ref 14.0–49.7)
MCH: 27.4 pg (ref 25.1–34.0)
MCHC: 32.7 g/dL (ref 31.5–36.0)
MONO#: 0.4 10*3/uL (ref 0.1–0.9)
NEUT%: 65 % (ref 38.4–76.8)
Platelets: 252 10*3/uL (ref 145–400)
WBC: 7.5 10*3/uL (ref 3.9–10.3)

## 2012-04-03 LAB — LACTATE DEHYDROGENASE (CC13): LDH: 107 U/L — ABNORMAL LOW (ref 125–245)

## 2012-04-03 NOTE — Progress Notes (Signed)
This office note has been dictated.  #[id number]

## 2012-04-03 NOTE — Telephone Encounter (Signed)
Gave pt appt for March 2015 lab MD

## 2012-04-04 NOTE — Progress Notes (Signed)
CC:   Neelam Khan  PROBLEM LIST:  1. 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 Quantitative immunoglobulins have been normal.  Urine immunofixation electrophoresis was negative. A 24-hour urine  protein was normal. The beta-2 microglobulin was minimally  increased at initial evaluation. The patient has not had a bone  marrow or metastatic bone survey. Our clinical impression at this  time is that the patient's MGUS is most likely benign.  2. Fibromyalgia.  3. Hypertension.  4. GERD.  5. Dyslipidemia.  6. Diverticulosis of the colon.  7. Hearing impairment, requiring hearing aids.  8. Coronary artery disease.  9. Irritable bowel syndrome.  10.Cervical spine disease.  11.Systolic ejection murmur.  12. Pulmonary nodules noted on CT scan of the chest on 11/26/2009.     Repeat CT scan of the chest carried out on 12/26/2011 shows     stability of the bilateral pulmonary nodules felt to be most likely     benign and unchanged from prior studies.  13.Status post surgery on the right hand for carpal tunnel syndrome in  early 2012.   MEDICATIONS:  Reviewed and recorded. Current Outpatient Prescriptions  Medication Sig Dispense Refill  . amLODipine (NORVASC) 5 MG tablet Take 1 tablet by mouth daily.      [name] atorvastatin (LIPITOR) 20 MG tablet Take 20 mg by mouth daily.        . Biotin 10 MG TABS Take 1 tablet by mouth daily.       . CELEBREX 200 MG capsule Take 1 tablet by mouth daily.      . Cholecalciferol (VITAMIN D3) 1000 UNITS CAPS Take 1 tablet by mouth daily.       . cyclobenzaprine (FLEXERIL) 10 MG tablet Take 10 mg by mouth at bedtime.        . diphenhydrAMINE (BENADRYL) 25 MG tablet Take 25 mg by mouth every 6 (six) hours as needed.        [name] esomeprazole (NEXIUM) 40 MG capsule Take 40 mg by mouth daily.       [name] estropipate (OGEN) 1.5 MG tablet Take 1.5 mg by mouth daily.        . fexofenadine (ALLEGRA) 180 MG tablet Take 180 mg by mouth  daily.      . hyoscyamine (LEVSIN SL) 0.125 MG SL tablet 2-4 tabs daily as needed       . LOVAZA 1 G capsule TAKE 1 CAPSULE 3 TIMES A   DAY  270 capsule  3  . magnesium gluconate (MAGONATE) 500 MG tablet Take 500 mg by mouth daily.       . valsartan (DIOVAN) 160 MG tablet Take 160 mg by mouth daily.      . vitamin E 100 UNIT capsule Take 300 Units by mouth daily.       No current facility-administered medications for this visit.    SMOKING HISTORY:  The patient has never smoked cigarettes.    HISTORY:  Erin Hunt was seen today for followup of her IgG kappa monoclonal gammopathy first detected in January 2008 when the patient was undergoing evaluation by Dr. Corliss Skains.  Worsened symptoms felt to be due to fibromyalgia and osteoarthritis. The patient was last seen by [place] on 03/25/2011.  She continues to do extremely well living on her own, driving, quite independent.  She exercises and walks regularly.  The patient tells me that she required overnight stay in the hospital at [facility] for a low sodium. Her diuretic  was discontinued and she received IV fluids.  She has undergone cataract surgery bilaterally within the last year.  She denies any musculoskeletal pains.  She had been having some back discomfort. She thinks she may be slowing down a little bit, but she remains quite active especially for her age of 10.  She is due for mammogram tomorrow.  PHYSICAL EXAMINATION:  General:  The patient is a youthful-appearing 79- year-old, quite active and energetic for her age.  Vital Signs:  Weight is 145 pounds 14.4 ounces, height 5 feet 3 inches, body surface area 1.72 sq m.  Blood pressure 138/74.  Other vital signs are normal.  HEENT: There is no scleral icterus. Mouth and pharynx are benign. She has  bilateral hearing aids. No peripheral adenopathy palpable. Lungs:  Clear to percussion and auscultation. Cardiac Exam: Regular rhythm  with systolic ejection murmur. Breasts:  Not examined. No axillary  adenopathy. Abdomen: Benign with no organomegaly or masses palpable.  Extremities: No peripheral edema or clubbing. Neurologic Exam:  Grossly normal.   LABORATORY DATA:  Today, white count 7.5, ANC 4.9, hemoglobin 12.7, hematocrit 38.8, platelets 252,000.  Chemistries today were notable for an albumin of 3.4 and a glucose of 136.  Quantitative immunoglobulins are pending.  On 03/25/2011 quantitative immunoglobulins were normal. IgG level was 11/20 as compared with 1040 on 02/15/2010 and 1030 on 06/08/2009.  IMAGING STUDIES:  1. CT scan of the chest without IV contrast on 08/05/2010 showed  nodules at the left lung base measuring between 4 and 6 mm. These  nodules appear similar, apparently, to a comparison CT scan of  11/26/2009. There was a ground-glass opacity in the peripheral  aspect of the left upper lobe measuring 6.2 mm. There were tiny  granulomata on the posterior aspect of the left upper lobe and  inferior medial aspect of the right upper lobe. There was no  significant change in the nodularity of the right middle lobe.  There was stable appearance of normal to top-normal sized  mediastinal lymph nodes. There were coronary artery  calcifications. There was calcification and ectasia of the  ascending thoracic aorta measuring up to 3.4 cm. The patient has  had a prior cholecystectomy. There were mild degenerative changes  in the thoracic spine without any destructive lesions.  2. CT scan of the chest without IV contrast from 12/23/2010 showed  interval decrease in the size of the left lower lobe nodules and  focus of ground-glass opacity at the left upper lobe. The right  middle lobe nodularity was stable. 3. CT scan of the chest without IV contrast from 12/26/2011 was compared with the prior CT scan from 12/23/2010.  There were stable bilateral pulmonary nodules felt to be most likely benign and unchanged compared with prior  studies.   IMPRESSION AND PLAN:  Mrs. Gelardi continues to do well with no significant changes in her clinical status or quantitative immunoglobulins.  The patient's monoclonal protein is most likely benign.  We will plan to see her again in 1 year at which would at which time we will check CBC, chemistries, and quantitative immunoglobulins.    ______________________________ Samul Dada, M.D. DSM/MEDQ  D:  04/03/2012  T:  04/04/2012  Job:  [id number]

## 2012-04-19 ENCOUNTER — Ambulatory Visit: Payer: Self-pay | Admitting: Internal Medicine

## 2012-11-29 ENCOUNTER — Ambulatory Visit: Payer: Self-pay | Admitting: Gastroenterology

## 2012-11-30 LAB — PATHOLOGY REPORT

## 2013-01-02 ENCOUNTER — Encounter: Payer: Self-pay | Admitting: Interventional Cardiology

## 2013-01-02 ENCOUNTER — Ambulatory Visit (INDEPENDENT_AMBULATORY_CARE_PROVIDER_SITE_OTHER): Payer: Medicare Other | Admitting: Interventional Cardiology

## 2013-01-02 VITALS — BP 122/60 | HR 91 | Ht 63.0 in | Wt 140.0 lb

## 2013-01-02 DIAGNOSIS — I2510 Atherosclerotic heart disease of native coronary artery without angina pectoris: Secondary | ICD-10-CM

## 2013-01-02 DIAGNOSIS — I359 Nonrheumatic aortic valve disorder, unspecified: Secondary | ICD-10-CM

## 2013-01-02 DIAGNOSIS — I10 Essential (primary) hypertension: Secondary | ICD-10-CM

## 2013-01-02 DIAGNOSIS — E785 Hyperlipidemia, unspecified: Secondary | ICD-10-CM

## 2013-01-02 DIAGNOSIS — I350 Nonrheumatic aortic (valve) stenosis: Secondary | ICD-10-CM

## 2013-01-02 NOTE — Progress Notes (Signed)
Patient ID: Erin Hunt, female   DOB: [birth year], 79 y.o.   MRN: [id number]    [address] [place], [place]  [address] Phone: [phone] Fax:  [phone]  Date:  01/02/2013   ID:  [employer], DOB December 15, 1928, MRN [id number]  PCP:  Yves Dill, MD   ASSESSMENT:  1. Mild aortic stenosis without clinical symptoms 2. Hypertension, controlled 3. Chronic left bundle branch block 4. Nonobstructive coronary artery disease.  PLAN:  1. Prior cardiac data was reviewed. The patient is doing well and no specific cardiac evaluation is required 2. Followup in one year predominantly for aortic stenosis.   SUBJECTIVE: Erin Hunt is a 79 y.o. female who has no cardiovascular complaints. She denies dyspnea, syncope, and angina. No lower extremity swelling or exertional intolerance. She denies palpitations. Overall she feels well.   Wt Readings from Last 3 Encounters:  01/02/13 140 lb (63.504 kg)  04/03/12 145 lb 14.4 oz (66.18 kg)  03/25/11 145 lb 11.2 oz (66.089 kg)     Past Medical History  Diagnosis Date  . Anemia   . IgG gammopathy   . Arthritis   . Diverticulosis   . GERD (gastroesophageal reflux disease)   . Allergic rhinitis   . IBS (irritable bowel syndrome)   . Fibromyalgia   . Carpal tunnel syndrome   . Cervical spine degeneration   . CAD (coronary artery disease)   . Hypertension   . Vitamin B12 deficiency     Current Outpatient Prescriptions  Medication Sig Dispense Refill  . amLODipine (NORVASC) 5 MG tablet Take 1 tablet by mouth daily.      [name] atorvastatin (LIPITOR) 20 MG tablet Take 20 mg by mouth daily.        . Biotin 10 MG TABS Take 1 tablet by mouth daily.       . CELEBREX 200 MG capsule Take 1 tablet by mouth daily.      . Cholecalciferol (VITAMIN D3) 1000 UNITS CAPS Take 1 tablet by mouth daily.       . cyclobenzaprine (FLEXERIL) 10 MG tablet Take 10 mg by mouth at bedtime.        . diphenhydrAMINE (BENADRYL) 25 MG tablet Take 25 mg by  mouth every 6 (six) hours as needed.        [name] esomeprazole (NEXIUM) 40 MG capsule Take 40 mg by mouth daily.       [name] estropipate (OGEN) 1.5 MG tablet Take 1.5 mg by mouth daily.        . fexofenadine (ALLEGRA) 180 MG tablet Take 180 mg by mouth daily.      . hyoscyamine (LEVSIN SL) 0.125 MG SL tablet 2-4 tabs daily as needed       . LOVAZA 1 G capsule TAKE 1 CAPSULE 3 TIMES A   DAY  270 capsule  3  . magnesium gluconate (MAGONATE) 500 MG tablet Take 500 mg by mouth daily.       . valsartan (DIOVAN) 160 MG tablet Take 160 mg by mouth daily.      . vitamin E 100 UNIT capsule Take 300 Units by mouth daily.       No current facility-administered medications for this visit.    Allergies:    Allergies  Allergen Reactions  . Sulfonamide Derivatives     REACTION: nausea    Social History:  The patient  reports that she has never smoked. She does not have any smokeless tobacco history on file.  ROS:  Please see the history of present illness.   All other systems reviewed and negative.   OBJECTIVE: VS:  BP 122/60  Pulse 91  Ht 5\' 3"  (1.6 m)  Wt 140 lb (63.504 kg)  BMI 24.81 kg/m2 Well nourished, well developed, in no acute distress, younger than stated age HEENT: normal Neck: JVD flat. Carotid bruit right transmitted bruit from the aortic valve  Cardiac:  normal S1, S2; RRR; no murmur Lungs:  clear to auscultation bilaterally, no wheezing, rhonchi or rales Abd: soft, nontender, no hepatomegaly Ext: Edema absent. Pulses 2+ Skin: warm and dry Neuro:  CNs 2-12 intact, no focal abnormalities noted  EKG:  Left bundle branch block, normal sinus rhythm, biatrial abnormality, left axis deviation. No change from prior tracings       Signed, Darci Needle III, MD 01/02/2013 2:32 PM  Past Medical History  Hypertension   Hyperlipidemia   Coronary artery disease documented by catheter 2006 with 40% proximal and 50-70% mid LAD. Tortuous segment noted in the LAD.   Widely patent  coronary arteries, normal EF, cardiac cath 02/2008

## 2013-01-02 NOTE — Patient Instructions (Signed)
Your physician recommends that you continue on your current medications as directed. Please refer to the Current Medication list given to you today.  Your physician wants you to follow-up in: 1 year. You will receive a reminder letter in the mail two months in advance. If you don't receive a letter, please call our office to schedule the follow-up appointment.  

## 2013-01-31 ENCOUNTER — Other Ambulatory Visit: Payer: Self-pay | Admitting: *Deleted

## 2013-01-31 MED ORDER — VALSARTAN 160 MG PO TABS
160.0000 mg | ORAL_TABLET | Freq: Every day | ORAL | Status: DC
Start: 2013-01-31 — End: 2015-01-13

## 2013-01-31 MED ORDER — VALSARTAN 160 MG PO TABS: 160.0000 mg | ORAL_TABLET | Freq: Every day | ORAL | Status: DC

## 2013-02-11 ENCOUNTER — Other Ambulatory Visit: Payer: Self-pay

## 2013-02-11 MED ORDER — AMLODIPINE BESYLATE 5 MG PO TABS: 5.0000 mg | ORAL_TABLET | Freq: Every day | ORAL | Status: DC

## 2013-03-25 ENCOUNTER — Emergency Department (HOSPITAL_COMMUNITY): Payer: Medicare Other

## 2013-03-25 ENCOUNTER — Inpatient Hospital Stay (HOSPITAL_COMMUNITY): Payer: Medicare Other

## 2013-03-25 ENCOUNTER — Observation Stay (HOSPITAL_COMMUNITY)
Admission: EM | Admit: 2013-03-25 | Discharge: 2013-03-27 | Disposition: A | Discharge status: Home / Self Care | Payer: Medicare Other | Attending: Internal Medicine | Admitting: Internal Medicine

## 2013-03-25 ENCOUNTER — Encounter (HOSPITAL_COMMUNITY): Payer: Self-pay | Admitting: Emergency Medicine

## 2013-03-25 DIAGNOSIS — B958 Unspecified staphylococcus as the cause of diseases classified elsewhere: Secondary | ICD-10-CM | POA: Insufficient documentation

## 2013-03-25 DIAGNOSIS — I10 Essential (primary) hypertension: Secondary | ICD-10-CM | POA: Diagnosis present

## 2013-03-25 DIAGNOSIS — IMO0001 Reserved for inherently not codable concepts without codable children: Secondary | ICD-10-CM | POA: Insufficient documentation

## 2013-03-25 DIAGNOSIS — K589 Irritable bowel syndrome without diarrhea: Secondary | ICD-10-CM | POA: Insufficient documentation

## 2013-03-25 DIAGNOSIS — G939 Disorder of brain, unspecified: Secondary | ICD-10-CM

## 2013-03-25 DIAGNOSIS — I350 Nonrheumatic aortic (valve) stenosis: Secondary | ICD-10-CM | POA: Diagnosis present

## 2013-03-25 DIAGNOSIS — G459 Transient cerebral ischemic attack, unspecified: Secondary | ICD-10-CM

## 2013-03-25 DIAGNOSIS — I6782 Cerebral ischemia: Secondary | ICD-10-CM

## 2013-03-25 DIAGNOSIS — N3941 Urge incontinence: Secondary | ICD-10-CM | POA: Insufficient documentation

## 2013-03-25 DIAGNOSIS — M129 Arthropathy, unspecified: Secondary | ICD-10-CM | POA: Insufficient documentation

## 2013-03-25 DIAGNOSIS — K219 Gastro-esophageal reflux disease without esophagitis: Secondary | ICD-10-CM | POA: Diagnosis present

## 2013-03-25 DIAGNOSIS — I359 Nonrheumatic aortic valve disorder, unspecified: Secondary | ICD-10-CM | POA: Insufficient documentation

## 2013-03-25 DIAGNOSIS — E539 Vitamin B deficiency, unspecified: Secondary | ICD-10-CM | POA: Diagnosis present

## 2013-03-25 DIAGNOSIS — G5600 Carpal tunnel syndrome, unspecified upper limb: Secondary | ICD-10-CM | POA: Insufficient documentation

## 2013-03-25 DIAGNOSIS — Z79899 Other long term (current) drug therapy: Secondary | ICD-10-CM | POA: Insufficient documentation

## 2013-03-25 DIAGNOSIS — Z79890 Hormone replacement therapy (postmenopausal): Secondary | ICD-10-CM | POA: Insufficient documentation

## 2013-03-25 DIAGNOSIS — E785 Hyperlipidemia, unspecified: Secondary | ICD-10-CM | POA: Insufficient documentation

## 2013-03-25 DIAGNOSIS — R82998 Other abnormal findings in urine: Secondary | ICD-10-CM | POA: Insufficient documentation

## 2013-03-25 DIAGNOSIS — E538 Deficiency of other specified B group vitamins: Secondary | ICD-10-CM | POA: Insufficient documentation

## 2013-03-25 DIAGNOSIS — K5730 Diverticulosis of large intestine without perforation or abscess without bleeding: Secondary | ICD-10-CM | POA: Insufficient documentation

## 2013-03-25 DIAGNOSIS — I447 Left bundle-branch block, unspecified: Secondary | ICD-10-CM | POA: Insufficient documentation

## 2013-03-25 DIAGNOSIS — J309 Allergic rhinitis, unspecified: Secondary | ICD-10-CM | POA: Insufficient documentation

## 2013-03-25 DIAGNOSIS — G609 Hereditary and idiopathic neuropathy, unspecified: Secondary | ICD-10-CM

## 2013-03-25 DIAGNOSIS — D809 Immunodeficiency with predominantly antibody defects, unspecified: Secondary | ICD-10-CM | POA: Insufficient documentation

## 2013-03-25 DIAGNOSIS — I2510 Atherosclerotic heart disease of native coronary artery without angina pectoris: Secondary | ICD-10-CM | POA: Diagnosis present

## 2013-03-25 DIAGNOSIS — G5790 Unspecified mononeuropathy of unspecified lower limb: Secondary | ICD-10-CM | POA: Insufficient documentation

## 2013-03-25 HISTORY — DX: Left bundle-branch block, unspecified: I44.7

## 2013-03-25 LAB — CBC WITH DIFFERENTIAL/PLATELET
Basophils Absolute: 0 10*3/uL (ref 0.0–0.1)
Basophils Relative: 0 % (ref 0–1)
EOS ABS: 0.1 10*3/uL (ref 0.0–0.7)
Eosinophils Relative: 2 % (ref 0–5)
HCT: 40.9 % (ref 36.0–46.0)
HEMOGLOBIN: 14 g/dL (ref 12.0–15.0)
LYMPHS ABS: 1.3 10*3/uL (ref 0.7–4.0)
Lymphocytes Relative: 17 % (ref 12–46)
MCH: 28.8 pg (ref 26.0–34.0)
MCHC: 34.2 g/dL (ref 30.0–36.0)
MCV: 84.2 fL (ref 78.0–100.0)
Monocytes Absolute: 0.6 10*3/uL (ref 0.1–1.0)
Monocytes Relative: 8 % (ref 3–12)
NEUTROS PCT: 73 % (ref 43–77)
Neutro Abs: 5.7 10*3/uL (ref 1.7–7.7)
Platelets: 240 10*3/uL (ref 150–400)
RBC: 4.86 MIL/uL (ref 3.87–5.11)
RDW: 14.2 % (ref 11.5–15.5)
WBC: 7.8 10*3/uL (ref 4.0–10.5)

## 2013-03-25 LAB — COMPREHENSIVE METABOLIC PANEL
ALK PHOS: 66 U/L (ref 39–117)
ALT: 17 U/L (ref 0–35)
AST: 18 U/L (ref 0–37)
Albumin: 3.3 g/dL — ABNORMAL LOW (ref 3.5–5.2)
BILIRUBIN TOTAL: 0.2 mg/dL — AB (ref 0.3–1.2)
BUN: 22 mg/dL (ref 6–23)
CO2: 24 meq/L (ref 19–32)
Calcium: 8.4 mg/dL (ref 8.4–10.5)
Chloride: 96 mEq/L (ref 96–112)
Creatinine, Ser: 1.09 mg/dL (ref 0.50–1.10)
GFR, EST AFRICAN AMERICAN: 53 mL/min — AB (ref >90)
GFR, EST NON AFRICAN AMERICAN: 46 mL/min — AB (ref >90)
GLUCOSE: 130 mg/dL — AB (ref 70–99)
POTASSIUM: 4.7 meq/L (ref 3.7–5.3)
SODIUM: 132 meq/L — AB (ref 137–147)
Total Protein: 6.9 g/dL (ref 6.0–8.3)

## 2013-03-25 LAB — URINALYSIS, ROUTINE W REFLEX MICROSCOPIC
BILIRUBIN URINE: NEGATIVE
Glucose, UA: NEGATIVE mg/dL
HGB URINE DIPSTICK: NEGATIVE
Ketones, ur: NEGATIVE mg/dL
Leukocytes, UA: NEGATIVE
Nitrite: NEGATIVE
Protein, ur: NEGATIVE mg/dL
Specific Gravity, Urine: 1.006 (ref 1.005–1.030)
UROBILINOGEN UA: 0.2 mg/dL (ref 0.0–1.0)
pH: 7 (ref 5.0–8.0)

## 2013-03-25 LAB — LIPID PANEL
Cholesterol: 177 mg/dL (ref 0–200)
HDL: 77 mg/dL (ref >39)
LDL CALC: 84 mg/dL (ref 0–99)
TRIGLYCERIDES: 78 mg/dL (ref <150)
Total CHOL/HDL Ratio: 2.3 RATIO
VLDL: 16 mg/dL (ref 0–40)

## 2013-03-25 LAB — I-STAT TROPONIN, ED: TROPONIN I, POC: 0.01 ng/mL (ref 0.00–0.08)

## 2013-03-25 LAB — TROPONIN I: Troponin I: <0.3 ng/mL (ref <0.30)

## 2013-03-25 MED ORDER — ATORVASTATIN CALCIUM 20 MG PO TABS
20.0000 mg | ORAL_TABLET | Freq: Every day | ORAL | Status: DC
  Administered 2013-03-26: 20 mg via ORAL
  Filled 2013-03-25 (×2): qty 1

## 2013-03-25 MED ORDER — PANTOPRAZOLE SODIUM 40 MG PO TBEC
40.0000 mg | DELAYED_RELEASE_TABLET | Freq: Every day | ORAL | Status: DC
  Administered 2013-03-26 – 2013-03-27 (×2): 40 mg via ORAL

## 2013-03-25 MED ORDER — MONTELUKAST SODIUM 10 MG PO TABS
10.0000 mg | ORAL_TABLET | Freq: Every day | ORAL | Status: DC
  Administered 2013-03-25 – 2013-03-26 (×2): 10 mg via ORAL
  Filled 2013-03-25 (×3): qty 1

## 2013-03-25 MED ORDER — NITROGLYCERIN 0.4 MG SL SUBL: 0.4000 mg | SUBLINGUAL_TABLET | SUBLINGUAL | Status: DC | PRN

## 2013-03-25 MED ORDER — HEPARIN SODIUM (PORCINE) 5000 UNIT/ML IJ SOLN
5000.0000 [IU] | Freq: Three times a day (TID) | INTRAMUSCULAR | Status: DC
  Filled 2013-03-25 (×8): qty 1

## 2013-03-25 MED ORDER — ASPIRIN EC 81 MG PO TBEC
81.0000 mg | DELAYED_RELEASE_TABLET | Freq: Every day | ORAL | Status: DC
  Administered 2013-03-25 – 2013-03-27 (×3): 81 mg via ORAL
  Filled 2013-03-25 (×3): qty 1

## 2013-03-25 NOTE — H&P (Signed)
Date: 03/25/2013               Patient Name:  Erin Hunt MRN: [id number]  DOB: 11-16-1928 Age / Sex: 80 y.o., female   PCP: Perrin Maltese, MD         Medical Service: Internal Medicine Teaching Service         Attending Physician: Dr. Axel Filler, MD    First Contact: Dr. Lum Babe, MD Pager: [phone]  Second Contact: Dr. Clinton Gallant, MD Pager: [phone]       After Hours (After 5p/  First Contact Pager: [phone]  weekends / holidays): Second Contact Pager: [phone]   Chief Complaint: Numbness in left leg  History of Present Illness: Erin Hunt is a 80 y.o. woman w/ pmhx of TIA, B12 deficiency, CAD? (2006 40% proximal and 50-70% mid LAD; 2010 widely patent Coronary arteries), MGUS, and FM who comes to the ED with a cc of left sided numbness. The patient was in her normal state of health until the last few weeks when she has not felt quite right. She has been having intermittent episodes of numbness, tingling, and malaise. She had similar symptoms 6 months ago and was found to be hyponatremic at 133. Since that time she has been using an electrolyte mix which she thinks has helped. However, it has not been helping her recently. This morning she had an acute episode of left leg and arm numbness paresthesia. This was associated with a heavy feeling in her left leg. It had resolved by the time she was seen by the IM team. The patient states that it lasted a couple of hours.    Of note, patient had been prescribe ASA and plavix for secondary prevention after previous TIA. She did not tolerate either medication and is currently not on antiplatelet therapy.   Meds: No current facility-administered medications for this encounter.   Current Outpatient Prescriptions  Medication Sig Dispense Refill  . amLODipine (NORVASC) 5 MG tablet Take 1 tablet (5 mg total) by mouth daily.  90 tablet  3  . atorvastatin (LIPITOR) 20 MG tablet Take 20 mg by mouth daily.        . Biotin 10 MG TABS Take  1 tablet by mouth daily.       . CELEBREX 200 MG capsule Take 1 tablet by mouth daily as needed for mild pain.       . Cholecalciferol (VITAMIN D3) 1000 UNITS CAPS Take 1 tablet by mouth daily.       . cyclobenzaprine (FLEXERIL) 10 MG tablet Take 10 mg by mouth at bedtime.        [name] esomeprazole (NEXIUM) 40 MG capsule Take 40 mg by mouth daily.       [name] estropipate (OGEN) 1.5 MG tablet Take 1.5 mg by mouth daily.        . fexofenadine (ALLEGRA) 180 MG tablet Take 180 mg by mouth daily.      . hyoscyamine (LEVSIN SL) 0.125 MG SL tablet Take 0.125-0.25 mg by mouth every 6 (six) hours as needed for cramping. 2-4 tabs daily as needed      . magnesium gluconate (MAGONATE) 500 MG tablet Take 500 mg by mouth daily.       . montelukast (SINGULAIR) 10 MG tablet Take 10 mg by mouth at bedtime.      . nitroGLYCERIN (NITROSTAT) 0.4 MG SL tablet Place 0.4 mg under the tongue every 5 (five) minutes as needed for chest pain.      [name]  Omega-3 Fatty Acids (FISH OIL PO) Take 1 capsule by mouth 2 (two) times daily.      . valsartan (DIOVAN) 160 MG tablet Take 1 tablet (160 mg total) by mouth daily.  5 tablet  0  . vitamin E 100 UNIT capsule Take 300 Units by mouth daily.        Allergies: Allergies as of 03/25/2013 - Review Complete 03/25/2013  Allergen Reaction Noted  . Sulfonamide derivatives Nausea Only 01/21/2008   Past Medical History  Diagnosis Date  . Anemia   . IgG gammopathy   . Arthritis   . Diverticulosis   . GERD (gastroesophageal reflux disease)   . Allergic rhinitis   . IBS (irritable bowel syndrome)   . Fibromyalgia   . Carpal tunnel syndrome   . Cervical spine degeneration   . CAD (coronary artery disease)   . Hypertension   . Vitamin B12 deficiency   . LBBB (left bundle branch block)    Past Surgical History  Procedure Laterality Date  . Tsa    . Vesicovaginal fistula closure w/ tah    . Cardiac catheterization    . Cholecystectomy     Family History  Problem Relation Age of  Onset  . Diverticulosis Mother   . Coronary artery disease Father   . Heart disease Paternal Aunt   . Heart disease Paternal Uncle   . Breast cancer Paternal Aunt   . Prostate cancer Brother   . Leukemia Brother    History   Social History  . Marital Status: Widowed    Spouse Name: N/A    Number of Children: N/A  . Years of Education: N/A   Occupational History  . Retired     Immunologist   Social History Main Topics  . Smoking status: Never Smoker   . Smokeless tobacco: Not on file  . Alcohol Use: No  . Drug Use: Not on file  . Sexual Activity: Not on file   Other Topics Concern  . Not on file   Social History Narrative  . No narrative on file    Review of Systems: Review of Systems  Constitutional: Positive for weight loss and malaise/fatigue. Negative for fever, chills and diaphoresis.       12 lb weight loss over last year  HENT: Negative for congestion, hearing loss, nosebleeds and sore throat.   Eyes: Positive for blurred vision. Negative for double vision, photophobia, pain, discharge and redness.  Respiratory: Negative for cough, hemoptysis, sputum production, shortness of breath, wheezing and stridor.   Cardiovascular: Negative for chest pain, palpitations, orthopnea, claudication, leg swelling and PND.  Gastrointestinal: Positive for heartburn. Negative for nausea, vomiting, abdominal pain, diarrhea, constipation, blood in stool and melena.  Genitourinary: Negative for dysuria, urgency and frequency.  Musculoskeletal: Negative for back pain, myalgias and neck pain.  Skin: Negative for itching and rash.  Neurological: Positive for tingling and sensory change. Negative for speech change, focal weakness, seizures, loss of consciousness, weakness and headaches.     Physical Exam: Blood pressure 157/91, pulse 96, temperature 98 F (36.7 C), temperature source Oral, resp. rate 14, height 5\' 3"  (1.6 m), weight 138 lb (62.596 kg), SpO2 97.00%. Physical  Exam  Constitutional: She is oriented to person, place, and time. She appears well-developed and well-nourished. No distress.  HENT:  Head: Normocephalic.  Mouth/Throat: Oropharynx is clear and moist. No oropharyngeal exudate.  Eyes: EOM are normal. Pupils are equal, round, and reactive to light. Right eye exhibits no  discharge. Left eye exhibits no discharge. No scleral icterus.  Neck: Normal range of motion. Neck supple. No JVD present. No tracheal deviation present. No thyromegaly present.  Cardiovascular: Normal rate, regular rhythm, normal heart sounds and intact distal pulses.  Exam reveals no gallop and no friction rub.   No murmur heard. Borderline tachcyardia  Pulmonary/Chest: Effort normal and breath sounds normal. No stridor. No respiratory distress. She has no wheezes. She has no rales. She exhibits no tenderness.  Abdominal: Soft. Bowel sounds are normal. She exhibits no distension. There is no tenderness.  Lymphadenopathy:    She has no cervical adenopathy.  Neurological: She is alert and oriented to person, place, and time. She has normal strength. She displays no tremor. No cranial nerve deficit or sensory deficit. She exhibits normal muscle tone.  Patient is 5/5 in bil UE and LE (Biceps triceps, grip, quads, hips, hamtringl, calf). Sensation intact and equal bil.   Skin: She is not diaphoretic.    Lab results: Basic Metabolic Panel:  Recent Labs  03/25/13 1425  NA 132*  K 4.7  CL 96  CO2 24  GLUCOSE 130*  BUN 22  CREATININE 1.09  CALCIUM 8.4   Liver Function Tests:  Recent Labs  03/25/13 1425  AST 18  ALT 17  ALKPHOS 66  BILITOT 0.2*  PROT 6.9  ALBUMIN 3.3*   CBC:  Recent Labs  03/25/13 1425  WBC 7.8  NEUTROABS 5.7  HGB 14.0  HCT 40.9  MCV 84.2  PLT 240   Urine Drug Screen: Drugs of Abuse     Component Value Date/Time   LABOPIA NONE DETECTED 04/16/2009 1504   COCAINSCRNUR NONE DETECTED 04/16/2009 1504   LABBENZ NONE DETECTED 04/16/2009  1504   AMPHETMU NONE DETECTED 04/16/2009 1504   THCU NONE DETECTED 04/16/2009 1504   LABBARB  Value: NONE DETECTED        DRUG SCREEN FOR MEDICAL PURPOSES ONLY.  IF CONFIRMATION IS NEEDED FOR ANY PURPOSE, NOTIFY LAB WITHIN 5 DAYS.        LOWEST DETECTABLE LIMITS FOR URINE DRUG SCREEN Drug Class       Cutoff (ng/mL) Amphetamine      1000 Barbiturate      200 Benzodiazepine   409 Tricyclics       811 Opiates          300 Cocaine          300 THC              50 04/16/2009 1504    Alcohol Level: No results found for this basename: ETH,  in the last 72 hours Urinalysis: No results found for this basename: COLORURINE, APPERANCEUR, LABSPEC, PHURINE, GLUCOSEU, HGBUR, BILIRUBINUR, KETONESUR, PROTEINUR, UROBILINOGEN, NITRITE, LEUKOCYTESUR,  in the last 72 hours   Imaging results:  Dg Chest 2 View  03/25/2013   CLINICAL DATA:  Weakness.  Shortness of breath.  EXAM: CHEST  2 VIEW  COMPARISON:  Chest x-ray 04/17/2009.  FINDINGS: Lung volumes are normal. No consolidative airspace disease. No pleural effusions. No pneumothorax. No pulmonary nodule or mass noted. Pulmonary vasculature and the cardiomediastinal silhouette are within normal limits. Atherosclerosis in the thoracic aorta. Mild pectus excavatum.  IMPRESSION: 1.  No radiographic evidence of acute cardiopulmonary disease. 2. Atherosclerosis.   Electronically Signed   By: Vinnie Langton M.D.   On: 03/25/2013 16:26   Ct Head Wo Contrast  03/25/2013   CLINICAL DATA:  Weakness, left arm numbness  EXAM: CT HEAD WITHOUT CONTRAST  TECHNIQUE: Contiguous axial images were obtained from the base of the skull through the vertex without intravenous contrast.  COMPARISON:  MR HEAD W/O CM dated 04/17/2009  FINDINGS: There is no evidence of mass effect, midline shift or extra-axial fluid collections. There is no evidence of a space-occupying lesion or intracranial hemorrhage. There is no evidence of a cortical-based area of acute infarction. There is periventricular white  matter low attenuation likely secondary to microangiopathy. There is mild generalized cerebral atrophy.  The ventricles and sulci are appropriate for the patient's age. The basal cisterns are patent.  Visualized portions of the orbits are unremarkable. The visualized portions of the paranasal sinuses and mastoid air cells are unremarkable.  The osseous structures are unremarkable.  IMPRESSION: No acute intracranial pathology.   Electronically Signed   By: Kathreen Devoid   On: 03/25/2013 16:04    Other results: EKG: sinus,  unchanged from previous tracings, Q waves in V1-V4, biatrial enlargment.  Assessment & Plan by Problem: Principal Problem:   TIA (transient ischemic attack) Active Problems:   B12 DEFICIENCY   HYPERLIPIDEMIA   Essential hypertension, benign   GERD   Aortic stenosis   CAD (coronary artery disease), native coronary artery  TIA The patients symptoms are consistent with TIA. Her risk factors are hypertension, HLD, CAD. Her symptoms are completely resolved at this time.  - F/U TTE, Carotid dopplers, MRI - May consider Neurology consult in AM. - Risk stratify with HgA1C, lipid panel, TSH  B12 Def Will check B12 given history.  HLD Checking lipid panel, continue statin therapy  HTN Permissive HTN in setting of possible CVA - Holding ARB and CCB  GERD Continue home PPI  CAD Patient ahs q waves on EKG and history of CAD by cath in 2006. However, she had a widely patent cath in 2010.   Estrogen Replacement Therapy Holding home estrogen (ogen).  DVT: Herparin sq Diet: Heart healthy  Dispo: Disposition is deferred at this time, awaiting improvement of current medical problems. Anticipated discharge in approximately 1-2 day(s).   The patient does have a current PCP Perrin Maltese, MD) and does not need an [facility] hospital follow-up appointment after discharge.  The patient does have transportation limitations that hinder transportation to clinic  appointments.  Signed: Marrion Coy, MD 03/25/2013, 5:47 PM

## 2013-03-25 NOTE — ED Notes (Signed)
Patient with reported numbness in both arms and legs.  Worse in the left side.  Patient is also complaining of blurred vision.  She states she has had shortness of breath and belching as well.  Patient states these sx are unusual for her.  Patient denies pain at present.  She has had intermittent headache.  Patient states her daughter has been inpatient here for 5 weeks.  Patient is seen by Dr Tamala Julian.  Patient has hx of low sodium as well.

## 2013-03-25 NOTE — ED Provider Notes (Signed)
CSN: [id number]     Arrival date & time 03/25/13  1343 History   First MD Initiated Contact with Patient 03/25/13 1506     Chief Complaint  Patient presents with  . Weakness  . Numbness  . Shortness of Breath  . Blurred Vision      HPI Pt was seen at 1510. Per pt, c/o gradual onset and worsening of persistent bilat UE's and LE's weakness for the past 3 days. Pt states her LUE (hand to elbow) and LLE (foot to knee) "numbness" has been worse than her right side. Has been associated with "dull" left sided headache and left eye "blurry vision." States her symptoms last for several hours before resolving. States she feels "the episodes" are "coming on more frequently." States she has been taking "electrolyte supplements" because "I though maybe my sodium was low again" without significant improvement. Endorses hx of TIA, has not been regularly taking her ASA as prescribed "because it makes my belch." Denies CP/palpitations, no SOB/cough, no back pain, no abd pain, no N/V/D, no facial droop, no slurred speech, no dysphagia.     Past Medical History  Diagnosis Date  . Anemia   . IgG gammopathy   . Arthritis   . Diverticulosis   . GERD (gastroesophageal reflux disease)   . Allergic rhinitis   . IBS (irritable bowel syndrome)   . Fibromyalgia   . Carpal tunnel syndrome   . Cervical spine degeneration   . CAD (coronary artery disease)   . Hypertension   . Vitamin B12 deficiency   . LBBB (left bundle branch block)    Past Surgical History  Procedure Laterality Date  . Tsa    . Vesicovaginal fistula closure w/ tah    . Cardiac catheterization    . Cholecystectomy     Family History  Problem Relation Age of Onset  . Diverticulosis Mother   . Coronary artery disease Father   . Heart disease Paternal Aunt   . Heart disease Paternal Uncle   . Breast cancer Paternal Aunt   . Prostate cancer Brother   . Leukemia Brother    History  Substance Use Topics  . Smoking status: Never  Smoker   . Smokeless tobacco: Not on file  . Alcohol Use: No    Review of Systems ROS: Statement: All systems negative except as marked or noted in the HPI; Constitutional: Negative for fever and chills. ; ; Eyes: Negative for eye pain, redness and discharge. ; ; ENMT: Negative for ear pain, hoarseness, nasal congestion, sinus pressure and sore throat. ; ; Cardiovascular: Negative for chest pain, palpitations, diaphoresis, dyspnea and peripheral edema. ; ; Respiratory: Negative for cough, wheezing and stridor. ; ; Gastrointestinal: +belching. Negative for nausea, vomiting, diarrhea, abdominal pain, blood in stool, hematemesis, jaundice and rectal bleeding. . ; ; Genitourinary: Negative for dysuria, flank pain and hematuria. ; ; Musculoskeletal: Negative for back pain and neck pain. Negative for swelling and trauma.; ; Skin: Negative for pruritus, rash, abrasions, blisters, bruising and skin lesion.; ; Neuro: +headache, weakness bilat UE's and LE's, paresthesias, blurry vision. Negative for lightheadedness and neck stiffness. Negative for altered level of consciousness , altered mental status, involuntary movement, seizure and syncope.      Allergies  Sulfonamide derivatives  Home Medications   Current Outpatient Rx  Name  Route  Sig  Dispense  Refill  . amLODipine (NORVASC) 5 MG tablet   Oral   Take 1 tablet (5 mg total) by mouth daily.  90 tablet   3   . atorvastatin (LIPITOR) 20 MG tablet   Oral   Take 20 mg by mouth daily.           . Biotin 10 MG TABS   Oral   Take 1 tablet by mouth daily.          . CELEBREX 200 MG capsule   Oral   Take 1 tablet by mouth daily as needed for mild pain.          . Cholecalciferol (VITAMIN D3) 1000 UNITS CAPS   Oral   Take 1 tablet by mouth daily.          . cyclobenzaprine (FLEXERIL) 10 MG tablet   Oral   Take 10 mg by mouth at bedtime.           [name] esomeprazole (NEXIUM) 40 MG capsule   Oral   Take 40 mg by mouth daily.           [name] estropipate (OGEN) 1.5 MG tablet   Oral   Take 1.5 mg by mouth daily.           . fexofenadine (ALLEGRA) 180 MG tablet   Oral   Take 180 mg by mouth daily.         . hyoscyamine (LEVSIN SL) 0.125 MG SL tablet   Oral   Take 0.125-0.25 mg by mouth every 6 (six) hours as needed for cramping. 2-4 tabs daily as needed         . magnesium gluconate (MAGONATE) 500 MG tablet   Oral   Take 500 mg by mouth daily.          . montelukast (SINGULAIR) 10 MG tablet   Oral   Take 10 mg by mouth at bedtime.         . nitroGLYCERIN (NITROSTAT) 0.4 MG SL tablet   Sublingual   Place 0.4 mg under the tongue every 5 (five) minutes as needed for chest pain.         . Omega-3 Fatty Acids (FISH OIL PO)   Oral   Take 1 capsule by mouth 2 (two) times daily.         . valsartan (DIOVAN) 160 MG tablet   Oral   Take 1 tablet (160 mg total) by mouth daily.   5 tablet   0   . vitamin E 100 UNIT capsule   Oral   Take 300 Units by mouth daily.          BP 157/64  Pulse 90  Temp(Src) 97.7 F (36.5 C) (Oral)  Resp 18  Ht 5\' 3"  (1.6 m)  Wt 138 lb (62.596 kg)  BMI 24.45 kg/m2  SpO2 96% Physical Exam 1515: Physical examination:  Nursing notes reviewed; Vital signs and O2 SAT reviewed;  Constitutional: Well developed, Well nourished, Well hydrated, In no acute distress; Head:  Normocephalic, atraumatic; Eyes: EOMI, PERRL, No scleral icterus; ENMT: Mouth and pharynx normal, Mucous membranes moist; Neck: Supple, Full range of motion, No lymphadenopathy; Cardiovascular: Regular rate and rhythm, No gallop; Respiratory: Breath sounds clear & equal bilaterally, No wheezes.  Speaking full sentences with ease, Normal respiratory effort/excursion; Chest: Nontender, Movement normal; Abdomen: Soft, Nontender, Nondistended, Normal bowel sounds; Genitourinary: No CVA tenderness; Extremities: Pulses normal, No tenderness, No edema, No calf edema or asymmetry.; Neuro: AA&Ox3, Major CN grossly  intact.  Speech clear.  No facial droop.  No nystagmus. Grips equal. Strength 5/5 equal bilat UE's and LE's.  DTR 2/4 equal bilat UE's and LE's.  No gross sensory deficits.  Normal cerebellar testing bilat UE's (finger-nose) and LE's (heel-shin).; Skin: Color normal, Warm, Dry.; Psych:  Anxious.     ED Course  Procedures     EKG Interpretation   Date/Time:  Monday March 25 2013 13:47:02 EST Ventricular Rate:  98 PR Interval:  132 QRS Duration: 114 QT Interval:  366 QTC Calculation: 467 R Axis:   -129 Text Interpretation:  Sinus rhythm with Fusion complexes Baseline wander  Artifact Biatrial enlargement Right superior axis deviation Anterior  infarct , age undetermined Abnormal ECG When compared with ECG of  02/07/2005 QRS axis has changed Confirmed by [facility]  MD, Nunzio Cory [phone])  on 03/25/2013 3:51:05 PM      MDM  MDM Reviewed: previous chart, nursing note and vitals Reviewed previous: labs and ECG Interpretation: labs, ECG, x-ray and CT scan     Results for orders placed during the hospital encounter of 03/25/13  CBC WITH DIFFERENTIAL      Result Value Ref Range   WBC 7.8  4.0 - 10.5 K/uL   RBC 4.86  3.87 - 5.11 MIL/uL   Hemoglobin 14.0  12.0 - 15.0 g/dL   HCT 40.9  36.0 - 46.0 %   MCV 84.2  78.0 - 100.0 fL   MCH 28.8  26.0 - 34.0 pg   MCHC 34.2  30.0 - 36.0 g/dL   RDW 14.2  11.5 - 15.5 %   Platelets 240  150 - 400 K/uL   Neutrophils Relative % 73  43 - 77 %   Neutro Abs 5.7  1.7 - 7.7 K/uL   Lymphocytes Relative 17  12 - 46 %   Lymphs Abs 1.3  0.7 - 4.0 K/uL   Monocytes Relative 8  3 - 12 %   Monocytes Absolute 0.6  0.1 - 1.0 K/uL   Eosinophils Relative 2  0 - 5 %   Eosinophils Absolute 0.1  0.0 - 0.7 K/uL   Basophils Relative 0  0 - 1 %   Basophils Absolute 0.0  0.0 - 0.1 K/uL  COMPREHENSIVE METABOLIC PANEL      Result Value Ref Range   Sodium 132 (*) 137 - 147 mEq/L   Potassium 4.7  3.7 - 5.3 mEq/L   Chloride 96  96 - 112 mEq/L   CO2 24  19 - 32 mEq/L    Glucose, Bld 130 (*) 70 - 99 mg/dL   BUN 22  6 - 23 mg/dL   Creatinine, Ser 1.09  0.50 - 1.10 mg/dL   Calcium 8.4  8.4 - 10.5 mg/dL   Total Protein 6.9  6.0 - 8.3 g/dL   Albumin 3.3 (*) 3.5 - 5.2 g/dL   AST 18  0 - 37 U/L   ALT 17  0 - 35 U/L   Alkaline Phosphatase 66  39 - 117 U/L   Total Bilirubin 0.2 (*) 0.3 - 1.2 mg/dL   GFR calc non Af Amer 46 (*) >90 mL/min   GFR calc Af Amer 53 (*) >90 mL/min  I-STAT TROPOININ, ED      Result Value Ref Range   Troponin i, poc 0.01  0.00 - 0.08 ng/mL   Comment 3            Dg Chest 2 View 03/25/2013   CLINICAL DATA:  Weakness.  Shortness of breath.  EXAM: CHEST  2 VIEW  COMPARISON:  Chest x-ray 04/17/2009.  FINDINGS: Lung volumes are normal.  No consolidative airspace disease. No pleural effusions. No pneumothorax. No pulmonary nodule or mass noted. Pulmonary vasculature and the cardiomediastinal silhouette are within normal limits. Atherosclerosis in the thoracic aorta. Mild pectus excavatum.  IMPRESSION: 1.  No radiographic evidence of acute cardiopulmonary disease. 2. Atherosclerosis.   Electronically Signed   By: Vinnie Langton M.D.   On: 03/25/2013 16:26   Ct Head Wo Contrast 03/25/2013   CLINICAL DATA:  Weakness, left arm numbness  EXAM: CT HEAD WITHOUT CONTRAST  TECHNIQUE: Contiguous axial images were obtained from the base of the skull through the vertex without intravenous contrast.  COMPARISON:  MR HEAD W/O CM dated 04/17/2009  FINDINGS: There is no evidence of mass effect, midline shift or extra-axial fluid collections. There is no evidence of a space-occupying lesion or intracranial hemorrhage. There is no evidence of a cortical-based area of acute infarction. There is periventricular white matter low attenuation likely secondary to microangiopathy. There is mild generalized cerebral atrophy.  The ventricles and sulci are appropriate for the patient's age. The basal cisterns are patent.  Visualized portions of the orbits are unremarkable. The  visualized portions of the paranasal sinuses and mastoid air cells are unremarkable.  The osseous structures are unremarkable.  IMPRESSION: No acute intracranial pathology.   Electronically Signed   By: Kathreen Devoid   On: 03/25/2013 16:04    1715:  No change in neuro assessment. Concern regarding TIA, as pt has not been compliant taking her daily ASA after her previous TIA (states she stopped taking plavix due to "bruising all over" and was told to take ASA daily). Dx and testing d/w pt and family.  Questions answered.  Verb understanding, agreeable to admit. [facility]C to [facility] Resident, case discussed, including:  HPI, pertinent PM/SHx, VS/PE, dx testing, ED course and treatment:  Agreeable to observation admit, requests they will come to ED for eval.    Alfonzo Feller, DO 03/27/13 2026

## 2013-03-25 NOTE — ED Notes (Signed)
Admitting at bedside 

## 2013-03-26 ENCOUNTER — Inpatient Hospital Stay (HOSPITAL_COMMUNITY): Payer: Medicare Other

## 2013-03-26 DIAGNOSIS — I2510 Atherosclerotic heart disease of native coronary artery without angina pectoris: Secondary | ICD-10-CM

## 2013-03-26 DIAGNOSIS — Z8673 Personal history of transient ischemic attack (TIA), and cerebral infarction without residual deficits: Secondary | ICD-10-CM

## 2013-03-26 DIAGNOSIS — R209 Unspecified disturbances of skin sensation: Secondary | ICD-10-CM

## 2013-03-26 DIAGNOSIS — I10 Essential (primary) hypertension: Secondary | ICD-10-CM

## 2013-03-26 DIAGNOSIS — K219 Gastro-esophageal reflux disease without esophagitis: Secondary | ICD-10-CM

## 2013-03-26 DIAGNOSIS — E538 Deficiency of other specified B group vitamins: Secondary | ICD-10-CM

## 2013-03-26 DIAGNOSIS — E785 Hyperlipidemia, unspecified: Secondary | ICD-10-CM

## 2013-03-26 DIAGNOSIS — G459 Transient cerebral ischemic attack, unspecified: Principal | ICD-10-CM

## 2013-03-26 LAB — LIPID PANEL
CHOL/HDL RATIO: 2.3 ratio
CHOLESTEROL: 165 mg/dL (ref 0–200)
HDL: 72 mg/dL (ref >39)
LDL Cholesterol: 81 mg/dL (ref 0–99)
TRIGLYCERIDES: 59 mg/dL (ref <150)
VLDL: 12 mg/dL (ref 0–40)

## 2013-03-26 LAB — HEMOGLOBIN A1C
Hgb A1c MFr Bld: 5.7 % — ABNORMAL HIGH (ref <5.7)
Hgb A1c MFr Bld: 5.8 % — ABNORMAL HIGH (ref <5.7)
MEAN PLASMA GLUCOSE: 117 mg/dL — AB (ref <117)
Mean Plasma Glucose: 120 mg/dL — ABNORMAL HIGH (ref <117)

## 2013-03-26 LAB — TSH: TSH: 1.729 u[IU]/mL (ref 0.350–4.500)

## 2013-03-26 LAB — TROPONIN I
Troponin I: <0.3 ng/mL (ref <0.30)
Troponin I: <0.3 ng/mL (ref <0.30)

## 2013-03-26 LAB — VITAMIN B12: VITAMIN B 12: 647 pg/mL (ref 211–911)

## 2013-03-26 MED ORDER — GADOBENATE DIMEGLUMINE 529 MG/ML IV SOLN
15.0000 mL | Freq: Once | INTRAVENOUS | Status: AC | PRN
  Administered 2013-03-26: 12 mL via INTRAVENOUS

## 2013-03-26 MED ORDER — INFLUENZA VAC SPLIT QUAD 0.5 ML IM SUSP
0.5000 mL | INTRAMUSCULAR | Status: DC
  Filled 2013-03-26: qty 0.5

## 2013-03-26 NOTE — Progress Notes (Signed)
*  PRELIMINARY RESULTS* Vascular Ultrasound Carotid Duplex (Doppler) has been completed.   Findings suggest 1-39% internal carotid artery stenosis bilaterally. Vertebral arteries are patent with antegrade flow.  03/26/2013 11:47 AM Maudry Mayhew, RVT, RDCS, RDMS

## 2013-03-26 NOTE — H&P (Signed)
Internal Medicine Attending Admission Note Date: 03/26/2013  Patient name: Erin Hunt Medical record number: [id number] Date of birth: October 12, 1928 Age: 80 y.o. Gender: female  I saw and evaluated the patient. I reviewed the resident's note and I agree with the resident's findings and plan as documented in the resident's note, with the following additional comments.  Chief Complaint(s): Left arm and left leg numbness  History - key components related to admission: Patient is an 80 year old woman with history of TIA symptoms in 2011, nonobstructive coronary artery disease, MGUS, vitamin B12 deficiency, and other problems as outlined in medical history, admitted with an episode of left arm and left leg numbness with an associated feeling of heaviness in her left leg.  Patient reports having been on aspirin and Plavix in the past, but stopped Plavix because of bruising and has not been taking aspirin recently because of GI side effects.   Physical Exam - key components related to admission:  Filed Vitals:   03/25/13 1845 03/25/13 2009 03/26/13 0502 03/26/13 1012  BP: 135/46 160/64 134/57 151/64  Pulse: 88 93 73 71  Temp:  97.9 F (36.6 C) 98 F (36.7 C) 98.1 F (36.7 C)  TempSrc:  Oral Oral Oral  Resp: 19 20 20 20   Height:      Weight:      SpO2: 96% 97% 95% 97%   General: Alert, no distress Lungs: Clear Heart: Regular; 2/6 systolic murmur; no S3, no S4 Abdomen: Bowel sounds present, soft, nontender Extremities: No edema Neurologic: Alert and oriented; cranial nerves II through XII intact; motor 5 over 5 throughout; sensation intact to light touch  Lab results:   Basic Metabolic Panel:  Recent Labs  03/25/13 1425  NA 132*  K 4.7  CL 96  CO2 24  GLUCOSE 130*  BUN 22  CREATININE 1.09  CALCIUM 8.4    Liver Function Tests:  Recent Labs  03/25/13 1425  AST 18  ALT 17  ALKPHOS 66  BILITOT 0.2*  PROT 6.9  ALBUMIN 3.3*    CBC:  Recent Labs  03/25/13 1425   WBC 7.8  HGB 14.0  HCT 40.9  MCV 84.2  PLT 240    Recent Labs  03/25/13 1425  NEUTROABS 5.7  LYMPHSABS 1.3  MONOABS 0.6  EOSABS 0.1  BASOSABS 0.0    Cardiac Enzymes:  Recent Labs  03/25/13 2247 03/26/13 0439  TROPONINI <0.30 <0.30    Hemoglobin A1C:  Recent Labs  03/25/13 1828  HGBA1C 5.7*    Fasting Lipid Panel:  Recent Labs  03/26/13 0439  CHOL 165  HDL 72  LDLCALC 81  TRIG 59  CHOLHDL 2.3    Thyroid Function Tests:  Recent Labs  03/25/13 1828  TSH 1.729       Urinalysis    Component Value Date/Time   COLORURINE YELLOW 03/25/2013 1806   APPEARANCEUR CLEAR 03/25/2013 1806   LABSPEC 1.006 03/25/2013 1806   PHURINE 7.0 03/25/2013 1806   GLUCOSEU NEGATIVE 03/25/2013 1806   HGBUR NEGATIVE 03/25/2013 1806   HGBUR negative 11/30/2009 1449   BILIRUBINUR NEGATIVE 03/25/2013 1806   KETONESUR NEGATIVE 03/25/2013 1806   PROTEINUR NEGATIVE 03/25/2013 1806   UROBILINOGEN 0.2 03/25/2013 1806   NITRITE NEGATIVE 03/25/2013 1806   LEUKOCYTESUR NEGATIVE 03/25/2013 1806    Imaging results:  Dg Chest 2 View  03/25/2013   CLINICAL DATA:  Weakness.  Shortness of breath.  EXAM: CHEST  2 VIEW  COMPARISON:  Chest x-ray 04/17/2009.  FINDINGS: Lung volumes are normal.  No consolidative airspace disease. No pleural effusions. No pneumothorax. No pulmonary nodule or mass noted. Pulmonary vasculature and the cardiomediastinal silhouette are within normal limits. Atherosclerosis in the thoracic aorta. Mild pectus excavatum.  IMPRESSION: 1.  No radiographic evidence of acute cardiopulmonary disease. 2. Atherosclerosis.   Electronically Signed   By: Vinnie Langton M.D.   On: 03/25/2013 16:26   Ct Head Wo Contrast  03/25/2013   CLINICAL DATA:  Weakness, left arm numbness  EXAM: CT HEAD WITHOUT CONTRAST  TECHNIQUE: Contiguous axial images were obtained from the base of the skull through the vertex without intravenous contrast.  COMPARISON:  MR HEAD W/O CM dated 04/17/2009  FINDINGS: There is no  evidence of mass effect, midline shift or extra-axial fluid collections. There is no evidence of a space-occupying lesion or intracranial hemorrhage. There is no evidence of a cortical-based area of acute infarction. There is periventricular white matter low attenuation likely secondary to microangiopathy. There is mild generalized cerebral atrophy.  The ventricles and sulci are appropriate for the patient's age. The basal cisterns are patent.  Visualized portions of the orbits are unremarkable. The visualized portions of the paranasal sinuses and mastoid air cells are unremarkable.  The osseous structures are unremarkable.  IMPRESSION: No acute intracranial pathology.   Electronically Signed   By: Kathreen Devoid   On: 03/25/2013 16:04    Other results: EKG: Sinus rhythm with Fusion complexes; baseline wander; artifact; biatrial enlargement; right superior axis deviation; anterior infarct , age undetermined  Assessment & Plan by Problem:  1.  Transient ischemic attack.  Patient presented with an acute episode of left arm and leg numbness with associated heaviness of the left leg which resolved after a few hours; symptoms are highly suggestive of TIA.  She was hospitalized in 2011 with symptoms attributed to TIA versus migraine, but has not recently been on antiplatelet therapy; she reports past bruising on Plavix, and GI upset with aspirin.  She has no evident neurologic deficits today.  The plan is MRI of the brain; carotid Dopplers; 2-D echocardiogram; follow neuro status closely; supportive care; aspirin with a PPI if tolerated; consult neurology.  2.  Other problems and plans as per the resident physician's note.

## 2013-03-26 NOTE — Evaluation (Signed)
Speech Language Pathology Evaluation Patient Details Name: Erin Hunt MRN: [id number] DOB: 10/04/1928 Today's Date: 03/26/2013 Time: 1450-1500 SLP Time Calculation (min): 10 min  Problem List:  Patient Active Problem List   Diagnosis Date Noted  . TIA (transient ischemic attack) 03/25/2013  . Aortic stenosis 01/02/2013  . CAD (coronary artery disease), native coronary artery 01/02/2013  . MGUS (monoclonal gammopathy of unknown significance) 03/25/2011  . Lung nodules 08/30/2010  . ABDOMINAL PAIN 11/30/2009  . COUGH, CHRONIC 10/23/2009  . PERIPHERAL NEUROPATHY, FEET 09/16/2009  . ANXIETY, SITUATIONAL 01/28/2009  . FACIAL PAIN 01/28/2009  . POSTMENOPAUSAL STATUS 06/11/2008  . Unspecified vaginitis and vulvovaginitis 05/14/2008  . GERD 03/26/2008  . FREQUENCY, URINARY 03/26/2008  . CARPAL TUNNEL SYNDROME, BILATERAL 01/21/2008  . OSTEOARTHRITIS, HANDS, BILATERAL 01/21/2008  . DEGENERATIVE DISC DISEASE, CERVICAL SPINE 01/21/2008  . B12 DEFICIENCY 12/04/2007  . HYPERLIPIDEMIA 12/04/2007  . Essential hypertension, benign 12/04/2007  . RHINITIS 12/04/2007  . DIVERTICULOSIS OF COLON 12/04/2007  . IRRITABLE BOWEL SYNDROME 12/04/2007  . FIBROMYALGIA 12/04/2007   Past Medical History:  Past Medical History  Diagnosis Date  . Anemia   . IgG gammopathy   . Arthritis   . Diverticulosis   . GERD (gastroesophageal reflux disease)   . Allergic rhinitis   . IBS (irritable bowel syndrome)   . Fibromyalgia   . Carpal tunnel syndrome   . Cervical spine degeneration   . CAD (coronary artery disease)   . Hypertension   . Vitamin B12 deficiency   . LBBB (left bundle branch block)    Past Surgical History:  Past Surgical History  Procedure Laterality Date  . Tsa    . Vesicovaginal fistula closure w/ tah    . Cardiac catheterization    . Cholecystectomy     HPI:  Patient is an 80 year old woman with history of TIA symptoms in 2011, nonobstructive coronary artery disease, MGUS,  vitamin B12 deficiency, and other problems as outlined in medical history, admitted with an episode of left arm and left leg numbness with an associated feeling of heaviness in her left leg.  CT head negative and MRI pending.   Assessment / Plan / Recommendation Clinical Impression  Pt. demonstrated functional cognitive abilities with intelligible verbalizations in conversation.  Pt. without complaints of speech or cognitive difficulty.  Pt. does not require intervention at this time.     SLP Assessment  Patient does not need any further Speech Lanaguage Pathology Services    Follow Up Recommendations  None    Frequency and Duration        Pertinent Vitals/Pain WDL       SLP Evaluation Prior Functioning  Cognitive/Linguistic Baseline: Within functional limits Type of Home: House Available Help at Discharge: Family   Cognition  Overall Cognitive Status: Within Functional Limits for tasks assessed Orientation Level: Oriented X4    Comprehension  Auditory Comprehension Overall Auditory Comprehension: Appears within functional limits for tasks assessed Visual Recognition/Discrimination Discrimination: Not tested Reading Comprehension Reading Status: Not tested    Expression Expression Primary Mode of Expression: Verbal Verbal Expression Overall Verbal Expression: Appears within functional limits for tasks assessed Written Expression Dominant Hand: Right Written Expression: Not tested   Oral / Motor Oral Motor/Sensory Function Overall Oral Motor/Sensory Function: Appears within functional limits for tasks assessed Motor Speech Overall Motor Speech: Appears within functional limits for tasks assessed   GO     Houston Siren M.Ed [employer] [phone]  03/26/2013

## 2013-03-26 NOTE — Consult Note (Signed)
Referring Physician: Joines    Chief Complaint: left sided decreased sensation  HPI:                                                                                                                                         Erin Hunt is an 80 y.o. female who has bilateral LE neuropathy at baseline and states for over 2 years her feet have had decreased sensation and tingling up to her ankles.  Three days ago she noted bilaterally Left>right leg had decreased sensation from the foot to the knee--that said--the symptoms were on and off and waxed and wane.  In addition over the last three days she has noted decreased sensation of the left forearm.  She also mentions a slight HA on the left temple but this has resolved.  In addition to her complaints, she mentions urge incontinence and hiccups. She has history B12 deficiency but has not been on supplements in two years.   Patietn has B12 deficiency and IgG gammopathy   Of note, patient had been prescribe ASA and plavix for secondary prevention after previous TIA. She did not tolerate either medication and is currently not on antiplatelet therapy. ASA caused GI upset and Plavix caused excessive bruising.    Date last known well: Date: 03/24/2013 Time last known well: Unable to determine tPA Given: No: out of window  Past Medical History  Diagnosis Date  . Anemia   . IgG gammopathy   . Arthritis   . Diverticulosis   . GERD (gastroesophageal reflux disease)   . Allergic rhinitis   . IBS (irritable bowel syndrome)   . Fibromyalgia   . Carpal tunnel syndrome   . Cervical spine degeneration   . CAD (coronary artery disease)   . Hypertension   . Vitamin B12 deficiency   . LBBB (left bundle branch block)     Past Surgical History  Procedure Laterality Date  . Tsa    . Vesicovaginal fistula closure w/ tah    . Cardiac catheterization    . Cholecystectomy      Family History  Problem Relation Age of Onset  . Diverticulosis Mother   .  Coronary artery disease Father   . Heart disease Paternal Aunt   . Heart disease Paternal Uncle   . Breast cancer Paternal Aunt   . Prostate cancer Brother   . Leukemia Brother    Social History:  reports that she has never smoked. She does not have any smokeless tobacco history on file. She reports that she does not drink alcohol. Her drug history is not on file.  Allergies:  Allergies  Allergen Reactions  . Sulfonamide Derivatives Nausea Only    Medications:  Prior to Admission:  Prescriptions prior to admission  Medication Sig Dispense Refill  . amLODipine (NORVASC) 5 MG tablet Take 1 tablet (5 mg total) by mouth daily.  90 tablet  3  . atorvastatin (LIPITOR) 20 MG tablet Take 20 mg by mouth daily.        . Biotin 10 MG TABS Take 1 tablet by mouth daily.       . CELEBREX 200 MG capsule Take 1 tablet by mouth daily as needed for mild pain.       . Cholecalciferol (VITAMIN D3) 1000 UNITS CAPS Take 1 tablet by mouth daily.       . cyclobenzaprine (FLEXERIL) 10 MG tablet Take 10 mg by mouth at bedtime.        [name] esomeprazole (NEXIUM) 40 MG capsule Take 40 mg by mouth daily.       [name] estropipate (OGEN) 1.5 MG tablet Take 1.5 mg by mouth daily.        . fexofenadine (ALLEGRA) 180 MG tablet Take 180 mg by mouth daily.      . hyoscyamine (LEVSIN SL) 0.125 MG SL tablet Take 0.125-0.25 mg by mouth every 6 (six) hours as needed for cramping. 2-4 tabs daily as needed      . magnesium gluconate (MAGONATE) 500 MG tablet Take 500 mg by mouth daily.       . montelukast (SINGULAIR) 10 MG tablet Take 10 mg by mouth at bedtime.      . nitroGLYCERIN (NITROSTAT) 0.4 MG SL tablet Place 0.4 mg under the tongue every 5 (five) minutes as needed for chest pain.      . Omega-3 Fatty Acids (FISH OIL PO) Take 1 capsule by mouth 2 (two) times daily.      . valsartan (DIOVAN) 160 MG tablet  Take 1 tablet (160 mg total) by mouth daily.  5 tablet  0  . vitamin E 100 UNIT capsule Take 300 Units by mouth daily.       Scheduled: . aspirin EC  81 mg Oral Daily  . atorvastatin  20 mg Oral q1800  . heparin  5,000 Units Subcutaneous 3 times per day  . montelukast  10 mg Oral QHS  . pantoprazole  40 mg Oral Daily    ROS:                                                                                                                                       History obtained from the patient  General ROS: negative for - chills, fatigue, fever, night sweats, weight gain or weight loss Psychological ROS: negative for - behavioral disorder, hallucinations, memory difficulties, mood swings or suicidal ideation Ophthalmic ROS: negative for - blurry vision, double vision, eye pain or loss of vision ENT ROS: negative for - epistaxis, nasal discharge, oral lesions, sore throat, tinnitus or vertigo Allergy and Immunology ROS: negative for - hives or itchy/watery  eyes Hematological and Lymphatic ROS: negative for - bleeding problems, bruising or swollen lymph nodes Endocrine ROS: negative for - galactorrhea, hair pattern changes, polydipsia/polyuria or temperature intolerance Respiratory ROS: negative for - cough, hemoptysis, shortness of breath or wheezing Cardiovascular ROS: negative for - chest pain, dyspnea on exertion, edema or irregular heartbeat Gastrointestinal ROS: negative for - abdominal pain, diarrhea, hematemesis, nausea/vomiting or stool incontinence Genito-Urinary ROS: negative for - dysuria, hematuria, incontinence or urinary frequency/urgency Musculoskeletal ROS: negative for - joint swelling or muscular weakness Neurological ROS: as noted in HPI Dermatological ROS: negative for rash and skin lesion changes  Neurologic Examination:                                                                                                      Blood pressure 151/64, pulse 71, temperature 98.1  F (36.7 C), temperature source Oral, resp. rate 20, height 5\' 3"  (1.6 m), weight 62.596 kg (138 lb), SpO2 97.00%.  Mental Status: Alert, oriented, thought content appropriate.  Speech fluent without evidence of aphasia.  Able to follow 3 step commands without difficulty. Cranial Nerves: II: Discs flat bilaterally; Visual fields grossly normal, pupils equal, round, reactive to light and accommodation III,IV, VI: ptosis not present, extra-ocular motions intact bilaterally V,VII: smile symmetric, facial light touch sensation normal bilaterally VIII: hearing normal bilaterally IX,X: gag reflex present XI: bilateral shoulder shrug XII: midline tongue extension without atrophy or fasciculations  Motor: Right : Upper extremity   5/5    Left:     Upper extremity   5/5  Lower extremity   5/5     Lower extremity   5/5 Tone and bulk:normal tone throughout; no atrophy noted Sensory: Pinprick and light touch intact throughout, bilaterally, vibratory was decreased on the left ankle, proprioception intact Deep Tendon Reflexes:  Right: Upper Extremity   Left: Upper extremity   biceps (C-5 to C-6) 2/4   biceps (C-5 to C-6) 2/4 tricep (C7) 2/4    triceps (C7) 2/4 Brachioradialis (C6) 2/4  Brachioradialis (C6) 2/4  Lower Extremity Lower Extremity  quadriceps (L-2 to L-4) 2/4   quadriceps (L-2 to L-4) 2/4 Achilles (S1) 1/4   Achilles (S1) 1/4  Plantars: Right: downgoing   Left: downgoing Cerebellar: normal finger-to-nose,  normal heel-to-shin test Gait: not tested.  CV: pulses palpable throughout    Lab Results: Basic Metabolic Panel:  Recent Labs Lab 03/25/13 1425  NA 132*  K 4.7  CL 96  CO2 24  GLUCOSE 130*  BUN 22  CREATININE 1.09  CALCIUM 8.4    Liver Function Tests:  Recent Labs Lab 03/25/13 1425  AST 18  ALT 17  ALKPHOS 66  BILITOT 0.2*  PROT 6.9  ALBUMIN 3.3*   No results found for this basename: LIPASE, AMYLASE,  in the last 168 hours No results found for this  basename: AMMONIA,  in the last 168 hours  CBC:  Recent Labs Lab 03/25/13 1425  WBC 7.8  NEUTROABS 5.7  HGB 14.0  HCT 40.9  MCV 84.2  PLT 240    Cardiac Enzymes:  Recent  Labs Lab 03/25/13 2247 03/26/13 0439  TROPONINI <0.30 <0.30    Lipid Panel:  Recent Labs Lab 03/25/13 1828 03/26/13 0439  CHOL 177 165  TRIG 78 59  HDL 77 72  CHOLHDL 2.3 2.3  VLDL 16 12  LDLCALC 84 81    CBG: No results found for this basename: GLUCAP,  in the last 168 hours  Microbiology: Results for orders placed during the hospital encounter of 04/16/09  CULTURE, BLOOD (ROUTINE X 2)     Status: None   Collection Time    04/16/09  3:30 PM      Result Value Ref Range Status   Specimen Description BLOOD RIGHT HAND   Final   Special Requests BOTTLES DRAWN AEROBIC AND ANAEROBIC 10CC   Final   Culture NO GROWTH 5 DAYS   Final   Report Status 04/22/2009 FINAL   Final  CULTURE, BLOOD (ROUTINE X 2)     Status: None   Collection Time    04/16/09  3:40 PM      Result Value Ref Range Status   Specimen Description BLOOD RIGHT HAND   Final   Special Requests BOTTLES DRAWN AEROBIC AND ANAEROBIC 4CC   Final   Culture NO GROWTH 5 DAYS   Final   Report Status 04/22/2009 FINAL   Final    Coagulation Studies: No results found for this basename: LABPROT, INR,  in the last 72 hours  Imaging: Dg Chest 2 View  03/25/2013   CLINICAL DATA:  Weakness.  Shortness of breath.  EXAM: CHEST  2 VIEW  COMPARISON:  Chest x-ray 04/17/2009.  FINDINGS: Lung volumes are normal. No consolidative airspace disease. No pleural effusions. No pneumothorax. No pulmonary nodule or mass noted. Pulmonary vasculature and the cardiomediastinal silhouette are within normal limits. Atherosclerosis in the thoracic aorta. Mild pectus excavatum.  IMPRESSION: 1.  No radiographic evidence of acute cardiopulmonary disease. 2. Atherosclerosis.   Electronically Signed   By: Vinnie Langton M.D.   On: 03/25/2013 16:26   Ct Head Wo  Contrast  03/25/2013   CLINICAL DATA:  Weakness, left arm numbness  EXAM: CT HEAD WITHOUT CONTRAST  TECHNIQUE: Contiguous axial images were obtained from the base of the skull through the vertex without intravenous contrast.  COMPARISON:  MR HEAD W/O CM dated 04/17/2009  FINDINGS: There is no evidence of mass effect, midline shift or extra-axial fluid collections. There is no evidence of a space-occupying lesion or intracranial hemorrhage. There is no evidence of a cortical-based area of acute infarction. There is periventricular white matter low attenuation likely secondary to microangiopathy. There is mild generalized cerebral atrophy.  The ventricles and sulci are appropriate for the patient's age. The basal cisterns are patent.  Visualized portions of the orbits are unremarkable. The visualized portions of the paranasal sinuses and mastoid air cells are unremarkable.  The osseous structures are unremarkable.  IMPRESSION: No acute intracranial pathology.   Electronically Signed   By: Kathreen Devoid   On: 03/25/2013 16:04       Assessment and plan discussed with with attending physician and they are in agreement.    Etta Quill PA-C [facility] [phone]  03/26/2013, 11:15 AM   Assessment: 80 y.o. female with worsening of her baseline neuropathy on the left leg and forearm.  History is of a progressive onset and not a acute onset and waxing and waning symptoms. Exam shows decreased vibratory sensation bilateral feet, decreased temperature sensation bilateral LE from feet to mid calf.  Otherwise exam  is normal. Differential includes worsening neuropathy from b12 or IgG gammopathy versus (less likely) CVA. Doubt cord involvement but will get MRI cervical spine.  Stroke Risk Factors - hypertension  Recommend: 1) B12 level 2) MRI brain and C-spine with contrast 3) PT/OT 4) Start baby ASA daily  If above is normal would recommend patient be seen as out patient for EMG/NCV testing with  neurology

## 2013-03-26 NOTE — Progress Notes (Signed)
Subjective:  NAE Overnight. Continues to have mild numbness in left leg.  Objective: Vital signs in last 24 hours: Filed Vitals:   03/25/13 1845 03/25/13 2009 03/26/13 0502 03/26/13 1012  BP: 135/46 160/64 134/57 151/64  Pulse: 88 93 73 71  Temp:  97.9 F (36.6 C) 98 F (36.7 C) 98.1 F (36.7 C)  TempSrc:  Oral Oral Oral  Resp: 19 20 20 20   Height:      Weight:      SpO2: 96% 97% 95% 97%   Weight change:   Intake/Output Summary (Last 24 hours) at 03/26/13 1045 Last data filed at 03/26/13 1018  Gross per 24 hour  Intake    480 ml  Output      0 ml  Net    480 ml   Constitutional: She is oriented to person, place, and time. She appears well-developed and well-nourished. No distress.  HENT:  Head: Normocephalic.  Mouth/Throat: Oropharynx is clear and moist. No oropharyngeal exudate.  Eyes: EOM are normal. Pupils are equal, round, and reactive to light. Right eye exhibits no discharge. Left eye exhibits no discharge. No scleral icterus.  Neck: Normal range of motion. Neck supple. No JVD present. No tracheal deviation present. No thyromegaly present.  Cardiovascular: Normal rate, regular rhythm, normal heart sounds and intact distal pulses. Exam reveals no gallop and no friction rub.  No murmur heard. Pulmonary/Chest: Effort normal and breath sounds normal. No stridor. No respiratory distress. She has no wheezes. She has no rales. She exhibits no tenderness.  Abdominal: Soft. Bowel sounds are normal. She exhibits no distension. There is no tenderness.  Lymphadenopathy:  She has no cervical adenopathy.  Neurological: She is alert and oriented to person, place, and time. She has normal strength. She displays no tremor. No cranial nerve deficit or sensory deficit. She exhibits normal muscle tone.  Patient is 5/5 in bil UE and LE (Biceps triceps, grip, quads, hips, hamtringl, calf). Sensation intact and equal bil.  Skin: She is not diaphoretic.    Lab Results: Basic  Metabolic Panel:  Recent Labs Lab 03/25/13 1425  NA 132*  K 4.7  CL 96  CO2 24  GLUCOSE 130*  BUN 22  CREATININE 1.09  CALCIUM 8.4   Liver Function Tests:  Recent Labs Lab 03/25/13 1425  AST 18  ALT 17  ALKPHOS 66  BILITOT 0.2*  PROT 6.9  ALBUMIN 3.3*   CBC:  Recent Labs Lab 03/25/13 1425  WBC 7.8  NEUTROABS 5.7  HGB 14.0  HCT 40.9  MCV 84.2  PLT 240   Cardiac Enzymes:  Recent Labs Lab 03/25/13 2247 03/26/13 0439  TROPONINI <0.30 <0.30   Hemoglobin A1C:  Recent Labs Lab 03/25/13 1828  HGBA1C 5.7*   Fasting Lipid Panel:  Recent Labs Lab 03/26/13 0439  CHOL 165  HDL 72  LDLCALC 81  TRIG 59  CHOLHDL 2.3   Thyroid Function Tests:  Recent Labs Lab 03/25/13 1828  TSH 1.729   Urine Drug Screen: Drugs of Abuse     Component Value Date/Time   LABOPIA NONE DETECTED 04/16/2009 1504   COCAINSCRNUR NONE DETECTED 04/16/2009 1504   LABBENZ NONE DETECTED 04/16/2009 1504   AMPHETMU NONE DETECTED 04/16/2009 1504   THCU NONE DETECTED 04/16/2009 1504   LABBARB  Value: NONE DETECTED        DRUG SCREEN FOR MEDICAL PURPOSES ONLY.  IF CONFIRMATION IS NEEDED FOR ANY PURPOSE, NOTIFY LAB WITHIN 5 DAYS.  LOWEST DETECTABLE LIMITS FOR URINE DRUG SCREEN Drug Class       Cutoff (ng/mL) Amphetamine      1000 Barbiturate      200 Benzodiazepine   [id number] Tricyclics       [id number] Opiates          300 Cocaine          300 THC              50 04/16/2009 1504    Urinalysis:  Recent Labs Lab 03/25/13 1806  COLORURINE YELLOW  LABSPEC 1.006  PHURINE 7.0  GLUCOSEU NEGATIVE  HGBUR NEGATIVE  BILIRUBINUR NEGATIVE  KETONESUR NEGATIVE  PROTEINUR NEGATIVE  UROBILINOGEN 0.2  NITRITE NEGATIVE  LEUKOCYTESUR NEGATIVE   Studies/Results: Dg Chest 2 View  03/25/2013   CLINICAL DATA:  Weakness.  Shortness of breath.  EXAM: CHEST  2 VIEW  COMPARISON:  Chest x-ray 04/17/2009.  FINDINGS: Lung volumes are normal. No consolidative airspace disease. No pleural effusions. No  pneumothorax. No pulmonary nodule or mass noted. Pulmonary vasculature and the cardiomediastinal silhouette are within normal limits. Atherosclerosis in the thoracic aorta. Mild pectus excavatum.  IMPRESSION: 1.  No radiographic evidence of acute cardiopulmonary disease. 2. Atherosclerosis.   Electronically Signed   By: Vinnie Langton M.D.   On: 03/25/2013 16:26   Ct Head Wo Contrast  03/25/2013   CLINICAL DATA:  Weakness, left arm numbness  EXAM: CT HEAD WITHOUT CONTRAST  TECHNIQUE: Contiguous axial images were obtained from the base of the skull through the vertex without intravenous contrast.  COMPARISON:  MR HEAD W/O CM dated 04/17/2009  FINDINGS: There is no evidence of mass effect, midline shift or extra-axial fluid collections. There is no evidence of a space-occupying lesion or intracranial hemorrhage. There is no evidence of a cortical-based area of acute infarction. There is periventricular white matter low attenuation likely secondary to microangiopathy. There is mild generalized cerebral atrophy.  The ventricles and sulci are appropriate for the patient's age. The basal cisterns are patent.  Visualized portions of the orbits are unremarkable. The visualized portions of the paranasal sinuses and mastoid air cells are unremarkable.  The osseous structures are unremarkable.  IMPRESSION: No acute intracranial pathology.   Electronically Signed   By: Kathreen Devoid   On: 03/25/2013 16:04   Medications: I have reviewed the patient's current medications. Scheduled Meds: . aspirin EC  81 mg Oral Daily  . atorvastatin  20 mg Oral q1800  . heparin  5,000 Units Subcutaneous 3 times per day  . montelukast  10 mg Oral QHS  . pantoprazole  40 mg Oral Daily   Continuous Infusions:  PRN Meds:.nitroGLYCERIN Assessment/Plan: Principal Problem:   TIA (transient ischemic attack) Active Problems:   B12 DEFICIENCY   HYPERLIPIDEMIA   Essential hypertension, benign   GERD   Aortic stenosis   CAD (coronary  artery disease), native coronary artery  TIA  The patients symptoms are consistent with TIA. Her risk factors are hypertension, HLD, CAD. Her symptoms are almost completely resolved at this time.  - F/U TTE, Carotid dopplers, MRI  - May consider Neurology consult in AM.  -  HgA1C 5.7, lipid panel LDL 81, TSH 1.73 - Appreciate neuro rec's  B12 Def  B12 pending  HLD  continue statin therapy   HTN  Permissive HTN in setting of possible CVA  - Holding ARB and CCB   GERD  Continue home PPI   CAD  Patient ahs q waves on EKG and history  of CAD by cath in 2006. However, she had a widely patent cath in 2010.   Estrogen Replacement Therapy  Holding home estrogen (ogen).   DVT: Heparin sq   Diet: Heart healthy  Dispo: Disposition is deferred at this time, awaiting improvement of current medical problems.  Anticipated discharge in approximately 1 day(s).   The patient does have a current PCP Perrin Maltese, MD) and does not need an [facility] hospital follow-up appointment after discharge.  The patient does not have transportation limitations that hinder transportation to clinic appointments.  .Services Needed at time of discharge: Y = Yes, Blank = No PT:   OT:   RN:   Equipment:   Other:     LOS: 1 day   Marrion Coy, MD 03/26/2013, 10:45 AM

## 2013-03-26 NOTE — Evaluation (Addendum)
Physical Therapy Evaluation Patient Details Name: Erin Hunt MRN: [id number] DOB: [birth year] Today's Date: 03/26/2013 Time: 3474-2595 PT Time Calculation (min): 25 min  PT Assessment / Plan / Recommendation History of Present Illness  Patient is an 80 year old woman with history of TIA symptoms in 2011, nonobstructive coronary artery disease, MGUS, vitamin B12 deficiency, and other problems as outlined in medical history, admitted with an episode of left arm and left leg numbness with an associated feeling of heaviness in her left leg.  Patient reports having been on aspirin and Plavix in the past, but stopped Plavix because of bruising and has not been taking aspirin recently because of GI side effects.  Clinical Impression  Patient independent with all activity, spoke with patient at length regarding BE FAST education and stroke signs.  Discussed current home exercises regiment and encouraged patient to continue activity as tolerated. No further acute PT needs, will sign off. Patient in agreement.    PT Assessment  Patent does not need any further PT services    Follow Up Recommendations  No PT follow up          Equipment Recommendations  None recommended by PT          Precautions / Restrictions     Pertinent Vitals/Pain No pain at this time      Mobility  Bed Mobility Overal bed mobility: Independent Transfers Overall transfer level: Independent Ambulation/Gait Ambulation/Gait assistance: Independent Ambulation Distance (Feet): 640 Feet Assistive device: None Stairs: Yes Stairs assistance: Independent Stair Management: One rail Right;Alternating pattern Number of Stairs: 5 Modified Rankin (Stroke Patients Only) Pre-Morbid Rankin Score: No symptoms Modified Rankin: No significant disability     PT Goals(Current goals can be found in the care plan section) Acute Rehab PT Goals PT Goal Formulation: No goals set, d/c therapy  Visit Information  Last PT  Received On: 03/26/13 Assistance Needed: +1 History of Present Illness: Patient is an 80 year old woman with history of TIA symptoms in 2011, nonobstructive coronary artery disease, MGUS, vitamin B12 deficiency, and other problems as outlined in medical history, admitted with an episode of left arm and left leg numbness with an associated feeling of heaviness in her left leg.  Patient reports having been on aspirin and Plavix in the past, but stopped Plavix because of bruising and has not been taking aspirin recently because of GI side effects.       Prior [place] expects to be discharged to:: Private residence Living Arrangements: Alone Available Help at Discharge: Family Type of Home: House Home Access: Stairs to enter Technical brewer of Steps: 1 Home Layout: One level (multiple single steps) Home Equipment: None Prior Function Level of Independence: Independent Communication Communication: No difficulties Dominant Hand: Right    Cognition  Cognition Arousal/Alertness: Awake/alert Behavior During Therapy: WFL for tasks assessed/performed Overall Cognitive Status: Within Functional Limits for tasks assessed    Extremity/Trunk Assessment Upper Extremity Assessment Upper Extremity Assessment: Defer to OT evaluation Lower Extremity Assessment Lower Extremity Assessment: Overall WFL for tasks assessed, pt reports some residual paresthesias in LLE  Balance Balance Overall balance assessment: No apparent balance deficits (not formally assessed) Standardized Balance Assessment Standardized Balance Assessment : Dynamic Gait Index Dynamic Gait Index Level Surface: Normal Change in Gait Speed: Normal Gait with Horizontal Head Turns: Normal Gait with Vertical Head Turns: Normal Gait and Pivot Turn: Normal Step Over Obstacle: Normal Step Around Obstacles: Normal Steps: Normal Total Score: 24 General Comments General comments (skin integrity,  edema,  etc.): Educated extensively on BE FAST stroke warning signs, discussed expectations for mobility, patient inquired about current exercise programs. Patient very active individual, encouraged to continue with activity as tolerated.   End of Session PT - End of Session Equipment Utilized During Treatment: Gait belt Activity Tolerance: Patient tolerated treatment well Patient left: in bed;with call bell/phone within reach;with bed alarm set Nurse Communication: Mobility status;Other (comment) (sign off sheet started for independence)  GP     Mar 30, 2013 1400  PT G-Codes **NOT FOR INPATIENT CLASS**  Functional Assessment Tool Used dgi  Functional Limitation Mobility: Walking and moving around  Mobility: Walking and Moving Around Current Status ([id number]) CH  Mobility: Walking and Moving Around Goal Status [phone]) CH  Mobility: Walking and Moving Around Discharge Status [phone]) CH    Duncan Dull Mar 30, 2013, 2:37 PM Alben Deeds, [place] DPT  [phone]

## 2013-03-26 NOTE — Progress Notes (Signed)
Pt requested for tab flexeril that she takes at home but not ordered, Dr Keturah Barre call) paged and notified,said to continue to hold at this time,same related back to pt. Pt also has been refusing her ordered heparin shots, said to be ambulating,same related to Dr Fuller Plan new orders at this time,will however continue to monitor,pt reassured. Obasogie-Asidi, Danyel Griess Efe

## 2013-03-27 DIAGNOSIS — I359 Nonrheumatic aortic valve disorder, unspecified: Secondary | ICD-10-CM

## 2013-03-27 DIAGNOSIS — G609 Hereditary and idiopathic neuropathy, unspecified: Secondary | ICD-10-CM

## 2013-03-27 MED ORDER — ASPIRIN 81 MG PO TBEC: 81.0000 mg | DELAYED_RELEASE_TABLET | Freq: Every day | ORAL | Status: DC

## 2013-03-27 NOTE — Progress Notes (Signed)
Subjective: Patient had no new complaints. Numbness involving left leg is less pronounced and more distal in the leg and foot than yesterday. She has no symptoms involving her right leg and no numbness involving left upper extremity. Patient is ambulating well with no difficulty. She was seen by physical therapy, who felt no further intervention was needed.  Objective: Current vital signs: BP 141/58  Pulse 80  Temp(Src) 97.8 F (36.6 C) (Oral)  Resp 18  Ht 5\' 3"  (1.6 m)  Wt 62.596 kg (138 lb)  BMI 24.45 kg/m2  SpO2 99%  Neurologic Exam: Alert and in no acute distress. Mental status was normal. Speech was normal. Strength of upper and lower extremities was normal proximally and distally, and symmetrical throughout. Deep tendon reflexes were 1+ in the upper extremities and at the knees and absent at the ankles.  Vitamin B12 level was normal.  MRI of the brain and spinal cord, as well as MRA of the brain were unremarkable with no acute abnormalities.  Medications: I have reviewed the patient's current medications.  Assessment/Plan: Although acute exacerbation of chronic peripheral neuropathy, now resolving. Patient is known IgG gammopathy as well as vitamin B12 deficiency.  No further neurological intervention is indicated at this point. I will see her in followup on a when necessary basis after this visit.  C.R. Nicole Kindred, MD [facility] [phone]  03/27/2013  10:26 AM

## 2013-03-27 NOTE — Progress Notes (Signed)
Subjective:  Erin Hunt. Patient is much imrpoved with only mild numbness in left leg/ankle.  Objective: Vital signs in last 24 hours: Filed Vitals:   03/26/13 2024 03/27/13 0127 03/27/13 0602 03/27/13 1037  BP: 149/60 129/62 141/58 148/63  Pulse: 78 81 80 70  Temp: 97.7 F (36.5 C) 97.9 F (36.6 C) 97.8 F (36.6 C) 97.7 F (36.5 C)  TempSrc: Oral Oral Oral Oral  Resp: 18 16 18 18   Height:      Weight:      SpO2: 98% 95% 99% 96%   Weight change:   Intake/Output Summary (Last 24 hours) at 03/27/13 1130 Last data filed at 03/27/13 1000  Gross per 24 hour  Intake    480 ml  Output      0 ml  Net    480 ml   Constitutional: She is oriented to person, place, and time. She appears well-developed and well-nourished. No distress.  HENT:  Head: Normocephalic.  Mouth/Throat: Oropharynx is clear and moist. No oropharyngeal exudate.  Eyes: EOM are normal. Pupils are equal, round, and reactive to light. Right eye exhibits no discharge. Left eye exhibits no discharge. No scleral icterus.  Neck: Normal range of motion. Neck supple. No JVD present. No tracheal deviation present. No thyromegaly present.  Cardiovascular: Normal rate, regular rhythm, normal heart sounds and intact distal pulses. Exam reveals no gallop and no friction rub.  No murmur heard. Pulmonary/Chest: Effort normal and breath sounds normal. No stridor. No respiratory distress. She has no wheezes. She has no rales. She exhibits no tenderness.  Abdominal: Soft. Bowel sounds are normal. She exhibits no distension. There is no tenderness.  Lymphadenopathy:  She has no cervical adenopathy.  Neurological: She is alert and oriented to person, place, and time. She has normal strength. She displays no tremor. No cranial nerve deficit or sensory deficit. She exhibits normal muscle tone.  Patient is 5/5 in bil UE and LE (Biceps triceps, grip, quads, hips, hamtringl, calf). Sensation intact and equal bil.  Skin: She is not  diaphoretic.    Lab Results: Basic Metabolic Panel:  Recent Labs Lab 03/25/13 1425  NA 132*  K 4.7  CL 96  CO2 24  GLUCOSE 130*  BUN 22  CREATININE 1.09  CALCIUM 8.4   Liver Function Tests:  Recent Labs Lab 03/25/13 1425  AST 18  ALT 17  ALKPHOS 66  BILITOT 0.2*  PROT 6.9  ALBUMIN 3.3*   CBC:  Recent Labs Lab 03/25/13 1425  WBC 7.8  NEUTROABS 5.7  HGB 14.0  HCT 40.9  MCV 84.2  PLT 240   Cardiac Enzymes:  Recent Labs Lab 03/25/13 2247 03/26/13 0439 03/26/13 1022  TROPONINI <0.30 <0.30 <0.30   Hemoglobin A1C:  Recent Labs Lab 03/26/13 0439  HGBA1C 5.8*   Fasting Lipid Panel:  Recent Labs Lab 03/26/13 0439  CHOL 165  HDL 72  LDLCALC 81  TRIG 59  CHOLHDL 2.3   Thyroid Function Tests:  Recent Labs Lab 03/25/13 1828  TSH 1.729   Urine Drug Screen: Drugs of Abuse     Component Value Date/Time   LABOPIA NONE DETECTED 04/16/2009 1504   COCAINSCRNUR NONE DETECTED 04/16/2009 1504   LABBENZ NONE DETECTED 04/16/2009 1504   AMPHETMU NONE DETECTED 04/16/2009 1504   THCU NONE DETECTED 04/16/2009 1504   LABBARB  Value: NONE DETECTED        DRUG SCREEN FOR MEDICAL PURPOSES ONLY.  IF CONFIRMATION IS NEEDED FOR ANY PURPOSE, NOTIFY LAB WITHIN  5 DAYS.        LOWEST DETECTABLE LIMITS FOR URINE DRUG SCREEN Drug Class       Cutoff (ng/mL) Amphetamine      1000 Barbiturate      200 Benzodiazepine   [id number] Tricyclics       [id number] Opiates          300 Cocaine          300 THC              50 04/16/2009 1504    Urinalysis:  Recent Labs Lab 03/25/13 1806  COLORURINE YELLOW  LABSPEC 1.006  PHURINE 7.0  GLUCOSEU NEGATIVE  HGBUR NEGATIVE  BILIRUBINUR NEGATIVE  KETONESUR NEGATIVE  PROTEINUR NEGATIVE  UROBILINOGEN 0.2  NITRITE NEGATIVE  LEUKOCYTESUR NEGATIVE   Studies/Results: Dg Chest 2 View  03/25/2013   CLINICAL DATA:  Weakness.  Shortness of breath.  EXAM: CHEST  2 VIEW  COMPARISON:  Chest x-ray 04/17/2009.  FINDINGS: Lung volumes are normal. No  consolidative airspace disease. No pleural effusions. No pneumothorax. No pulmonary nodule or mass noted. Pulmonary vasculature and the cardiomediastinal silhouette are within normal limits. Atherosclerosis in the thoracic aorta. Mild pectus excavatum.  IMPRESSION: 1.  No radiographic evidence of acute cardiopulmonary disease. 2. Atherosclerosis.   Electronically Signed   By: Vinnie Langton M.D.   Hunt: 03/25/2013 16:26   Ct Head Wo Contrast  03/25/2013   CLINICAL DATA:  Weakness, left arm numbness  EXAM: CT HEAD WITHOUT CONTRAST  TECHNIQUE: Contiguous axial images were obtained from the base of the skull through the vertex without intravenous contrast.  COMPARISON:  MR HEAD W/O CM dated 04/17/2009  FINDINGS: There is no evidence of mass effect, midline shift or extra-axial fluid collections. There is no evidence of a space-occupying lesion or intracranial hemorrhage. There is no evidence of a cortical-based area of acute infarction. There is periventricular white matter low attenuation likely secondary to microangiopathy. There is mild generalized cerebral atrophy.  The ventricles and sulci are appropriate for the patient's age. The basal cisterns are patent.  Visualized portions of the orbits are unremarkable. The visualized portions of the paranasal sinuses and mastoid air cells are unremarkable.  The osseous structures are unremarkable.  IMPRESSION: No acute intracranial pathology.   Electronically Signed   By: Kathreen Devoid   Hunt: 03/25/2013 16:04   Mr Brain W Wo Contrast  03/26/2013   CLINICAL DATA:  Progressive lower extremity neuropathy, slowly progressing to involve the left upper extremity. Slight headache which has now resolved. History of B12 deficiency.  EXAM: MRI HEAD WITHOUT AND WITH CONTRAST  TECHNIQUE: Multiplanar, multiecho pulse sequences of the brain and surrounding structures were obtained without and with intravenous contrast.  CONTRAST:  45mL MULTIHANCE GADOBENATE DIMEGLUMINE 529 MG/ML IV  SOLN  COMPARISON:  CT HEAD W/O CM dated 03/25/2013; MR C SPINE WO/W CM dated 03/26/2013; MR MRA HEAD W/O CM dated 03/26/2013; MR HEAD W/O CM dated 04/17/2009  FINDINGS: No evidence for acute infarction, hemorrhage, mass lesion, hydrocephalus, or extra-axial fluid. Advanced atrophy. Mild to moderate subcortical and periventricular white matter signal abnormality, consistent with chronic microvascular ischemic change in this patient with hypertension. Basal ganglia mineralization without foci of chronic hemorrhage. Flow voids are maintained. Post infusion, no abnormal enhancement of the brain or meninges. Negative pituitary and cerebellar tonsils. Visualized calvarium, skull base, and upper cervical osseous structures unremarkable. Mild pannus is noncompressive. Mild cervical spondylosis. Scalp and extracranial soft tissues, orbits, sinuses, and mastoids show no acute  process. Bilateral cataract extraction.  Similar appearance to priors.  IMPRESSION: Atrophy and small vessel disease. No acute intracranial findings. Specifically no evidence for acute stroke or intracranial mass lesion.   Electronically Signed   By: Rolla Flatten M.D.   Hunt: 03/26/2013 20:22   Mr Cervical Spine W Wo Contrast  03/26/2013   CLINICAL DATA:  Bilateral lower extremity neuropathy which is progressive, and now involving the left upper extremity. History of IgG gammopathy. Evaluate for cord compression.  EXAM: MRI CERVICAL SPINE WITHOUT AND WITH CONTRAST  TECHNIQUE: Multiplanar and multiecho pulse sequences of the cervical spine, to include the craniocervical junction and cervicothoracic junction, were obtained according to standard protocol without and with intravenous contrast.  CONTRAST:  63mL MULTIHANCE GADOBENATE DIMEGLUMINE 529 MG/ML IV SOLN  COMPARISON:  MR HEAD WO/W CM dated 03/26/2013; CT HEAD W/O CM dated 04/16/2009; CT SINUS LTD W/O CM dated 02/02/2009; CT HEAD W/O CM dated 03/25/2013; MR MRA HEAD W/O CM dated 03/26/2013  FINDINGS: Mild  straightening of the normal cervical lordosis with slight reversal. No vertebral body compression fracture. Disc space narrowing most notable at C6-7 and C5-6. Craniocervical junction unremarkable. Mild noncompressive pannus.  No worrisome osseous lesions or intraspinal soft tissue collections to suggest complications from IgG gammopathy.  Post infusion, no abnormal enhancement is seen.  The individual disc spaces were examined as follows:  C2-3:  Normal.  C3-4: Central protrusion. Left-sided facet arthropathy and uncinate spurring compress the left C4 nerve root.  C4-5: Central protrusion. Slight effacement anterior subarachnoid space without frank cord flattening. No C5 nerve root impingement. Asymmetric right-sided facet arthropathy.  C5-6: Disc space narrowing with central osteophyte formation annular bulging slightly flattens the cord with canal diameter approximately 7 m. No definite C6 nerve root impingement.  C6-7: Leftward protrusion with uncinate spurring. Borderline C7 nerve root impingement.  C7-T1: Shallow protrusion. Mild facet arthropathy. No impingement.  No definite neck masses.  No definite pathologic adenopathy.  IMPRESSION: Cervical spondylosis without worrisome osseous lesion. It is unclear if pathology at C3-4 Hunt the left could contribute to left arm symptoms. Correlate clinically.   Electronically Signed   By: Rolla Flatten M.D.   Hunt: 03/26/2013 20:35   Mr Jodene Nam Head/brain Wo Cm  03/26/2013   CLINICAL DATA:  Bilateral lower extremity neuropathy, progressing to involve the left upper extremity. History of hypertension.  EXAM: MRA HEAD WITHOUT CONTRAST  TECHNIQUE: Angiographic images of the Circle of Willis were obtained using MRA technique without intravenous contrast.  COMPARISON:  MR C SPINE WO/W CM dated 03/26/2013; MR HEAD WO/W CM dated 03/26/2013; CT HEAD W/O CM dated 03/25/2013; MR HEAD W/O CM dated 04/17/2009  FINDINGS: The internal carotid arteries are widely patent. The basilar artery is  widely patent. The right vertebral is the dominant contributor with the left vertebral primarily contributing to PICA. There is narrowing of the distal left vertebral segment which likely is hypoplastic but atherosclerotic change not excluded. There is no proximal intracranial stenosis or visible aneurysm. Mild irregularity of the distal MCA and PCA branches suggests intracranial atherosclerotic change.  IMPRESSION: Mild changes of intracranial atherosclerotic disease. No significant proximal flow reducing lesion is evident.   Electronically Signed   By: Rolla Flatten M.D.   Hunt: 03/26/2013 20:27   Medications: I have reviewed the patient's current medications. Scheduled Meds: . aspirin EC  81 mg Oral Daily  . atorvastatin  20 mg Oral q1800  . heparin  5,000 Units Subcutaneous 3 times per day  . influenza  vac split quadrivalent PF  0.5 mL Intramuscular Tomorrow-1000  . montelukast  10 mg Oral QHS  . pantoprazole  40 mg Oral Daily   Continuous Infusions:  PRN Meds:.nitroGLYCERIN Assessment/Plan: Principal Problem:   TIA (transient ischemic attack) Active Problems:   B12 DEFICIENCY   HYPERLIPIDEMIA   Essential hypertension, benign   GERD   Aortic stenosis   CAD (coronary artery disease), native coronary artery  TIA  The patients symptoms are consistent with TIA. Her risk factors are hypertension, HLD, CAD, and previous TIA. Her symptoms are almost completely resolved at this time.  - F/U TTE - Carotid dopplers negative for stenosis, MRI head and neck negative for acute process. - Appreciate neuro rec's of EC coated baby ASA and outpatient referral for EMG and NCV testing. -  HgA1C 5.7, lipid panel LDL 81, TSH 1.73  B12 Def  B12 normal.  HLD  Continue statin therapy   HTN  Restarting ARB and CCB   GERD  Continue home PPI   CAD  Patient has q waves Hunt EKG and history of CAD by cath in 2006. However, she had a widely patent cath in 2010. Appears to be stable.  Estrogen  Replacement Therapy  Holding home estrogen (ogen).   DVT: Heparin sq   Diet: Heart healthy  Dispo: Disposition is deferred at this time, awaiting improvement of current medical problems.  Anticipated discharge in approximately 1 day(s).   The patient does have a current PCP Perrin Maltese, MD) and does not need an [facility] hospital follow-up appointment after discharge.  The patient does not have transportation limitations that hinder transportation to clinic appointments.  .Services Needed at time of discharge: Y = Yes, Blank = No PT:   OT:   RN:   Equipment:   Other:     LOS: 2 days   Marrion Coy, MD 03/27/2013, 11:30 AM

## 2013-03-27 NOTE — Progress Notes (Signed)
OT Cancellation Note  Patient Details Name: Erin Hunt MRN: [id number] DOB: 16-May-1928   Cancelled Treatment:    Reason Eval/Treat Not Completed: OT screened, no needs identified, will sign off  Benito Mccreedy OTR/L [phone] 03/27/2013, 8:46 AM

## 2013-03-27 NOTE — Progress Notes (Signed)
  Echocardiogram 2D Echocardiogram has been performed.  Erin Hunt 03/27/2013, 1:57 PM

## 2013-03-27 NOTE — Discharge Summary (Signed)
Name: Erin Hunt MRN: [id number] DOB: July 12, 1928 80 y.o. PCP: Perrin Maltese, MD  Date of Admission: 03/25/2013  2:45 PM Date of Discharge: 03/27/2013 Attending Physician: Axel Filler, MD  Discharge Diagnosis:  Principal Problem:   TIA (transient ischemic attack) Active Problems:   B12 DEFICIENCY   HYPERLIPIDEMIA   Essential hypertension, benign   GERD   Aortic stenosis   CAD (coronary artery disease), native coronary artery  Discharge Medications:   Medication List    STOP taking these medications       estropipate 1.5 MG tablet  Commonly known as:  OGEN      TAKE these medications       amLODipine 5 MG tablet  Commonly known as:  NORVASC  Take 1 tablet (5 mg total) by mouth daily.     aspirin 81 MG EC tablet  Take 1 tablet (81 mg total) by mouth daily.     atorvastatin 20 MG tablet  Commonly known as:  LIPITOR  Take 20 mg by mouth daily.     Biotin 10 MG Tabs  Take 1 tablet by mouth daily.     CELEBREX 200 MG capsule  Generic drug:  celecoxib  Take 1 tablet by mouth daily as needed for mild pain.     cyclobenzaprine 10 MG tablet  Commonly known as:  FLEXERIL  Take 10 mg by mouth at bedtime.     esomeprazole 40 MG capsule  Commonly known as:  NEXIUM  Take 40 mg by mouth daily.     fexofenadine 180 MG tablet  Commonly known as:  ALLEGRA  Take 180 mg by mouth daily.     FISH OIL PO  Take 1 capsule by mouth 2 (two) times daily.     hyoscyamine 0.125 MG SL tablet  Commonly known as:  LEVSIN SL  Take 0.125-0.25 mg by mouth every 6 (six) hours as needed for cramping. 2-4 tabs daily as needed     magnesium gluconate 500 MG tablet  Commonly known as:  MAGONATE  Take 500 mg by mouth daily.     montelukast 10 MG tablet  Commonly known as:  SINGULAIR  Take 10 mg by mouth at bedtime.     nitroGLYCERIN 0.4 MG SL tablet  Commonly known as:  NITROSTAT  Place 0.4 mg under the tongue every 5 (five) minutes as needed for chest pain.     valsartan  160 MG tablet  Commonly known as:  DIOVAN  Take 1 tablet (160 mg total) by mouth daily.     Vitamin D3 1000 UNITS Caps  Take 1 tablet by mouth daily.     vitamin E 100 UNIT capsule  Take 300 Units by mouth daily.        Disposition and follow-up:   [name] was discharged from [facility] in Stable condition.  At the hospital follow up visit please address:  1.  Leg dysesthesia, TIA, Need for Estrogen therapy, Medical complaince with EC-coated ASA, asymptomatic bactiuria  2.  Labs / imaging needed at time of follow-up: NCV and EMG, UA  3.  Pending labs/ test needing follow-up: Urine culture speciation  Follow-up Appointments:     Follow-up Information   Follow up with [facility], Royetta Crochet, MD On 04/01/2013. (1:45 pm)    Specialty:  Neurology   Contact information:   [place] [address] [phone]       Follow up with Lamonte Sakai, MD On 04/09/2013. (10:30 am)  Specialty:  Internal Medicine   Contact information:   [address] [place] [address] [phone]       Discharge Instructions:  Future Appointments Provider Department Dept Phone   03/29/2013 1:30 PM [facility] 1 [facility] [phone]   03/29/2013 2:00 PM [facility] Covering Provider 1 [facility] [phone]   08/05/2013 9:45 AM [facility] [place] [facility] [phone]      Consultations:   Neurology  Procedures Performed:  Dg Chest 2 View  03/25/2013   CLINICAL DATA:  Weakness.  Shortness of breath.  EXAM: CHEST  2 VIEW  COMPARISON:  Chest x-ray 04/17/2009.  FINDINGS: Lung volumes are normal. No consolidative airspace disease. No pleural effusions. No pneumothorax. No pulmonary nodule or mass noted. Pulmonary vasculature and the cardiomediastinal silhouette are within normal limits. Atherosclerosis in the thoracic aorta. Mild pectus excavatum.  IMPRESSION: 1.  No  radiographic evidence of acute cardiopulmonary disease. 2. Atherosclerosis.   Electronically Signed   By: Trudie Reed M.D.   On: 03/25/2013 16:26   Ct Head Wo Contrast  03/25/2013   CLINICAL DATA:  Weakness, left arm numbness  EXAM: CT HEAD WITHOUT CONTRAST  TECHNIQUE: Contiguous axial images were obtained from the base of the skull through the vertex without intravenous contrast.  COMPARISON:  MR HEAD W/O CM dated 04/17/2009  FINDINGS: There is no evidence of mass effect, midline shift or extra-axial fluid collections. There is no evidence of a space-occupying lesion or intracranial hemorrhage. There is no evidence of a cortical-based area of acute infarction. There is periventricular white matter low attenuation likely secondary to microangiopathy. There is mild generalized cerebral atrophy.  The ventricles and sulci are appropriate for the patient's age. The basal cisterns are patent.  Visualized portions of the orbits are unremarkable. The visualized portions of the paranasal sinuses and mastoid air cells are unremarkable.  The osseous structures are unremarkable.  IMPRESSION: No acute intracranial pathology.   Electronically Signed   By: Elige Ko   On: 03/25/2013 16:04   Mr Brain W Wo Contrast  03/26/2013   CLINICAL DATA:  Progressive lower extremity neuropathy, slowly progressing to involve the left upper extremity. Slight headache which has now resolved. History of B12 deficiency.  EXAM: MRI HEAD WITHOUT AND WITH CONTRAST  TECHNIQUE: Multiplanar, multiecho pulse sequences of the brain and surrounding structures were obtained without and with intravenous contrast.  CONTRAST:  38mL MULTIHANCE GADOBENATE DIMEGLUMINE 529 MG/ML IV SOLN  COMPARISON:  CT HEAD W/O CM dated 03/25/2013; MR C SPINE WO/W CM dated 03/26/2013; MR MRA HEAD W/O CM dated 03/26/2013; MR HEAD W/O CM dated 04/17/2009  FINDINGS: No evidence for acute infarction, hemorrhage, mass lesion, hydrocephalus, or extra-axial fluid. Advanced atrophy.  Mild to moderate subcortical and periventricular white matter signal abnormality, consistent with chronic microvascular ischemic change in this patient with hypertension. Basal ganglia mineralization without foci of chronic hemorrhage. Flow voids are maintained. Post infusion, no abnormal enhancement of the brain or meninges. Negative pituitary and cerebellar tonsils. Visualized calvarium, skull base, and upper cervical osseous structures unremarkable. Mild pannus is noncompressive. Mild cervical spondylosis. Scalp and extracranial soft tissues, orbits, sinuses, and mastoids show no acute process. Bilateral cataract extraction.  Similar appearance to priors.  IMPRESSION: Atrophy and small vessel disease. No acute intracranial findings. Specifically no evidence for acute stroke or intracranial mass lesion.   Electronically Signed   By: Davonna Belling M.D.   On: 03/26/2013 20:22  Mr Cervical Spine W Wo Contrast  03/26/2013   CLINICAL DATA:  Bilateral lower extremity neuropathy which is progressive, and now involving the left upper extremity. History of IgG gammopathy. Evaluate for cord compression.  EXAM: MRI CERVICAL SPINE WITHOUT AND WITH CONTRAST  TECHNIQUE: Multiplanar and multiecho pulse sequences of the cervical spine, to include the craniocervical junction and cervicothoracic junction, were obtained according to standard protocol without and with intravenous contrast.  CONTRAST:  27mL MULTIHANCE GADOBENATE DIMEGLUMINE 529 MG/ML IV SOLN  COMPARISON:  MR HEAD WO/W CM dated 03/26/2013; CT HEAD W/O CM dated 04/16/2009; CT SINUS LTD W/O CM dated 02/02/2009; CT HEAD W/O CM dated 03/25/2013; MR MRA HEAD W/O CM dated 03/26/2013  FINDINGS: Mild straightening of the normal cervical lordosis with slight reversal. No vertebral body compression fracture. Disc space narrowing most notable at C6-7 and C5-6. Craniocervical junction unremarkable. Mild noncompressive pannus.  No worrisome osseous lesions or intraspinal soft tissue  collections to suggest complications from IgG gammopathy.  Post infusion, no abnormal enhancement is seen.  The individual disc spaces were examined as follows:  C2-3:  Normal.  C3-4: Central protrusion. Left-sided facet arthropathy and uncinate spurring compress the left C4 nerve root.  C4-5: Central protrusion. Slight effacement anterior subarachnoid space without frank cord flattening. No C5 nerve root impingement. Asymmetric right-sided facet arthropathy.  C5-6: Disc space narrowing with central osteophyte formation annular bulging slightly flattens the cord with canal diameter approximately 7 m. No definite C6 nerve root impingement.  C6-7: Leftward protrusion with uncinate spurring. Borderline C7 nerve root impingement.  C7-T1: Shallow protrusion. Mild facet arthropathy. No impingement.  No definite neck masses.  No definite pathologic adenopathy.  IMPRESSION: Cervical spondylosis without worrisome osseous lesion. It is unclear if pathology at C3-4 on the left could contribute to left arm symptoms. Correlate clinically.   Electronically Signed   By: Rolla Flatten M.D.   On: 03/26/2013 20:35   Mr Jodene Nam Head/brain Wo Cm  03/26/2013   CLINICAL DATA:  Bilateral lower extremity neuropathy, progressing to involve the left upper extremity. History of hypertension.  EXAM: MRA HEAD WITHOUT CONTRAST  TECHNIQUE: Angiographic images of the Circle of Willis were obtained using MRA technique without intravenous contrast.  COMPARISON:  MR C SPINE WO/W CM dated 03/26/2013; MR HEAD WO/W CM dated 03/26/2013; CT HEAD W/O CM dated 03/25/2013; MR HEAD W/O CM dated 04/17/2009  FINDINGS: The internal carotid arteries are widely patent. The basilar artery is widely patent. The right vertebral is the dominant contributor with the left vertebral primarily contributing to PICA. There is narrowing of the distal left vertebral segment which likely is hypoplastic but atherosclerotic change not excluded. There is no proximal intracranial stenosis  or visible aneurysm. Mild irregularity of the distal MCA and PCA branches suggests intracranial atherosclerotic change.  IMPRESSION: Mild changes of intracranial atherosclerotic disease. No significant proximal flow reducing lesion is evident.   Electronically Signed   By: Rolla Flatten M.D.   On: 03/26/2013 20:27    2D Echo: - Left ventricle: The cavity size was normal. Systolic function was normal. The estimated ejection fraction was in the range of 55% to 60%. Wall motion was normal; there were no regional wall motion abnormalities. - Aortic valve: not well visualized cannot tell if it is tri leaflet There was mild stenosis. Valve area: 1.02cm^2(VTI). Valve area: 0.98cm^2 (Vmax). - Atrial septum: No defect or patent foramen ovale was identified.    Admission HPI: Erin Hunt is a 80 y.o. woman w/ pmhx of  TIA, B12 deficiency, CAD? (2006 40% proximal and 50-70% mid LAD; 2010 widely patent Coronary arteries), MGUS, and FM who comes to the ED with a cc of left sided numbness. The patient was in her normal state of health until the last few weeks when she has not felt quite right. She has been having intermittent episodes of numbness, tingling, and malaise. She had similar symptoms 6 months ago and was found to be hyponatremic at 133. Since that time she has been using an electrolyte mix which she thinks has helped. However, it has not been helping her recently. This morning she had an acute episode of left leg and arm numbness paresthesia. This was associated with a heavy feeling in her left leg. It had resolved by the time she was seen by the IM team. The patient states that it lasted a couple of hours.  Of note, patient had been prescribe ASA and plavix for secondary prevention after previous TIA. She did not tolerate either medication and is currently not on antiplatelet therapy.    Hospital Course by problem list: Principal Problem:   TIA (transient ischemic attack) Active Problems:   B12  DEFICIENCY   HYPERLIPIDEMIA   Essential hypertension, benign   GERD   Aortic stenosis   CAD (coronary artery disease), native coronary artery   TIA  The patients symptoms are consistent with TIA. Her risk factors are hypertension, HLD, CAD, and previous TIA. Her symptoms are almost completely resolved at this time. Carotid dopplers negative for stenosis. MRI head and neck negative for acute process. Echocardiogram revealed normal EF and mild AS. Per neurology, we recommended that the patient take EC coated baby ASA and undergo outpatient EMG and NCV testing.   Asymptomatic Bactiuria Patients UA grew staphylococcus species after 48 hours. As patient was asymptomatic, no antimicrobials were given. I recommend the patient follow up with PCP. Further I recommend PCP to follow up speciation of urine culture.  B12 Def  B12 normal.   HLD  Continued statin therapy   HTN  Restarting ARB and CCB on discharge.   GERD  Stable, continued home PPI.  CAD  Patient has q waves on EKG and history of CAD by cath in 2006. However, she had a widely patent cath in 2010. Appears to be stable on admission.   Estrogen Replacement Therapy  Discontinued home estrogen (ogen). Recommend patient to follow up with PCP regarding the need for this.    Discharge Vitals:   BP 148/63  Pulse 70  Temp(Src) 97.7 F (36.5 C) (Oral)  Resp 18  Ht 5\' 3"  (1.6 m)  Wt 138 lb (62.596 kg)  BMI 24.45 kg/m2  SpO2 96%  Discharge Labs:  No results found for this or any previous visit (from the past 24 hour(s)).  Signed: Marrion Coy, MD 03/27/2013, 4:09 PM   Time Spent on Discharge: 35 minutes Services Ordered on Discharge: None Equipment Ordered on Discharge: None

## 2013-03-27 NOTE — Progress Notes (Signed)
Internal Medicine Attending  Date: 03/27/2013  Patient name: Erin Hunt Medical record number: [id number] Date of birth: 1928/11/23 Age: 80 y.o. Gender: female  I saw and evaluated the patient, and discussed her care on A.M rounds with housestaff.  I reviewed the resident's note by Dr. Margart Sickles and I agree with the resident's findings and plans as documented in his note.

## 2013-03-27 NOTE — Progress Notes (Signed)
Discharge instructions given. Pt verbalized understanding and all questions were answered.  

## 2013-03-29 ENCOUNTER — Ambulatory Visit: Payer: Medicare Other

## 2013-03-29 ENCOUNTER — Other Ambulatory Visit: Payer: Medicare Other

## 2013-03-29 LAB — URINE CULTURE

## 2013-04-04 ENCOUNTER — Ambulatory Visit: Payer: Medicare Other | Admitting: Oncology

## 2013-04-09 ENCOUNTER — Emergency Department (HOSPITAL_COMMUNITY)
Admission: EM | Admit: 2013-04-09 | Discharge: 2013-04-09 | Disposition: A | Discharge status: Home / Self Care | Payer: Medicare Other | Attending: Emergency Medicine | Admitting: Emergency Medicine

## 2013-04-09 ENCOUNTER — Emergency Department (HOSPITAL_COMMUNITY): Payer: Medicare Other

## 2013-04-09 ENCOUNTER — Encounter (HOSPITAL_COMMUNITY): Payer: Self-pay | Admitting: Emergency Medicine

## 2013-04-09 DIAGNOSIS — R0602 Shortness of breath: Secondary | ICD-10-CM | POA: Insufficient documentation

## 2013-04-09 DIAGNOSIS — K589 Irritable bowel syndrome without diarrhea: Secondary | ICD-10-CM | POA: Insufficient documentation

## 2013-04-09 DIAGNOSIS — Z79899 Other long term (current) drug therapy: Secondary | ICD-10-CM | POA: Insufficient documentation

## 2013-04-09 DIAGNOSIS — R0789 Other chest pain: Secondary | ICD-10-CM | POA: Diagnosis present

## 2013-04-09 DIAGNOSIS — Z8709 Personal history of other diseases of the respiratory system: Secondary | ICD-10-CM | POA: Insufficient documentation

## 2013-04-09 DIAGNOSIS — I2510 Atherosclerotic heart disease of native coronary artery without angina pectoris: Secondary | ICD-10-CM | POA: Insufficient documentation

## 2013-04-09 DIAGNOSIS — M6281 Muscle weakness (generalized): Secondary | ICD-10-CM | POA: Insufficient documentation

## 2013-04-09 DIAGNOSIS — I10 Essential (primary) hypertension: Secondary | ICD-10-CM | POA: Diagnosis present

## 2013-04-09 DIAGNOSIS — Z9889 Other specified postprocedural states: Secondary | ICD-10-CM | POA: Insufficient documentation

## 2013-04-09 DIAGNOSIS — Z8669 Personal history of other diseases of the nervous system and sense organs: Secondary | ICD-10-CM | POA: Insufficient documentation

## 2013-04-09 DIAGNOSIS — Z8639 Personal history of other endocrine, nutritional and metabolic disease: Secondary | ICD-10-CM | POA: Insufficient documentation

## 2013-04-09 DIAGNOSIS — R079 Chest pain, unspecified: Secondary | ICD-10-CM

## 2013-04-09 DIAGNOSIS — E785 Hyperlipidemia, unspecified: Secondary | ICD-10-CM | POA: Diagnosis present

## 2013-04-09 DIAGNOSIS — R110 Nausea: Secondary | ICD-10-CM | POA: Insufficient documentation

## 2013-04-09 DIAGNOSIS — Z862 Personal history of diseases of the blood and blood-forming organs and certain disorders involving the immune mechanism: Secondary | ICD-10-CM | POA: Insufficient documentation

## 2013-04-09 DIAGNOSIS — R011 Cardiac murmur, unspecified: Secondary | ICD-10-CM | POA: Insufficient documentation

## 2013-04-09 DIAGNOSIS — K219 Gastro-esophageal reflux disease without esophagitis: Secondary | ICD-10-CM | POA: Insufficient documentation

## 2013-04-09 DIAGNOSIS — Z7982 Long term (current) use of aspirin: Secondary | ICD-10-CM | POA: Insufficient documentation

## 2013-04-09 DIAGNOSIS — R072 Precordial pain: Secondary | ICD-10-CM | POA: Insufficient documentation

## 2013-04-09 DIAGNOSIS — M129 Arthropathy, unspecified: Secondary | ICD-10-CM | POA: Insufficient documentation

## 2013-04-09 LAB — CBC
HEMATOCRIT: 42.9 % (ref 36.0–46.0)
Hemoglobin: 14.9 g/dL (ref 12.0–15.0)
MCH: 29 pg (ref 26.0–34.0)
MCHC: 34.7 g/dL (ref 30.0–36.0)
MCV: 83.5 fL (ref 78.0–100.0)
Platelets: 297 10*3/uL (ref 150–400)
RBC: 5.14 MIL/uL — ABNORMAL HIGH (ref 3.87–5.11)
RDW: 14.4 % (ref 11.5–15.5)
WBC: 6.9 10*3/uL (ref 4.0–10.5)

## 2013-04-09 LAB — BASIC METABOLIC PANEL
BUN: 22 mg/dL (ref 6–23)
CO2: 25 mEq/L (ref 19–32)
CREATININE: 0.97 mg/dL (ref 0.50–1.10)
Calcium: 9.5 mg/dL (ref 8.4–10.5)
Chloride: 97 mEq/L (ref 96–112)
GFR calc Af Amer: 61 mL/min — ABNORMAL LOW (ref >90)
GFR, EST NON AFRICAN AMERICAN: 53 mL/min — AB (ref >90)
Glucose, Bld: 101 mg/dL — ABNORMAL HIGH (ref 70–99)
Potassium: 5 mEq/L (ref 3.7–5.3)
Sodium: 135 mEq/L — ABNORMAL LOW (ref 137–147)

## 2013-04-09 LAB — TROPONIN I

## 2013-04-09 LAB — PRO B NATRIURETIC PEPTIDE: Pro B Natriuretic peptide (BNP): 376.6 pg/mL (ref 0–450)

## 2013-04-09 LAB — I-STAT TROPONIN, ED: Troponin i, poc: 0.01 ng/mL (ref 0.00–0.08)

## 2013-04-09 MED ORDER — ASPIRIN 81 MG PO CHEW
162.0000 mg | CHEWABLE_TABLET | Freq: Once | ORAL | Status: AC
  Administered 2013-04-09: 162 mg via ORAL
  Filled 2013-04-09: qty 2

## 2013-04-09 NOTE — ED Notes (Signed)
Pt reports mid chest and epigastric pain that started approx 1 hour ago, feels like she has indigestion. Reports similar episode two weeks ago, was admitted but no diagnosis made. ekg done at triage. Airway intact.

## 2013-04-09 NOTE — ED Notes (Signed)
Assisted pt to ambulate around the nursing desk, pt said she didn't have any pain nor was she dizzy or light headed.

## 2013-04-09 NOTE — ED Notes (Signed)
MD at bedside. 

## 2013-04-09 NOTE — ED Provider Notes (Addendum)
CSN: [id number]     Arrival date & time 04/09/13  1206 History   First MD Initiated Contact with Patient 04/09/13 1412     Chief Complaint  Patient presents with  . Chest Pain     (Consider location/radiation/quality/duration/timing/severity/associated sxs/prior Treatment) Patient is a 80 y.o. female presenting with chest pain. The history is provided by the patient and a relative.  Chest Pain Associated symptoms: nausea and shortness of breath   Associated symptoms: no back pain, no fever, no headache and not vomiting    patient with onset of the chest pain substernal thought to be indigestion and 11:00. Name became more intense at 11:30. Resolved at 2:00 this afternoon. Chest pain was nonradiating associated with some shortness of breath mild and mild nausea no vomiting. Patient's had previous cardiac She thinks 3 years ago. Followed by cardiology Daneen Schick. Patient was admitted a couple weeks ago for possible TIA. Patient stated chest pain was nonradiating burning aching sharp at times but she's also had some pain over the last few weeks the blow the left breast that radiates towards the mid part of the chest.  Past Medical History  Diagnosis Date  . Anemia   . IgG gammopathy   . Arthritis   . Diverticulosis   . GERD (gastroesophageal reflux disease)   . Allergic rhinitis   . IBS (irritable bowel syndrome)   . Fibromyalgia   . Carpal tunnel syndrome   . Cervical spine degeneration   . CAD (coronary artery disease)   . Hypertension   . Vitamin B12 deficiency   . LBBB (left bundle branch block)    Past Surgical History  Procedure Laterality Date  . Tsa    . Vesicovaginal fistula closure w/ tah    . Cardiac catheterization    . Cholecystectomy     Family History  Problem Relation Age of Onset  . Diverticulosis Mother   . Coronary artery disease Father   . Heart disease Paternal Aunt   . Heart disease Paternal Uncle   . Breast cancer Paternal Aunt   . Prostate cancer  Brother   . Leukemia Brother    History  Substance Use Topics  . Smoking status: Never Smoker   . Smokeless tobacco: Not on file  . Alcohol Use: No   OB History   Grav Para Term Preterm Abortions TAB SAB Ect Mult Living                 Review of Systems  Constitutional: Negative for fever.  HENT: Negative for congestion.   Eyes: Negative for redness.  Respiratory: Positive for shortness of breath.   Cardiovascular: Positive for chest pain.  Gastrointestinal: Positive for nausea. Negative for vomiting.  Genitourinary: Negative for dysuria.  Musculoskeletal: Negative for back pain.  Skin: Negative for rash.  Neurological: Negative for headaches.  Hematological: Does not bruise/bleed easily.  Psychiatric/Behavioral: Negative for confusion.      Allergies  Sulfonamide derivatives  Home Medications   Current Outpatient Rx  Name  Route  Sig  Dispense  Refill  . ALPHA LIPOIC ACID PO   Oral   Take 1 tablet by mouth daily.         [name] amLODipine (NORVASC) 5 MG tablet   Oral   Take 1 tablet (5 mg total) by mouth daily.   90 tablet   3   . aspirin EC 81 MG EC tablet   Oral   Take 1 tablet (81 mg total) by mouth daily.  90 tablet   0   . atorvastatin (LIPITOR) 20 MG tablet   Oral   Take 20 mg by mouth daily.           . Biotin 10 MG TABS   Oral   Take 1 tablet by mouth daily.          . CELEBREX 200 MG capsule   Oral   Take 1 tablet by mouth daily as needed for mild pain.          . Cholecalciferol (VITAMIN D3) 1000 UNITS CAPS   Oral   Take 1 tablet by mouth daily.          . cyclobenzaprine (FLEXERIL) 10 MG tablet   Oral   Take 10 mg by mouth at bedtime.           [name] esomeprazole (NEXIUM) 40 MG capsule   Oral   Take 40 mg by mouth daily.          [name] estropipate (OGEN) 1.5 MG tablet   Oral   Take 1.5 mg by mouth daily.         . fexofenadine (ALLEGRA) 180 MG tablet   Oral   Take 180 mg by mouth daily.         . hyoscyamine  (LEVSIN SL) 0.125 MG SL tablet   Oral   Take 0.125-0.25 mg by mouth every 6 (six) hours as needed for cramping. 2-4 tabs daily as needed         . magnesium gluconate (MAGONATE) 500 MG tablet   Oral   Take 500 mg by mouth daily.          . montelukast (SINGULAIR) 10 MG tablet   Oral   Take 10 mg by mouth at bedtime.         . nitroGLYCERIN (NITROSTAT) 0.4 MG SL tablet   Sublingual   Place 0.4 mg under the tongue every 5 (five) minutes as needed for chest pain.         . Omega-3 Fatty Acids (FISH OIL PO)   Oral   Take 1 capsule by mouth 2 (two) times daily.         . valsartan (DIOVAN) 160 MG tablet   Oral   Take 1 tablet (160 mg total) by mouth daily.   5 tablet   0   . vitamin E 100 UNIT capsule   Oral   Take 300 Units by mouth daily.          BP 146/61  Pulse 90  Temp(Src) 97.6 F (36.4 C) (Oral)  Resp 17  Ht 5\' 3"  (1.6 m)  Wt 137 lb (62.143 kg)  BMI 24.27 kg/m2  SpO2 96% Physical Exam  Nursing note and vitals reviewed. Constitutional: She is oriented to person, place, and time. She appears well-developed and well-nourished. No distress.  HENT:  Head: Normocephalic and atraumatic.  Mouth/Throat: Oropharynx is clear and moist.  Eyes: Conjunctivae and EOM are normal. Pupils are equal, round, and reactive to light.  Neck: Normal range of motion.  Cardiovascular: Normal rate, regular rhythm and normal heart sounds.   No murmur heard. Pulmonary/Chest: Effort normal and breath sounds normal.  Abdominal: Soft. Bowel sounds are normal. There is no tenderness.  Musculoskeletal: Normal range of motion. She exhibits no edema.  Neurological: She is alert and oriented to person, place, and time. No cranial nerve deficit. She exhibits normal muscle tone. Coordination normal.  Skin: Skin is warm. No rash noted.  ED Course  Procedures (including critical care time) Labs Review Labs Reviewed  CBC - Abnormal; Notable for the following:    RBC 5.14 (*)     All other components within normal limits  BASIC METABOLIC PANEL - Abnormal; Notable for the following:    Sodium 135 (*)    Glucose, Bld 101 (*)    GFR calc non Af Amer 53 (*)    GFR calc Af Amer 61 (*)    All other components within normal limits  PRO B NATRIURETIC PEPTIDE  TROPONIN I  I-STAT TROPOININ, ED   Results for orders placed during the hospital encounter of 04/09/13  CBC      Result Value Ref Range   WBC 6.9  4.0 - 10.5 K/uL   RBC 5.14 (*) 3.87 - 5.11 MIL/uL   Hemoglobin 14.9  12.0 - 15.0 g/dL   HCT 42.9  36.0 - 46.0 %   MCV 83.5  78.0 - 100.0 fL   MCH 29.0  26.0 - 34.0 pg   MCHC 34.7  30.0 - 36.0 g/dL   RDW 14.4  11.5 - 15.5 %   Platelets 297  150 - 400 K/uL  BASIC METABOLIC PANEL      Result Value Ref Range   Sodium 135 (*) 137 - 147 mEq/L   Potassium 5.0  3.7 - 5.3 mEq/L   Chloride 97  96 - 112 mEq/L   CO2 25  19 - 32 mEq/L   Glucose, Bld 101 (*) 70 - 99 mg/dL   BUN 22  6 - 23 mg/dL   Creatinine, Ser 0.97  0.50 - 1.10 mg/dL   Calcium 9.5  8.4 - 10.5 mg/dL   GFR calc non Af Amer 53 (*) >90 mL/min   GFR calc Af Amer 61 (*) >90 mL/min  PRO B NATRIURETIC PEPTIDE      Result Value Ref Range   Pro B Natriuretic peptide (BNP) 376.6  0 - 450 pg/mL  TROPONIN I      Result Value Ref Range   Troponin I <0.30  <0.30 ng/mL  I-STAT TROPOININ, ED      Result Value Ref Range   Troponin i, poc 0.01  0.00 - 0.08 ng/mL   Comment 3             Imaging Review Dg Chest 2 View  04/09/2013   CLINICAL DATA:  Mid to lower chest pain  EXAM: CHEST  2 VIEW  COMPARISON:  Chest x-ray of 03/25/2013  FINDINGS: No active infiltrate or effusion is seen. Mediastinal contours are unremarkable and the heart is within upper limits of normal and stable. No bony abnormality is seen.  IMPRESSION: No active cardiopulmonary disease.   Electronically Signed   By: Ivar Drape M.D.   On: 04/09/2013 13:09     EKG Interpretation   Date/Time:  Tuesday April 09 2013 12:16:54 EDT Ventricular Rate:   97 PR Interval:  130 QRS Duration: 114 QT Interval:  356 QTC Calculation: 452 R Axis:   -81 Text Interpretation:  Normal sinus rhythm Left axis deviation Anterior  infarct , age undetermined Abnormal ECG No significant change since last  tracing Confirmed by Khian Remo  MD, Seleen Walter [phone]) on 04/09/2013 3:27:48 PM  Also confirmed by Rogene Houston  MD, Talayla Doyel [phone])  on 04/09/2013 3:48:34 PM      MDM   Final diagnoses:  Chest pain    Patient currently without chest pain. Onset chest pain at about 11:00 in the morning are  resolved around 2:00 in the afternoon so therefore had constant pain for 3 hours. Patient's last cardiac cath was in 2010 which was fairly normal. Followed by cardiology. Patient currently pain free. 1 aspirin earlier today I would be a baby aspirin so given 3 more baby aspirin. Patient's first troponin and 2 hour troponin both negative. EKG without acute changes. Chest x-ray negative for pneumothorax pneumonia or pulmonary edema. Discuss with cardiology they will come and evaluate. Most likely patient will require admission for rule out. Since cardiac cath was 5 years ago.  Cardiology will see him for admission. Patient remained chest pain-free.  Mervin Kung, MD 04/09/13 [phone]   The patient seen by cardiology. Was able to ambulate without any chest discomfort he said she could be discharged if that was the case. She'll followup with her cardiologist.  Mervin Kung, MD 04/09/13 2035

## 2013-04-09 NOTE — H&P (Signed)
Erin Hunt is an 80 y.o. female.   Chief Complaint:  Chest Pain- "Burning" HPI:  The patient is an 80 yo active female with a history of cardiac cath in Feb 2010 revealing non-obstructive CAD with luminal irregularities noted in the right coronary in proximal and mid segment, the proximal left anterior descending, as well as the mid left anterior descending, and the mid circumflex. Recently worked up for TIA with  [place] on 03/27/13: ef 55-60%, Mild AS-AVA 1.02cm^2.   She has a chronic LBBB, fibromyalgia, HTN, GERD, IBS.  She presents with Chest "burning" which began at 1130hrs.  She tried Maalox with no relief.  She felt a little nausea. Pain lasted several hours the spontaneously resolved.  No associated sx. Not made worse with movement or exertion.   Two days ago she walked a half mile and did some yard work.  Yesterday she did the stair stepper for almost 20 minutes without difficulty.  The patient currently denies vomiting, fever, shortness of breath, orthopnea, dizziness, PND, cough, congestion, abdominal pain, hematochezia, melena, lower extremity edema, claudication.  Medications: Prior to Admission medications   Medication Sig Start Date End Date Taking? Authorizing Provider  ALPHA LIPOIC ACID PO Take 1 tablet by mouth daily.   Yes Historical Provider, MD  amLODipine (NORVASC) 5 MG tablet Take 1 tablet (5 mg total) by mouth daily. 02/11/13  Yes Belva Crome III, MD  aspirin EC 81 MG EC tablet Take 1 tablet (81 mg total) by mouth daily. 03/27/13  Yes Marrion Coy, MD  atorvastatin (LIPITOR) 20 MG tablet Take 20 mg by mouth daily.     Yes Historical Provider, MD  Biotin 10 MG TABS Take 1 tablet by mouth daily.    Yes Historical Provider, MD  CELEBREX 200 MG capsule Take 1 tablet by mouth daily as needed for mild pain.  08/10/10  Yes Historical Provider, MD  Cholecalciferol (VITAMIN D3) 1000 UNITS CAPS Take 1 tablet by mouth daily.    Yes Historical Provider, MD  cyclobenzaprine  (FLEXERIL) 10 MG tablet Take 10 mg by mouth at bedtime.     Yes Historical Provider, MD  esomeprazole (NEXIUM) 40 MG capsule Take 40 mg by mouth daily.    Yes Historical Provider, MD  estropipate (OGEN) 1.5 MG tablet Take 1.5 mg by mouth daily. 04/01/13  Yes Historical Provider, MD  fexofenadine (ALLEGRA) 180 MG tablet Take 180 mg by mouth daily.   Yes Historical Provider, MD  hyoscyamine (LEVSIN SL) 0.125 MG SL tablet Take 0.125-0.25 mg by mouth every 6 (six) hours as needed for cramping. 2-4 tabs daily as needed 05/24/10  Yes Historical Provider, MD  magnesium gluconate (MAGONATE) 500 MG tablet Take 500 mg by mouth daily.    Yes Historical Provider, MD  montelukast (SINGULAIR) 10 MG tablet Take 10 mg by mouth at bedtime.   Yes Historical Provider, MD  nitroGLYCERIN (NITROSTAT) 0.4 MG SL tablet Place 0.4 mg under the tongue every 5 (five) minutes as needed for chest pain.   Yes Historical Provider, MD  Omega-3 Fatty Acids (FISH OIL PO) Take 1 capsule by mouth 2 (two) times daily.   Yes Historical Provider, MD  valsartan (DIOVAN) 160 MG tablet Take 1 tablet (160 mg total) by mouth daily. 01/31/13  Yes Belva Crome III, MD  vitamin E 100 UNIT capsule Take 300 Units by mouth daily.   Yes Historical Provider, MD      Past Medical History  Diagnosis Date  . Anemia   .  IgG gammopathy   . Arthritis   . Diverticulosis   . GERD (gastroesophageal reflux disease)   . Allergic rhinitis   . IBS (irritable bowel syndrome)   . Fibromyalgia   . Carpal tunnel syndrome   . Cervical spine degeneration   . CAD (coronary artery disease)   . Hypertension   . Vitamin B12 deficiency   . LBBB (left bundle branch block)     Past Surgical History  Procedure Laterality Date  . Tsa    . Vesicovaginal fistula closure w/ tah    . Cardiac catheterization    . Cholecystectomy      Family History  Problem Relation Age of Onset  . Diverticulosis Mother   . Coronary artery disease Father   . Heart disease  Paternal Aunt   . Heart disease Paternal Uncle   . Breast cancer Paternal Aunt   . Prostate cancer Brother   . Leukemia Brother    Social History:  reports that she has never smoked. She does not have any smokeless tobacco history on file. She reports that she does not drink alcohol. Her drug history is not on file.  Allergies:  Allergies  Allergen Reactions  . Sulfonamide Derivatives Nausea Only     (Not in a hospital admission)  Results for orders placed during the hospital encounter of 04/09/13 (from the past 48 hour(s))  CBC     Status: Abnormal   Collection Time    04/09/13 12:35 PM      Result Value Ref Range   WBC 6.9  4.0 - 10.5 K/uL   RBC 5.14 (*) 3.87 - 5.11 MIL/uL   Hemoglobin 14.9  12.0 - 15.0 g/dL   HCT 42.9  36.0 - 46.0 %   MCV 83.5  78.0 - 100.0 fL   MCH 29.0  26.0 - 34.0 pg   MCHC 34.7  30.0 - 36.0 g/dL   RDW 14.4  11.5 - 15.5 %   Platelets 297  150 - 400 K/uL  BASIC METABOLIC PANEL     Status: Abnormal   Collection Time    04/09/13 12:35 PM      Result Value Ref Range   Sodium 135 (*) 137 - 147 mEq/L   Potassium 5.0  3.7 - 5.3 mEq/L   Chloride 97  96 - 112 mEq/L   CO2 25  19 - 32 mEq/L   Glucose, Bld 101 (*) 70 - 99 mg/dL   BUN 22  6 - 23 mg/dL   Creatinine, Ser 0.97  0.50 - 1.10 mg/dL   Calcium 9.5  8.4 - 10.5 mg/dL   GFR calc non Af Amer 53 (*) >90 mL/min   GFR calc Af Amer 61 (*) >90 mL/min   Comment: (NOTE)     The eGFR has been calculated using the CKD EPI equation.     This calculation has not been validated in all clinical situations.     eGFR's persistently <90 mL/min signify possible Chronic Kidney     Disease.  PRO B NATRIURETIC PEPTIDE     Status: None   Collection Time    04/09/13 12:35 PM      Result Value Ref Range   Pro B Natriuretic peptide (BNP) 376.6  0 - 450 pg/mL  I-STAT TROPOININ, ED     Status: None   Collection Time    04/09/13 12:44 PM      Result Value Ref Range   Troponin i, poc 0.01  0.00 - 0.08 ng/mL  Comment 3             Comment: Due to the release kinetics of cTnI,     a negative result within the first hours     of the onset of symptoms does not rule out     myocardial infarction with certainty.     If myocardial infarction is still suspected,     repeat the test at appropriate intervals.  TROPONIN I     Status: None   Collection Time    04/09/13  3:04 PM      Result Value Ref Range   Troponin I <0.30  <0.30 ng/mL   Comment:            Due to the release kinetics of cTnI,     a negative result within the first hours     of the onset of symptoms does not rule out     myocardial infarction with certainty.     If myocardial infarction is still suspected,     repeat the test at appropriate intervals.   Dg Chest 2 View  04/09/2013   CLINICAL DATA:  Mid to lower chest pain  EXAM: CHEST  2 VIEW  COMPARISON:  Chest x-ray of 03/25/2013  FINDINGS: No active infiltrate or effusion is seen. Mediastinal contours are unremarkable and the heart is within upper limits of normal and stable. No bony abnormality is seen.  IMPRESSION: No active cardiopulmonary disease.   Electronically Signed   By: Ivar Drape M.D.   On: 04/09/2013 13:09    Review of Systems  Constitutional: Negative for fever and diaphoresis.  HENT: Negative for congestion and sore throat.   Respiratory: Negative for cough, shortness of breath and wheezing.   Cardiovascular: Positive for chest pain. Negative for palpitations, orthopnea, leg swelling and PND.  Gastrointestinal: Positive for nausea. Negative for vomiting, abdominal pain, blood in stool and melena.  Genitourinary: Negative for hematuria.  Musculoskeletal: Negative for myalgias.  Neurological: Positive for weakness (Legs, chronic). Negative for dizziness.  All other systems reviewed and are negative.    Blood pressure 167/74, pulse 87, temperature 97.6 F (36.4 C), temperature source Oral, resp. rate 21, height 5' 3"  (1.6 m), weight 137 lb (62.143 kg), SpO2  96.00%. Physical Exam  Nursing note and vitals reviewed. Constitutional: She is oriented to person, place, and time. She appears well-developed and well-nourished. No distress.  HENT:  Head: Normocephalic and atraumatic.  Mouth/Throat: Oropharyngeal exudate present.  Eyes: EOM are normal. Pupils are equal, round, and reactive to light. No scleral icterus.  Neck: Normal range of motion. Neck supple. No JVD present.  Cardiovascular: Normal rate, regular rhythm, S1 normal and S2 normal.   Murmur heard.  Systolic murmur is present with a grade of 2/6  Pulses:      Radial pulses are 2+ on the right side, and 2+ on the left side.       Dorsalis pedis pulses are 2+ on the right side, and 2+ on the left side.  LSB, radiates to carotids  Respiratory: Effort normal and breath sounds normal. No respiratory distress. She has no wheezes. She has no rales.  GI: Soft. Bowel sounds are normal. She exhibits no distension. There is no tenderness.  Musculoskeletal: She exhibits no edema.  Lymphadenopathy:    She has no cervical adenopathy.  Neurological: She is alert and oriented to person, place, and time. She exhibits normal muscle tone.  Skin: Skin is warm and dry.  Psychiatric: She  has a normal mood and affect.     Assessment/Plan Principal Problem:   Chest pain, atypical Active Problems:   HYPERLIPIDEMIA   Essential hypertension, benign  Plan:  80 yo female presenting with chest "burning".  Negative troponin.  No acute EKG changes.  Sounds GI related.  If she ambulates without pain in the ER, we will send her home with OP follow up in the office.  When will then consider NST.  Tarri Fuller 04/09/2013, 6:53 PM  Patient seen and examined with Luisa Dago, PA-C. We discussed all aspects of the encounter. I agree with the assessment and plan as stated above. Based on her previous 2 caths, lack of exertional symptoms and prolonged CP without a troponin bump, I suspect her CP is non-ischemic. We  will ambulate her around the ER and if she can do that without recurrent CP she can be discharged home tonight to f/u with Dr. Tamala Julian as an outpatient.   Daniel Bensimhon,MD 11:52 PM

## 2013-04-09 NOTE — Discharge Instructions (Signed)
Discharge as per cardiology. Followup of with cardiology in the next few days. Return for any new or worse symptoms or recurrent chest pain.

## 2013-04-09 NOTE — ED Notes (Signed)
MD at bedside. Cards 

## 2013-04-19 ENCOUNTER — Encounter: Payer: Self-pay | Admitting: Interventional Cardiology

## 2013-04-19 ENCOUNTER — Ambulatory Visit (INDEPENDENT_AMBULATORY_CARE_PROVIDER_SITE_OTHER): Payer: Medicare Other | Admitting: Interventional Cardiology

## 2013-04-19 VITALS — BP 143/59 | HR 100 | Ht 63.0 in | Wt 140.0 lb

## 2013-04-19 DIAGNOSIS — E785 Hyperlipidemia, unspecified: Secondary | ICD-10-CM

## 2013-04-19 DIAGNOSIS — I10 Essential (primary) hypertension: Secondary | ICD-10-CM

## 2013-04-19 DIAGNOSIS — I2510 Atherosclerotic heart disease of native coronary artery without angina pectoris: Secondary | ICD-10-CM

## 2013-04-19 DIAGNOSIS — I359 Nonrheumatic aortic valve disorder, unspecified: Secondary | ICD-10-CM

## 2013-04-19 DIAGNOSIS — I350 Nonrheumatic aortic (valve) stenosis: Secondary | ICD-10-CM

## 2013-04-19 NOTE — Progress Notes (Signed)
Patient ID: Erin Hunt, female   DOB: 11-17-1928, 80 y.o.   MRN: [id number]    [address], [address] [place], [place]  [address]  Phone: [phone] Fax:  [phone]  Date:  04/19/2013   ID:  Erin Hunt, DOB 05-08-1928, MRN [id number]  PCP:  Perrin Maltese, MD  ASSESSMENT:    1. Chest pain has not recurred since the emergency room evaluation on 04/09/2013 2. Hypertension, controlled 3. Gastroesophageal reflux disease 4. Left bundle branch block  PLAN:  1. No change in current management. Continue the monitor for any evidence of coronary artery disease symptoms. 2. Continue physical activity as tolerated   SUBJECTIVE: Erin Hunt is a 80 y.o. female who has done quite well. She continues to exercise at least 5 days per week without difficulty. She denies syncope. No episodes of claudication. No recurrence of any neurological complaints to suggest stroke. She did have an emergency room visit for chest burning that lasted several hours and resolved. No EKG or blood markers were noted to be abnormal.   Wt Readings from Last 3 Encounters:  04/19/13 140 lb (63.504 kg)  04/09/13 137 lb (62.143 kg)  03/25/13 138 lb (62.596 kg)     Past Medical History  Diagnosis Date  . Anemia   . IgG gammopathy   . Arthritis   . Diverticulosis   . GERD (gastroesophageal reflux disease)   . Allergic rhinitis   . IBS (irritable bowel syndrome)   . Fibromyalgia   . Carpal tunnel syndrome   . Cervical spine degeneration   . CAD (coronary artery disease)   . Hypertension   . Vitamin B12 deficiency   . LBBB (left bundle branch block)     Current Outpatient Prescriptions  Medication Sig Dispense Refill  . ALPHA LIPOIC ACID PO Take 1 tablet by mouth daily.      [name] amLODipine (NORVASC) 5 MG tablet Take 1 tablet (5 mg total) by mouth daily.  90 tablet  3  . aspirin EC 81 MG EC tablet Take 1 tablet (81 mg total) by mouth daily.  90 tablet  0  . atorvastatin (LIPITOR) 20 MG tablet Take 20 mg  by mouth daily.        . Biotin 10 MG TABS Take 1 tablet by mouth daily.       . CELEBREX 200 MG capsule Take 1 tablet by mouth daily as needed for mild pain.       . Cholecalciferol (VITAMIN D3) 1000 UNITS CAPS Take 1 tablet by mouth daily.       . cyclobenzaprine (FLEXERIL) 10 MG tablet Take 10 mg by mouth at bedtime.        [name] esomeprazole (NEXIUM) 40 MG capsule Take 40 mg by mouth daily.       [name] estropipate (OGEN) 1.5 MG tablet Take 1.5 mg by mouth daily.      . fexofenadine (ALLEGRA) 180 MG tablet Take 180 mg by mouth daily.      . hyoscyamine (LEVSIN SL) 0.125 MG SL tablet Take 0.125-0.25 mg by mouth every 6 (six) hours as needed for cramping. 2-4 tabs daily as needed      . magnesium gluconate (MAGONATE) 500 MG tablet Take 500 mg by mouth daily.       . montelukast (SINGULAIR) 10 MG tablet Take 10 mg by mouth at bedtime.      . nitroGLYCERIN (NITROSTAT) 0.4 MG SL tablet Place 0.4 mg under the tongue every 5 (five) minutes  as needed for chest pain.      . Omega-3 Fatty Acids (FISH OIL PO) Take 1 capsule by mouth 2 (two) times daily.      . valsartan (DIOVAN) 160 MG tablet Take 1 tablet (160 mg total) by mouth daily.  5 tablet  0  . vitamin E 100 UNIT capsule Take 300 Units by mouth daily.       No current facility-administered medications for this visit.    Allergies:    Allergies  Allergen Reactions  . Sulfonamide Derivatives Nausea Only    Social History:  The patient  reports that she has never smoked. She does not have any smokeless tobacco history on file. She reports that she does not drink alcohol.   ROS:  Please see the history of present illness.   Appetite is good. Weight is been stable. Denies palpitations.   All other systems reviewed and negative.   OBJECTIVE: VS:  BP 143/59  Pulse 100  Ht 5\' 3"  (1.6 m)  Wt 140 lb (63.504 kg)  BMI 24.81 kg/m2 Well nourished, well developed, in no acute distress, appears than his stated age 43: normal Neck: JVD flat. Carotid  bruit 2+ bilateral  Cardiac:  normal S1, S2; RRR; no murmur Lungs:  clear to auscultation bilaterally, no wheezing, rhonchi or rales Abd: soft, nontender, no hepatomegaly Ext: Edema absent. Pulses 2+ bilateral Skin: warm and dry Neuro:  CNs 2-12 intact, no focal abnormalities noted  EKG:  Not repeated       Signed, Illene Labrador III, MD 04/19/2013 10:05 AM

## 2013-04-19 NOTE — Patient Instructions (Signed)
Your physician recommends that you continue on your current medications as directed. Please refer to the Current Medication list given to you today.  Your physician recommends that you schedule a follow-up appointment in: December 2015

## 2013-04-23 NOTE — Progress Notes (Signed)
Late entry -Speech Pathology   04-24-2013 1538  SLP G-Codes **NOT FOR INPATIENT CLASS**  Functional Assessment Tool Used clinical judgement  Functional Limitations Memory  Memory Current Status (G6770) [place]  Memory Goal Status (H4035) [place]  Memory Discharge Status (C4818) [place]  SLP Evaluations  $ SLP Speech Visit 1 Procedure  SLP Evaluations  $ SLP EVAL LANGUAGE/SOUND PRODUCTION 1 Procedure   Orbie Pyo Esme Durkin M.Ed [employer] [phone]  04/23/2013

## 2013-05-09 ENCOUNTER — Telehealth: Payer: Self-pay | Admitting: Internal Medicine

## 2013-05-09 NOTE — Telephone Encounter (Signed)
pt called to sched appt...done...pt aware of d.t °

## 2013-05-24 ENCOUNTER — Ambulatory Visit (HOSPITAL_BASED_OUTPATIENT_CLINIC_OR_DEPARTMENT_OTHER): Payer: Medicare Other | Admitting: Internal Medicine

## 2013-05-24 ENCOUNTER — Other Ambulatory Visit: Payer: Self-pay | Admitting: Internal Medicine

## 2013-05-24 ENCOUNTER — Other Ambulatory Visit (HOSPITAL_BASED_OUTPATIENT_CLINIC_OR_DEPARTMENT_OTHER): Payer: Medicare Other

## 2013-05-24 VITALS — BP 150/61 | HR 94 | Temp 98.1°F | Resp 18 | Ht 63.0 in | Wt 140.4 lb

## 2013-05-24 DIAGNOSIS — D472 Monoclonal gammopathy: Secondary | ICD-10-CM

## 2013-05-24 LAB — COMPREHENSIVE METABOLIC PANEL (CC13)
ALK PHOS: 66 U/L (ref 40–150)
ALT: 17 U/L (ref 0–55)
AST: 16 U/L (ref 5–34)
Albumin: 3.6 g/dL (ref 3.5–5.0)
Anion Gap: 8 mEq/L (ref 3–11)
BUN: 27 mg/dL — AB (ref 7.0–26.0)
CALCIUM: 9.4 mg/dL (ref 8.4–10.4)
CHLORIDE: 104 meq/L (ref 98–109)
CO2: 25 mEq/L (ref 22–29)
CREATININE: 1.1 mg/dL (ref 0.6–1.1)
Glucose: 111 mg/dl (ref 70–140)
Potassium: 4.7 mEq/L (ref 3.5–5.1)
Sodium: 137 mEq/L (ref 136–145)
Total Bilirubin: 0.36 mg/dL (ref 0.20–1.20)
Total Protein: 6.8 g/dL (ref 6.4–8.3)

## 2013-05-24 LAB — CBC WITH DIFFERENTIAL/PLATELET
BASO%: 1.2 % (ref 0.0–2.0)
BASOS ABS: 0.1 10*3/uL (ref 0.0–0.1)
EOS ABS: 0.1 10*3/uL (ref 0.0–0.5)
EOS%: 2.3 % (ref 0.0–7.0)
HEMATOCRIT: 40.1 % (ref 34.8–46.6)
HEMOGLOBIN: 13 g/dL (ref 11.6–15.9)
LYMPH#: 1.5 10*3/uL (ref 0.9–3.3)
LYMPH%: 23.9 % (ref 14.0–49.7)
MCH: 27.5 pg (ref 25.1–34.0)
MCHC: 32.3 g/dL (ref 31.5–36.0)
MCV: 85 fL (ref 79.5–101.0)
MONO#: 0.5 10*3/uL (ref 0.1–0.9)
MONO%: 7.1 % (ref 0.0–14.0)
NEUT%: 65.5 % (ref 38.4–76.8)
NEUTROS ABS: 4.2 10*3/uL (ref 1.5–6.5)
Platelets: 310 10*3/uL (ref 145–400)
RBC: 4.72 10*6/uL (ref 3.70–5.45)
RDW: 14.9 % — ABNORMAL HIGH (ref 11.2–14.5)
WBC: 6.4 10*3/uL (ref 3.9–10.3)

## 2013-05-24 LAB — LACTATE DEHYDROGENASE (CC13): LDH: 111 U/L — ABNORMAL LOW (ref 125–245)

## 2013-05-24 NOTE — Progress Notes (Signed)
[facility] OFFICE PROGRESS NOTE  Perrin Maltese, MD [place] [address]  DIAGNOSIS: MGUS (monoclonal gammopathy of unknown significance) - Plan: CBC with Differential, Comprehensive metabolic panel (Cmet) - [facility], IgG, IgA, IgM, Kappa/lambda light chains  Chief Complaint  Patient presents with  . Follow-up    CURRENT TREATMENT: Observation.   INTERVAL HISTORY: Erin Hunt 80 y.o. female with a history of MGUS is here for followup.  She reports that she possibly had a mini CVA with negative subsequent neurological work-up.  She did have neuropathy of the L leg.  She was recently started on both gabapentin and alpha lipoic acid with improvement.  She did not take the gabapentin because the pain got better.   She reports a history of low sodium that required a brief admission.  She reports losing 10 lbs since her last visit.  She up keeps a two acre yard with frequent yark work and activities.  She lives alone.  She actively driving.  She denies a recent history of falls.  She exercises regularly.    MEDICAL HISTORY: Past Medical History  Diagnosis Date  . Anemia   . IgG gammopathy   . Arthritis   . Diverticulosis   . GERD (gastroesophageal reflux disease)   . Allergic rhinitis   . IBS (irritable bowel syndrome)   . Fibromyalgia   . Carpal tunnel syndrome   . Cervical spine degeneration   . CAD (coronary artery disease)   . Hypertension   . Vitamin B12 deficiency   . LBBB (left bundle branch block)     INTERIM HISTORY: has B12 DEFICIENCY; HYPERLIPIDEMIA; ANXIETY, SITUATIONAL; CARPAL TUNNEL SYNDROME, BILATERAL; PERIPHERAL NEUROPATHY, FEET; Essential hypertension, benign; RHINITIS; GERD; DIVERTICULOSIS OF COLON; IRRITABLE BOWEL SYNDROME; Unspecified vaginitis and vulvovaginitis; OSTEOARTHRITIS, HANDS, BILATERAL; DEGENERATIVE DISC DISEASE, CERVICAL SPINE; FIBROMYALGIA; FACIAL PAIN; COUGH, CHRONIC; FREQUENCY, URINARY; ABDOMINAL PAIN; POSTMENOPAUSAL  STATUS; Lung nodules; MGUS (monoclonal gammopathy of unknown significance); Aortic stenosis; CAD (coronary artery disease), native coronary artery; TIA (transient ischemic attack); and Chest pain, atypical on her problem list.    ALLERGIES:  is allergic to sulfonamide derivatives.  MEDICATIONS: has a current medication list which includes the following prescription(s): alpha-lipoic acid, amlodipine, aspirin, atorvastatin, benzonatate, biotin, celebrex, vitamin d3, cyclobenzaprine, epipen 2-pak, esomeprazole, estropipate, fexofenadine, furosemide, gabapentin, hyoscyamine, ipratropium, levocetirizine, magnesium gluconate, montelukast, nitroglycerin, nystatin cream, omega-3 fatty acids, valsartan, and vitamin e.  SURGICAL HISTORY:  Past Surgical History  Procedure Laterality Date  . Tsa    . Vesicovaginal fistula closure w/ tah    . Cardiac catheterization    . Cholecystectomy     PROBLEM LIST:  1. IgG kappa monoclonal gammopathy of uncertain significance detected in January 2008. Quantitative immunoglobulins have been normal. Urine immunofixation electrophoresis was negative. A 24-hour urine protein was normal. The beta-2 microglobulin was minimally increased at initial evaluation. The patient has not had a bone marrow or metastatic bone survey. Our clinical impression at this time is that the patient's MGUS is most likely benign.  2. Fibromyalgia.  3. Hypertension.  4. GERD.  5. Dyslipidemia.  6. Diverticulosis of the colon.  7. Hearing impairment, requiring hearing aids.  8. Coronary artery disease.  9. Irritable bowel syndrome.  10.Cervical spine disease.  11.Systolic ejection murmur.  12. Pulmonary nodules noted on CT scan of the chest on 11/26/2009. Repeat CT scan of the chest carried out on 12/26/2011 shows  stability of the bilateral pulmonary nodules felt to be most likely  benign and unchanged from prior  studies.  13.Status post surgery on the right hand for carpal tunnel syndrome  in  early 2012.  REVIEW OF SYSTEMS:   Constitutional: Denies fevers, chills or abnormal weight loss Eyes: Denies blurriness of vision Ears, nose, mouth, throat, and face: Denies mucositis or sore throat Respiratory: Denies cough, dyspnea or wheezes Cardiovascular: Denies palpitation, chest discomfort or lower extremity swelling Gastrointestinal:  Denies nausea, heartburn or occasional bloating and off/on constipation and diarrhea Skin: Denies abnormal skin rashes Lymphatics: Denies new lymphadenopathy or easy bruising Neurological:Denies numbness, tingling or new weaknesses Behavioral/Psych: Mood is stable, no new changes  All other systems were reviewed with the patient and are negative.  PHYSICAL EXAMINATION: ECOG PERFORMANCE STATUS: 0 - Asymptomatic  Blood pressure 150/61, pulse 94, temperature 98.1 F (36.7 C), temperature source Oral, resp. rate 18, height 5\' 3"  (1.6 m), weight 140 lb 6.4 oz (63.685 kg), SpO2 98.00%.  GENERAL:alert, no distress and comfortable; well developed and well nourished. SKIN: skin color, texture, turgor are normal, no rashes or significant lesions EYES: normal, Conjunctiva are pink and non-injected, sclera clear OROPHARYNX:no exudate, no erythema and lips, buccal mucosa, and tongue normal  NECK: supple, thyroid normal size, non-tender, without nodularity LYMPH:  no palpable lymphadenopathy in the cervical, axillary or supraclavicular LUNGS: clear to auscultation with normal breathing effort, no wheezes or rhonchi HEART: regular rate & rhythm and no murmurs and no lower extremity edema ABDOMEN:abdomen soft, non-tender and normal bowel sounds Musculoskeletal:no cyanosis of digits and no clubbing  NEURO: alert & oriented x 3 with fluent speech, no focal motor/sensory deficits  Labs:  Lab Results  Component Value Date   WBC 6.4 05/24/2013   HGB 13.0 05/24/2013   HCT 40.1 05/24/2013   MCV 85.0 05/24/2013   PLT 310 05/24/2013   NEUTROABS 4.2 05/24/2013       Chemistry      Component Value Date/Time   NA 137 05/24/2013 1459   NA 135* 04/09/2013 1235   K 4.7 05/24/2013 1459   K 5.0 04/09/2013 1235   CL 97 04/09/2013 1235   CL 106 04/03/2012 1527   CO2 25 05/24/2013 1459   CO2 25 04/09/2013 1235   BUN 27.0* 05/24/2013 1459   BUN 22 04/09/2013 1235   CREATININE 1.1 05/24/2013 1459   CREATININE 0.97 04/09/2013 1235   CREATININE (Revised) 1.13 04/21/2006 1558      Component Value Date/Time   CALCIUM 9.4 05/24/2013 1459   CALCIUM 9.5 04/09/2013 1235   ALKPHOS 66 05/24/2013 1459   ALKPHOS 66 03/25/2013 1425   AST 16 05/24/2013 1459   AST 18 03/25/2013 1425   ALT 17 05/24/2013 1459   ALT 17 03/25/2013 1425   BILITOT 0.36 05/24/2013 1459   BILITOT 0.2* 03/25/2013 1425       Basic Metabolic Panel:  Recent Labs Lab 05/24/13 1459  NA 137  K 4.7  CO2 25  GLUCOSE 111  BUN 27.0*  CREATININE 1.1  CALCIUM 9.4   GFR Estimated Creatinine Clearance: 35.4 ml/min (by C-G formula based on Cr of 1.1). Liver Function Tests:  Recent Labs Lab 05/24/13 1459  AST 16  ALT 17  ALKPHOS 66  BILITOT 0.36  PROT 6.8  ALBUMIN 3.6   CBC:  Recent Labs Lab 05/24/13 1459  WBC 6.4  NEUTROABS 4.2  HGB 13.0  HCT 40.1  MCV 85.0  PLT 310   Studies:  No results found.   RADIOGRAPHIC STUDIES: No results found.  ASSESSMENT: Erin Hunt 80 y.o. female with a history  of MGUS (monoclonal gammopathy of unknown significance) - Plan: CBC with Differential, Comprehensive metabolic panel (Cmet) - [facility], IgG, IgA, IgM, Kappa/lambda light chains   PLAN:   1. IgG kappa monoclonal gammopathy of uncertain significance detected in January 2008. --We will continue to observe.  She is clinically doing well.  Her labs are within normal limits.   2. Pulmonary nodules noted on CT scan of the chest on 11/26/2009.  --Repeat CT scan of the chest carried out on 12/26/2011 shows stability of the bilateral pulmonary nodules felt to be most likely benign and unchanged from prior studies.   3.  Follow up. --Patient can follow up in 1 year with repeat labs and SPEP plus kappa lambda light chains.    All questions were answered. The patient knows to call the clinic with any problems, questions or concerns. We can certainly see the patient much sooner if necessary.  I spent 10 minutes counseling the patient face to face. The total time spent in the appointment was 15 minutes.    Concha Norway, MD 05/25/2013 5:37 PM

## 2013-05-25 ENCOUNTER — Telehealth: Payer: Self-pay | Admitting: Internal Medicine

## 2013-05-25 NOTE — Telephone Encounter (Signed)
S/w the pt's wife and she is aware of the may 2016 appts.

## 2013-05-28 LAB — KAPPA/LAMBDA LIGHT CHAINS
KAPPA LAMBDA RATIO: 0.75 (ref 0.26–1.65)
Kappa free light chain: 1.26 mg/dL (ref 0.33–1.94)
Lambda Free Lght Chn: 1.68 mg/dL (ref 0.57–2.63)

## 2013-05-28 LAB — SPEP & IFE WITH QIG
ALBUMIN ELP: 57.9 % (ref 55.8–66.1)
Alpha-1-Globulin: 3.7 % (ref 2.9–4.9)
Alpha-2-Globulin: 11.8 % (ref 7.1–11.8)
Beta 2: 5.6 % (ref 3.2–6.5)
Beta Globulin: 6.8 % (ref 4.7–7.2)
Gamma Globulin: 14.2 % (ref 11.1–18.8)
IGM, SERUM: 146 mg/dL (ref 52–322)
IgA: 256 mg/dL (ref 69–380)
IgG (Immunoglobin G), Serum: 877 mg/dL (ref 690–1700)
M-Spike, %: 0.27 g/dL
Total Protein, Serum Electrophoresis: 6.5 g/dL (ref 6.0–8.3)

## 2013-07-03 ENCOUNTER — Other Ambulatory Visit: Payer: Self-pay | Admitting: *Deleted

## 2013-07-03 MED ORDER — ATORVASTATIN CALCIUM 20 MG PO TABS: 20.0000 mg | ORAL_TABLET | Freq: Every day | ORAL | Status: DC

## 2013-07-12 ENCOUNTER — Encounter: Payer: Self-pay | Admitting: Interventional Cardiology

## 2013-07-14 ENCOUNTER — Other Ambulatory Visit: Payer: Self-pay | Admitting: *Deleted

## 2013-07-14 DIAGNOSIS — E782 Mixed hyperlipidemia: Secondary | ICD-10-CM

## 2013-07-14 DIAGNOSIS — Z79899 Other long term (current) drug therapy: Secondary | ICD-10-CM

## 2013-08-05 ENCOUNTER — Other Ambulatory Visit (INDEPENDENT_AMBULATORY_CARE_PROVIDER_SITE_OTHER): Payer: Medicare Other

## 2013-08-05 DIAGNOSIS — Z79899 Other long term (current) drug therapy: Secondary | ICD-10-CM

## 2013-08-05 DIAGNOSIS — E782 Mixed hyperlipidemia: Secondary | ICD-10-CM

## 2013-08-05 LAB — COMPREHENSIVE METABOLIC PANEL
ALBUMIN: 3.7 g/dL (ref 3.5–5.2)
ALK PHOS: 57 U/L (ref 39–117)
ALT: 15 U/L (ref 0–35)
AST: 20 U/L (ref 0–37)
BUN: 24 mg/dL — AB (ref 6–23)
CHLORIDE: 107 meq/L (ref 96–112)
CO2: 29 mEq/L (ref 19–32)
Calcium: 8.8 mg/dL (ref 8.4–10.5)
Creatinine, Ser: 1 mg/dL (ref 0.4–1.2)
GFR: 55.02 mL/min — ABNORMAL LOW (ref >60.00)
Glucose, Bld: 89 mg/dL (ref 70–99)
POTASSIUM: 4.6 meq/L (ref 3.5–5.1)
SODIUM: 140 meq/L (ref 135–145)
Total Bilirubin: 0.6 mg/dL (ref 0.2–1.2)
Total Protein: 6.7 g/dL (ref 6.0–8.3)

## 2013-08-05 LAB — LIPID PANEL
CHOL/HDL RATIO: 5
Cholesterol: 246 mg/dL — ABNORMAL HIGH (ref 0–200)
HDL: 51.7 mg/dL (ref >39.00)
LDL Cholesterol: 158 mg/dL — ABNORMAL HIGH (ref 0–99)
NonHDL: 194.3
TRIGLYCERIDES: 180 mg/dL — AB (ref 0.0–149.0)
VLDL: 36 mg/dL (ref 0.0–40.0)

## 2013-08-12 ENCOUNTER — Telehealth: Payer: Self-pay | Admitting: Interventional Cardiology

## 2013-08-12 NOTE — Telephone Encounter (Signed)
Pt is aware of lab results. She is aware that her cholesterol has almost double from the last time. Pt states that her kidney MD took her off Lipitor 20 mg, because she was having a low sodium blood level and leg cramps. Pt  has been off Lipitor for 3 to 4 weeks and  she still having some led cramps.

## 2013-08-12 NOTE — Telephone Encounter (Signed)
New message ° ° ° ° ° ° ° ° ° °Pt calling about lab results °

## 2013-08-13 NOTE — Telephone Encounter (Signed)
Okay. We will address later after they get a final recommendation about whether statin involved.

## 2013-08-15 MED ORDER — NITROGLYCERIN 0.4 MG SL SUBL: 0.4000 mg | SUBLINGUAL_TABLET | SUBLINGUAL | Status: DC | PRN

## 2013-08-15 NOTE — Telephone Encounter (Signed)
returned pt  call pt sts that she has  had mild chest discomfort she describes it as an burning sensation. pt has not needed to take nitro..pt sts that the chest discomfort does not radiate. pt sts that she is experiencing exertional dyspnea.pt sts that she has had an headache on and off and blurry vision. Pt denies any numbness .Pt has a hx of TIA.adv pt to f/u with her neurologist.pt concerned about chest discomfort and would like to be seen. appt sch with Dr.Smith for 7/28 @9 :15. Refill for nitro sent to pt pharmacy.pt given instructions on how and when to use nitro.pt has resumed taking lipitor she was concerned about her elevated lipids.pt adv to got to  ED if symptoms worsen. Pt agreeable with plan and verbalized understanding.

## 2013-08-15 NOTE — Telephone Encounter (Signed)
New Message  Pt called states that she has been experiencing Dizziness, burpining and chest pain On And off.

## 2013-08-20 ENCOUNTER — Ambulatory Visit (INDEPENDENT_AMBULATORY_CARE_PROVIDER_SITE_OTHER): Payer: Medicare Other | Admitting: Interventional Cardiology

## 2013-08-20 ENCOUNTER — Encounter: Payer: Self-pay | Admitting: Interventional Cardiology

## 2013-08-20 VITALS — BP 128/62 | HR 84 | Ht 63.0 in | Wt 141.0 lb

## 2013-08-20 DIAGNOSIS — Z789 Other specified health status: Secondary | ICD-10-CM

## 2013-08-20 DIAGNOSIS — I350 Nonrheumatic aortic (valve) stenosis: Secondary | ICD-10-CM

## 2013-08-20 DIAGNOSIS — G459 Transient cerebral ischemic attack, unspecified: Secondary | ICD-10-CM

## 2013-08-20 DIAGNOSIS — I10 Essential (primary) hypertension: Secondary | ICD-10-CM

## 2013-08-20 DIAGNOSIS — Z888 Allergy status to other drugs, medicaments and biological substances status: Secondary | ICD-10-CM

## 2013-08-20 DIAGNOSIS — I2510 Atherosclerotic heart disease of native coronary artery without angina pectoris: Secondary | ICD-10-CM

## 2013-08-20 DIAGNOSIS — I359 Nonrheumatic aortic valve disorder, unspecified: Secondary | ICD-10-CM

## 2013-08-20 MED ORDER — ASPIRIN 81 MG PO TBEC: 81.0000 mg | DELAYED_RELEASE_TABLET | ORAL | Status: DC

## 2013-08-20 NOTE — Progress Notes (Signed)
Patient ID: Erin Hunt, female   DOB: 1928/10/19, 80 y.o.   MRN: [id number]    [address], [address] [place], [place]  [address] Phone: [phone] Fax:  [phone]  Date:  08/20/2013   ID:  Erin Hunt, DOB [birth year], MRN [id number]  PCP:  Perrin Maltese, MD   ASSESSMENT:  1.  Musculoskeletal discomfort and weakness after resuming atorvastatin. The patient seems to be statin in  2. Chest discomfort, with features consistent with reflux 3. Blood pressure and excellent control 4. Excessive bruising on aspirin daily  PLAN:  1. Discontinue atorvastatin for one month. Thereafter, we will consider once or twice weekly statin therapy. 2. If she is unable to tolerate low-dose statin therapy, she may end up needing a PCS K9   SUBJECTIVE: Erin Hunt is a 80 y.o. female who is here in followup. Her nephrologist discontinued statin therapy over a month ago. She then came to our office and had limited levels performed and they were crazy elevated. We instructed her to get back on her lipid therapy and since that time she has had migratory musculoskeletal complaints, difficulty with her vision, and generally felt terrible. A lot of these complaints are similar way she was having prior to having atorvastatin discontinued by her nephrologist. She went to the emergency room and had cardiac markers and EKGs performed all unremarkable. She is here today for followup and to discuss management going forward.   Wt Readings from Last 3 Encounters:  08/20/13 141 lb (63.957 kg)  05/24/13 140 lb 6.4 oz (63.685 kg)  04/19/13 140 lb (63.504 kg)     Past Medical History  Diagnosis Date  . Anemia   . IgG gammopathy   . Arthritis   . Diverticulosis   . GERD (gastroesophageal reflux disease)   . Allergic rhinitis   . IBS (irritable bowel syndrome)   . Fibromyalgia   . Carpal tunnel syndrome   . Cervical spine degeneration   . CAD (coronary artery disease)   . Hypertension   . Vitamin  B12 deficiency   . LBBB (left bundle branch block)     Current Outpatient Prescriptions  Medication Sig Dispense Refill  . ALPHA LIPOIC ACID PO Take 1 tablet by mouth daily.      [name] amLODipine (NORVASC) 5 MG tablet Take 1 tablet (5 mg total) by mouth daily.  90 tablet  3  . aspirin EC 81 MG EC tablet Take 1 tablet (81 mg total) by mouth daily.  90 tablet  0  . atorvastatin (LIPITOR) 20 MG tablet Take 1 tablet (20 mg total) by mouth daily.  90 tablet  1  . benzonatate (TESSALON) 100 MG capsule Take 1 capsule by mouth daily as needed.      . Biotin 10 MG TABS Take 1 tablet by mouth daily.       . celecoxib (CELEBREX) 200 MG capsule       . Cholecalciferol (VITAMIN D3) 1000 UNITS CAPS Take 1 tablet by mouth daily.       . cyclobenzaprine (FLEXERIL) 10 MG tablet       . EPINEPHrine (EPIPEN 2-PAK) 0.3 mg/0.3 mL IJ SOAJ injection       . esomeprazole (NEXIUM) 40 MG capsule Take 40 mg by mouth daily.       [name] estropipate (OGEN) 1.5 MG tablet Take 1.5 mg by mouth daily.      . fexofenadine (ALLEGRA) 180 MG tablet Take 180 mg by mouth daily.      [name]  hyoscyamine (LEVSIN SL) 0.125 MG SL tablet       . ipratropium (ATROVENT) 0.06 % nasal spray Place into the nose 2 (two) times daily as needed.      . magnesium gluconate (MAGONATE) 500 MG tablet Take 500 mg by mouth daily.       . nitroGLYCERIN (NITROSTAT) 0.4 MG SL tablet Place 1 tablet (0.4 mg total) under the tongue every 5 (five) minutes as needed for chest pain.  25 tablet  2  . nystatin cream (MYCOSTATIN) as needed.      . Omega-3 Fatty Acids (FISH OIL PO) Take 1 capsule by mouth 2 (two) times daily.      . valsartan (DIOVAN) 160 MG tablet Take 1 tablet (160 mg total) by mouth daily.  5 tablet  0  . vitamin E 100 UNIT capsule Take 300 Units by mouth daily.       No current facility-administered medications for this visit.    Allergies:    Allergies  Allergen Reactions  . Aspirin Other (See Comments)    Ecchymosis  . Codeine Sulfate Nausea  Only  . Erythromycin Nausea And Vomiting  . Sulfa Antibiotics Nausea Only  . Sulfonamide Derivatives Nausea Only    Social History:  The patient  reports that she has never smoked. She does not have any smokeless tobacco history on file. She reports that she does not drink alcohol.   ROS:  Please see the history of present illness.   No focal deficits. She has not had syncope or palpitations. No prolonged chest pain time with angina.   All other systems reviewed and negative.   OBJECTIVE: VS:  BP 128/62  Pulse 84  Ht 5\' 3"  (1.6 m)  Wt 141 lb (63.957 kg)  BMI 24.98 kg/m2 Well nourished, well developed, in no acute distress, appears than stated age flap flat HEENT: normal Neck: JVD flat. Carotid bruit absent  Cardiac:  normal S1, S2; RRR; 2/6 crescendo decrescendo right upper sternal border and left lower sternal border systolic murmur. S4 gallop is also present Lungs:  clear to auscultation bilaterally, no wheezing, rhonchi or rales Abd: soft, nontender, no hepatomegaly Ext: Edema absent. Pulses 2+ and symmetric Skin: warm and dry Neuro:  CNs 2-12 intact, no focal abnormalities noted  EKG:  Left axis deviation with normal sinus rhythm left atrial Malick Paul wave progression. Unchanged from prior trace       Signed, Illene Labrador III, MD 08/20/2013 10:12 AM

## 2013-08-20 NOTE — Patient Instructions (Signed)
Your physician has recommended you make the following change in your medication:  1) REDUCE Asprin 81 mg to 3 times weekly (Mon, Wed, Fri) 2) HOLD Atorvastatin for 1 month. Call the office in 1 month with an update of symptoms.[phone]   Take all other medications as prescribed  Follow up as planned

## 2013-09-11 ENCOUNTER — Telehealth: Payer: Self-pay | Admitting: Interventional Cardiology

## 2013-09-11 NOTE — Telephone Encounter (Signed)
New problem    Pt calling to give an update on her cholesterol and want to speak to nurse. Please call pt.

## 2013-09-11 NOTE — Telephone Encounter (Signed)
Pt calling Dr Tamala Julian as instructed at last OV, to inform him that she needs to make a lab appt to recheck her lipids.  Pt states at her last OV with Dr Tamala Julian, her lipids were elevated due to discontinuation of statin med, because of the muscle aches it caused. Pt states that Dr Tamala Julian told her to call back in one month to make a lab appt to recheck this.  Informed pt that Dr Tamala Julian is out of the office this week, but will return next week.  Informed pt that I will route this message to Dr Tamala Julian and Lattie Haw his CMA to further review and advise, and we will follow-up with her thereafter with new orders and appt date.  Pt verbalized understanding and agrees with this plan.

## 2013-09-13 NOTE — Telephone Encounter (Signed)
called to get update on pt symptoms.pt sts that since holding her statin for 1 months he feels alot better. pt sts that sshe has not had any miscle pain/aches adv her I will fwd an update to Dr.Smith and call back with his recommendation when he returns to the office next wk. Pt agreeable and verbalized understanding.

## 2013-09-13 NOTE — Telephone Encounter (Signed)
Stay off statin. Start Zetia 10 mgdaily

## 2013-09-17 NOTE — Telephone Encounter (Signed)
called to give pt Dr.Smith recommendation.lmtcb 

## 2013-09-20 MED ORDER — EZETIMIBE 10 MG PO TABS: 10.0000 mg | ORAL_TABLET | Freq: Every day | ORAL | Status: DC

## 2013-09-20 NOTE — Telephone Encounter (Signed)
Follow Up    Pt is returning call from a few days ago. Please call.

## 2013-09-20 NOTE — Telephone Encounter (Signed)
Pt returning Lisa's call.  Advised that Dr. Tamala Julian wants her to stay off of the statin and will start Zetia 10 mg.  Will send to [facility].

## 2013-10-01 ENCOUNTER — Telehealth: Payer: Self-pay | Admitting: Interventional Cardiology

## 2013-10-01 NOTE — Telephone Encounter (Signed)
returned pt call. pt sts that after 3 doses of Zetia she began having the same symptoms as whwn she was taking Atorvastatin muscle pain/headache/blurred vision.pt has stopped Zetia.ad her I will fwd Dr.Smith an update and call back with his recommendations. Pt agreeable and verbalized understanding.

## 2013-10-01 NOTE — Telephone Encounter (Signed)
New message      Pt is having same problems with zetia that she had with lipitor.  Please advise

## 2013-10-03 NOTE — Telephone Encounter (Signed)
It would be unusual that zetia is causing. Not other options for therapy now. Lets stay off and follow. Next time she has these muscle aches and HA please call.

## 2013-10-04 NOTE — Telephone Encounter (Signed)
Follow up     Talk to Dr Thompson Caul nurse regarding her zetia

## 2013-10-04 NOTE — Telephone Encounter (Signed)
pt given Dr.Smith response.It would be unusual that zetia is causing. No other options for therapy now. Lets stay off and follow. Next time she has these muscle aches and HA please call. pt is concerded about her elevated cholesterol numbers. pt sts that she had previously discussed with Dr.Smith  Restarting statin at just a couple times a week instead of daily. Pt would also like to know when should she have her lipids recheck. Adv pt I will fwd Dr.Smith a message and call back with his response. Pt agreeable and verbalized understanding.

## 2013-10-11 NOTE — Telephone Encounter (Signed)
Follow up ° ° ° ° °Returning a nurses call °

## 2013-10-14 NOTE — Telephone Encounter (Signed)
Pt states she has not had any muscle aches or headaches since stopping Zetia.

## 2013-10-14 NOTE — Telephone Encounter (Signed)
Pt confirming that she received information about not taking Zetia for now.    She is asking if Dr Tamala Julian recommends trying to take Zetia every other day or 3 times a week.  I will forward to Dr Tamala Julian for review.

## 2013-10-14 NOTE — Telephone Encounter (Signed)
Follow up    Patient calling back waiting on response from nurse / MD previous message.

## 2013-10-17 NOTE — Telephone Encounter (Signed)
Stay off cholesterol meds

## 2013-10-18 NOTE — Telephone Encounter (Signed)
Pt given Dr.Smith instructions to stay off cholesterol meds. pt is due to see Dr.Smith back in Dec 2015 and can discuss further.pt verbalized understanding.

## 2014-01-27 ENCOUNTER — Ambulatory Visit (INDEPENDENT_AMBULATORY_CARE_PROVIDER_SITE_OTHER): Payer: Medicare Other | Admitting: Interventional Cardiology

## 2014-01-27 ENCOUNTER — Encounter: Payer: Self-pay | Admitting: Interventional Cardiology

## 2014-01-27 VITALS — BP 140/70 | HR 73 | Ht 63.0 in | Wt 142.0 lb

## 2014-01-27 DIAGNOSIS — G459 Transient cerebral ischemic attack, unspecified: Secondary | ICD-10-CM

## 2014-01-27 DIAGNOSIS — E785 Hyperlipidemia, unspecified: Secondary | ICD-10-CM

## 2014-01-27 DIAGNOSIS — I10 Essential (primary) hypertension: Secondary | ICD-10-CM

## 2014-01-27 DIAGNOSIS — I2510 Atherosclerotic heart disease of native coronary artery without angina pectoris: Secondary | ICD-10-CM

## 2014-01-27 NOTE — Progress Notes (Signed)
Patient ID: Erin Hunt, female   DOB: [birth year], 81 y.o.   MRN: [id number]    [address], [address] [place], [place]  [address] Phone: [phone] Fax:  [phone]  Date:  01/27/2014   ID:  Erin Hunt, DOB 05-31-1928, MRN [id number]  PCP:  Perrin Maltese, MD   ASSESSMENT:  1. Asymptomatic coronary artery disease 2. Hypertension 3. Hyperlipidemia, unable to treat due to a multitude of medication side effects and refusal of PSK 9 therapy  PLAN:  1. We had a long conversation concerning management of lipids which are significantly elevated. She has been unable to tolerate any statin and also had difficulty with Zetia. I recommended PSK 9 therapy. She refused and based upon the fact that it has not been on the market long and she worries thatwhe may cause a problem that we are not yet aware. 2. Aerobic activity 3. Weight control 4. Low-salt diet 5. Click follow-up 1 year 6. No need to monitor her lipid levels since we will have no action plan for therapy   SUBJECTIVE: Erin Hunt is a 81 y.o. female is doing well. She was hospitalized with leg dysesthesia and possible TIA. She also had some difficulty later in the year with hyponatremia. We have tried multiple statins agents but she has been unable to tolerate. She denies chest discomfort. She remains active. She basically has a negative filling about having a lipid therapy that is not based upon diet and activity.   Wt Readings from Last 3 Encounters:  01/27/14 142 lb (64.411 kg)  08/20/13 141 lb (63.957 kg)  05/24/13 140 lb 6.4 oz (63.685 kg)     Past Medical History  Diagnosis Date  . Anemia   . IgG gammopathy   . Arthritis   . Diverticulosis   . GERD (gastroesophageal reflux disease)   . Allergic rhinitis   . IBS (irritable bowel syndrome)   . Fibromyalgia   . Carpal tunnel syndrome   . Cervical spine degeneration   . CAD (coronary artery disease)   . Hypertension   . Vitamin B12 deficiency   . LBBB  (left bundle branch block)     Current Outpatient Prescriptions  Medication Sig Dispense Refill  . ALPHA LIPOIC ACID PO Take 1 tablet by mouth daily.    [name] amLODipine (NORVASC) 5 MG tablet Take 1 tablet (5 mg total) by mouth daily. 90 tablet 3  . aspirin 81 MG EC tablet Take 1 tablet (81 mg total) by mouth 3 (three) times a week. Mon,Wed,Fri    . BIOTIN PO Take 1,000 mg by mouth daily.    . celecoxib (CELEBREX) 200 MG capsule Take 200 mg by mouth as needed.     . Cholecalciferol (VITAMIN D3) 1000 UNITS CAPS Take 1 tablet by mouth daily.     . cyclobenzaprine (FLEXERIL) 10 MG tablet Take 10 mg by mouth as needed.     [name] EPINEPHrine (EPIPEN 2-PAK) 0.3 mg/0.3 mL IJ SOAJ injection Inject 0.3 mg as directed once.     [name] esomeprazole (NEXIUM) 40 MG capsule Take 40 mg by mouth daily.     [name] estropipate (OGEN) 1.5 MG tablet Take 1.5 mg by mouth daily.    . fexofenadine (ALLEGRA) 180 MG tablet Take 180 mg by mouth daily.    . hyoscyamine (LEVSIN SL) 0.125 MG SL tablet Take 0.125 mg by mouth every 6 (six) hours as needed.     [name] ipratropium (ATROVENT) 0.06 % nasal spray  Place into the nose 2 (two) times daily as needed.    [name] levocetirizine (XYZAL) 5 MG tablet Take 5 mg by mouth every evening.     . magnesium gluconate (MAGONATE) 500 MG tablet Take 500 mg by mouth daily.     . nitroGLYCERIN (NITROSTAT) 0.4 MG SL tablet Place 1 tablet (0.4 mg total) under the tongue every 5 (five) minutes as needed for chest pain. 25 tablet 2  . nystatin cream (MYCOSTATIN) Apply 1 application topically as needed.     [name] REGENECARE 2 % GEL Apply 1 application topically 2 (two) times daily. prn    . valsartan (DIOVAN) 160 MG tablet Take 1 tablet (160 mg total) by mouth daily. 5 tablet 0   No current facility-administered medications for this visit.    Allergies:    Allergies  Allergen Reactions  . Aspirin Other (See Comments)    Ecchymosis  . Codeine Sulfate Nausea Only  . Erythromycin Nausea And Vomiting  . Sulfa  Antibiotics Nausea Only  . Sulfonamide Derivatives Nausea Only    Social History:  The patient  reports that she has never smoked. She does not have any smokeless tobacco history on file. She reports that she does not drink alcohol.   ROS:  Please see the history of present illness.   No claudication. No recent neurological symptoms.   All other systems reviewed and negative.   OBJECTIVE: VS:  BP 140/70 mmHg  Pulse 73  Ht 5\' 3"  (1.6 m)  Wt 142 lb (64.411 kg)  BMI 25.16 kg/m2  SpO2 97% Well nourished, well developed, in no acute distress, appears stated age 56: normal Neck: JVD flat. Carotid bruit absent  Cardiac:  normal S1, S2; RRR; no murmur Lungs:  clear to auscultation bilaterally, no wheezing, rhonchi or rales Abd: soft, nontender, no hepatomegaly Ext: Edema absent. Pulses 2+ Skin: warm and dry Neuro:  CNs 2-12 intact, no focal abnormalities noted  EKG:  Not performed       Signed, Illene Labrador III, MD 01/27/2014 3:16 PM

## 2014-01-27 NOTE — Patient Instructions (Signed)
Your physician recommends that you continue on your current medications as directed. Please refer to the Current Medication list given to you today.  Your physician recommends that you schedule a follow-up appointment in: 1 year with Dr. Tamala Julian.

## 2014-02-03 ENCOUNTER — Other Ambulatory Visit: Payer: Self-pay | Admitting: Interventional Cardiology

## 2014-04-16 ENCOUNTER — Telehealth: Payer: Self-pay | Admitting: Internal Medicine

## 2014-04-16 NOTE — Telephone Encounter (Signed)
Spoke with patient and she is aware of her new appointment with dr Lewie Loron

## 2014-05-13 NOTE — H&P (Signed)
PATIENT NAME:  Erin Hunt, WELFORD MR#:  [id number] DATE OF BIRTH:  [birth year]  DATE OF ADMISSION:  12/09/2011  PRIMARY CARE PHYSICIAN:  Lamonte Sakai, MD  (This is a direct admission.)  CHIEF COMPLAINT: Low sodium.   HISTORY OF PRESENT ILLNESS: The patient is an 81 year old Caucasian female with a history of coronary artery disease, hypertension, hyperlipidemia, fibromyalgia, irritable bowel syndrome, was sent by primary care physician directly to the medical floor for low sodium. The patient is alert, awake, oriented, in no acute distress. The patient said she has had generalized weakness for the past three weeks. In addition, the patient has a left-sided numbness for about three days. She said she also has left facial numbness but denies any slurred speech, dysphagia. No headache or dizziness. No chest pain, palpitation, orthopnea, or nocturnal dyspnea. The patient said she drinks a lot of water. Her sodium in her primary care physician's office was 126. The patient denies any diarrhea, dysuria, but has urinary frequency.   PAST MEDICAL HISTORY:  1. Coronary artery disease.  2. Hypertension.  3. Irritable bowel syndrome  4. Hyperlipidemia. 5. Fibromyalgia.  FAMILY HISTORY: Hypertension. Heart attack in her brother.   SOCIAL HISTORY: No smoking or drinking or illicit drugs.   PAST SURGICAL HISTORY:  1. Right breast surgery.  2. Cataract surgery.  3. Removal of left ovary.  4. Hysterectomy.  5. Gallbladder removed.    MEDICATIONS:  1. Aricept 10 mg p.o. daily. 2. Vitamin B3, 1000 unit cap  once daily. 3. Magnesium 500 mg p.o. b.i.d.  4. Lipitor 20 mg p.o. daily at bedtime.  5. Hyoscyamine 0.125 mg 2 to 4 tablets p.o. daily.  6. Flexeril 5 mg p.o. at bedtime. 7. Calcium carbonate 500 mg p.o. b.i.d.  8. Aricept 10 mg p.o. daily.  9. Diovan/HCTZ 160/12.5 mg p.o. daily.  10. Amlodipine 5 mg p.o. daily.  11. Celebrex 200 mg p.o. daily.  12. Carbamazepine 200 mg p.o. daily.   REVIEW OF  SYSTEMS: CONSTITUTIONAL: The patient denies any fever or chills. No headache or dizziness but has generalized weakness and no weight loss. EYES: No double vision, blurred vision, but has a cataract. ENT: No postnasal drip, slurred speech, or dysphagia, but has left facial numbness. CARDIOVASCULAR: No chest pain, palpitation, orthopnea, or nocturnal dyspnea. No leg edema. PULMONARY: No cough, sputum, shortness of breath, or hemoptysis. GASTROINTESTINAL: No abdominal pain, nausea, vomiting, or diarrhea. No melena or bloody stool. SKIN: No rash or jaundice. HEMATOLOGY: No easy bruising, bleeding. GENITOURINARY:  No dysuria, hematuria, or incontinence. NEUROLOGY: No syncope, loss of consciousness or seizure.   PHYSICAL EXAMINATION:  VITAL SIGNS: Temperature 97.3, blood pressure 125/63, pulse 88. O2 saturation 96% on room air.   GENERAL: The patient is alert, awake, oriented, in no acute distress.   HEENT: Pupils are round, equal, reactive to light, accommodation. Moist oral mucosa. Clear oropharynx.   NECK: Supple. No JVD or carotid bruit. No lymphadenopathy. No thyromegaly.   CARDIOVASCULAR: S1, S2 regular rate, rhythm. No murmurs or gallops.   PULMONARY: Bilateral air entry. No wheezing or rales. No use of accessory muscles to breathe.  ABDOMEN: Soft. No distention or tenderness. No organomegaly. Bowel sounds present.   EXTREMITIES: No edema, clubbing, or cyanosis. No calf tenderness. Strong bilateral pedal pulses.   SKIN: No rash or jaundice.   NEUROLOGIC: Alert and oriented x3. No focal deficit. Power five out of five. Sensation intact. Deep tendon reflexes 2+.   LABORATORY, DIAGNOSTIC AND RADIOLOGICAL DATA: (From primary care physician's  office)  WBC 10.7, hemoglobin 13.7, platelets 378. Sodium 126, potassium 4.4, chloride 95, bicarbonate 30, glucose 101, BUN 32, creatinine 1.29, and bilirubin 0.4. Total protein 6.3, albumin 4.0, calcium 8.9. CT scan of head two days ago: No acute abnormality.  Ultrasound of her lower extremities:  No deep vein thrombosis.   IMPRESSION:  1. Hyponatremia.  2. Dehydration.  3. Hypertension, controlled.  4. Coronary artery disease.  5. Irritable bowel syndrome.  6. Fibromyalgia.  7. Hyperlipidemia.   PLAN OF TREATMENT:  1. The patient is already admitted to a Medical floor. We will start normal saline at 100 mL/h IV and follow up BMP and magnesium level, and also discontinue HCTZ which can cause hyponatremia.  2. We will continue Diovan and Norvasc for hypertension.  3. GI and deep vein thrombosis prophylaxis.  4. Continue other home medications.   I  discussed the patient's situation and plan of treatment with the patient and the patient's daughter.      TIME SPENT: About 58 minutes.   ____________________________ Demetrios Loll, MD qc:cbb D: 12/09/2011 16:26:03 ET T: 12/09/2011 18:18:46 ET JOB#: [id number]  cc: Demetrios Loll, MD, <Dictator> Perrin Maltese, MD Demetrios Loll MD ELECTRONICALLY SIGNED 12/11/2011 3:48

## 2014-05-18 NOTE — Op Note (Signed)
PATIENT NAME:  Erin Hunt, Erin Hunt MR#:  [id number] DATE OF BIRTH:  February 17, 1928  DATE OF PROCEDURE:  04/18/2011  PREOPERATIVE DIAGNOSIS:  Cataract, right eye.   POSTOPERATIVE DIAGNOSIS:  Cataract, right eye.  PROCEDURE PERFORMED:  Extracapsular cataract extraction using phacoemulsification with placement of an Alcon [id number], 19.0-diopter posterior chamber lens, serial [id number].   SURGEON:  Loura Back. Yarisbel Miranda, MD  ASSISTANT:  None.  ANESTHESIA:  4% lidocaine and 0.75% Marcaine in a 50/50 mixture with 10 units/mL of Hylenex added given as a peribulbar.  ANESTHESIOLOGIST:  Gunnar Bulla, MD   COMPLICATIONS:  None.  ESTIMATED BLOOD LOSS:  Less than 1 ml.  DESCRIPTION OF PROCEDURE:  The patient was brought to the operating room and given a peribulbar block.  The patient was then prepped and draped in the usual fashion.  The vertical rectus muscles were imbricated using 5-0 silk sutures.  These sutures were then clamped to the sterile drapes as bridle sutures.  A limbal peritomy was performed extending two clock hours and hemostasis was obtained with cautery.  A partial thickness scleral groove was made at the surgical limbus and dissected anteriorly in a lamellar dissection using an Alcon crescent knife.  The anterior chamber was entered superonasally with a Superblade and through the lamellar dissection with a 2.6 mm keratome.  DisCoVisc was used to replace the aqueous and a continuous tear capsulorrhexis was carried out.  Hydrodissection and hydrodelineation were carried out with balanced salt and a 27 gauge canula.  The nucleus was rotated to confirm the effectiveness of the hydrodissection.  Phacoemulsification was carried out using a divide-and-conquer technique.  Total ultrasound time was one minute and 2.3 seconds with an average power of 20.4 percent.  Irrigation/aspiration was used to remove the residual cortex.  DisCoVisc was used to inflate the capsule and the internal incision was  enlarged to 3 mm with the crescent knife.  The intraocular lens was folded and inserted into the capsular bag using the AcrySert Delivery System.   Irrigation/aspiration was used to remove the residual DisCoVisc.  Miostat was injected into the anterior chamber through the paracentesis track to inflate the anterior chamber and induce miosis.  The wound was checked for leaks and none were found. The conjunctiva was closed with cautery and the bridle sutures were removed.  Two drops of 0.3% Vigamox were placed on the eye.   An eye shield was placed on the eye.  The patient was discharged to the recovery room in good condition.  ____________________________ Loura Back Tal Neer, MD sad:cbb D: 04/18/2011 13:09:28 ET T: 04/18/2011 13:27:51 ET JOB#: [id number]  cc: Remo Lipps A. Kadelyn Dimascio, MD, <Dictator> Martie Lee MD ELECTRONICALLY SIGNED 04/25/2011 13:16

## 2014-05-18 NOTE — Op Note (Signed)
PATIENT NAME:  Erin Hunt, Erin Hunt MR#:  [id number] DATE OF BIRTH:  06-15-1928  DATE OF PROCEDURE:  05/25/2011  PREOPERATIVE DIAGNOSIS:  Cataract, left eye.    POSTOPERATIVE DIAGNOSIS:  Cataract, left eye.  PROCEDURE PERFORMED:  Extracapsular cataract extraction using phacoemulsification with placement of an Alcon [id number] 19.5-diopter posterior chamber lens, serial [id number].  SURGEON:  Loura Back. Selina Tapper, MD  ASSISTANT:  None.  ANESTHESIA:  4% lidocaine and 0.75% Marcaine in a 50/50 mixture with 10 units/mL of Hylenex added, given as a peribulbar.  ANESTHESIOLOGIST:  Elyse Hsu, MD  COMPLICATIONS:  None.  ESTIMATED BLOOD LOSS:  Less than 1 mL.  DESCRIPTION OF PROCEDURE:  The patient was brought to the operating room and given a peribulbar block.  The patient was then prepped and draped in the usual fashion.  The vertical rectus muscles were imbricated using 5-0 silk sutures.  These sutures were then clamped to the sterile drapes as bridle sutures.  A limbal peritomy was performed extending two clock hours and hemostasis was obtained with cautery.  A partial thickness scleral groove was made at the surgical limbus and dissected anteriorly in a lamellar dissection using an Alcon crescent knife.  The anterior chamber was entered supero-temporally with a Superblade and through the lamellar dissection with a 2.6 mm keratome.  DisCoVisc was used to replace the aqueous and a continuous tear capsulorrhexis was carried out.  Hydrodissection and hydrodelineation were carried out with balanced salt and a 27 gauge canula.  The nucleus was rotated to confirm the effectiveness of the hydrodissection.  Phacoemulsification was carried out using a divide-and-conquer technique.  Total ultrasound time was 1 minute and 27 seconds with an average power of  19.9 percent.  CDE was 29.17.  Irrigation/aspiration was used to remove the residual cortex.  DisCoVisc was used to inflate the capsule and the internal  incision was enlarged to 3 mm with the crescent knife.  The intraocular lens was folded and inserted into the capsular bag using the AcrySert Delivery System.   Irrigation/aspiration was used to remove the residual DisCoVisc.  Miostat was injected into the anterior chamber through the paracentesis track to inflate the anterior chamber and induce miosis.  The wound was checked for leaks and none were found. The conjunctiva was closed with cautery and the bridle sutures were removed.  Two drops of 0.3% Vigamox were placed on the eye.   An eye shield was placed on the eye.  The patient was discharged to the recovery room in good condition.  ____________________________ Loura Back Dagen Beevers, MD sad:cbb D: 05/25/2011 11:54:53 ET T: 05/25/2011 12:23:36 ET JOB#: [id number]  cc: Remo Lipps A. Latravion Graves, MD, <Dictator> Martie Lee MD ELECTRONICALLY SIGNED 05/30/2011 13:44

## 2014-05-26 ENCOUNTER — Ambulatory Visit: Payer: Medicare Other

## 2014-05-26 ENCOUNTER — Other Ambulatory Visit: Payer: Medicare Other

## 2014-05-29 ENCOUNTER — Other Ambulatory Visit: Payer: Self-pay

## 2014-06-04 ENCOUNTER — Other Ambulatory Visit: Payer: Self-pay | Admitting: *Deleted

## 2014-06-04 DIAGNOSIS — D472 Monoclonal gammopathy: Secondary | ICD-10-CM

## 2014-06-05 ENCOUNTER — Encounter: Payer: Self-pay | Admitting: Internal Medicine

## 2014-06-05 ENCOUNTER — Other Ambulatory Visit (HOSPITAL_BASED_OUTPATIENT_CLINIC_OR_DEPARTMENT_OTHER): Payer: Medicare Other

## 2014-06-05 ENCOUNTER — Ambulatory Visit (HOSPITAL_BASED_OUTPATIENT_CLINIC_OR_DEPARTMENT_OTHER): Payer: Medicare Other | Admitting: Internal Medicine

## 2014-06-05 VITALS — BP 141/57 | HR 76 | Temp 97.9°F | Resp 18 | Ht 63.0 in | Wt 140.1 lb

## 2014-06-05 DIAGNOSIS — D472 Monoclonal gammopathy: Secondary | ICD-10-CM | POA: Diagnosis present

## 2014-06-05 LAB — CBC WITH DIFFERENTIAL/PLATELET
BASO%: 1.2 % (ref 0.0–2.0)
BASOS ABS: 0.1 10*3/uL (ref 0.0–0.1)
EOS ABS: 0.2 10*3/uL (ref 0.0–0.5)
EOS%: 2.6 % (ref 0.0–7.0)
HCT: 40.9 % (ref 34.8–46.6)
HEMOGLOBIN: 13.7 g/dL (ref 11.6–15.9)
LYMPH%: 23.3 % (ref 14.0–49.7)
MCH: 28.3 pg (ref 25.1–34.0)
MCHC: 33.5 g/dL (ref 31.5–36.0)
MCV: 84.5 fL (ref 79.5–101.0)
MONO#: 0.7 10*3/uL (ref 0.1–0.9)
MONO%: 9.2 % (ref 0.0–14.0)
NEUT#: 4.5 10*3/uL (ref 1.5–6.5)
NEUT%: 63.7 % (ref 38.4–76.8)
PLATELETS: 323 10*3/uL (ref 145–400)
RBC: 4.84 10*6/uL (ref 3.70–5.45)
RDW: 14.4 % (ref 11.2–14.5)
WBC: 7.1 10*3/uL (ref 3.9–10.3)
lymph#: 1.6 10*3/uL (ref 0.9–3.3)

## 2014-06-05 LAB — COMPREHENSIVE METABOLIC PANEL (CC13)
ALT: 19 U/L (ref 0–55)
ANION GAP: 6 meq/L (ref 3–11)
AST: 20 U/L (ref 5–34)
Albumin: 3.6 g/dL (ref 3.5–5.0)
Alkaline Phosphatase: 73 U/L (ref 40–150)
BUN: 23.1 mg/dL (ref 7.0–26.0)
CALCIUM: 8.9 mg/dL (ref 8.4–10.4)
CHLORIDE: 101 meq/L (ref 98–109)
CO2: 26 mEq/L (ref 22–29)
CREATININE: 1 mg/dL (ref 0.6–1.1)
EGFR: 55 mL/min/{1.73_m2} — ABNORMAL LOW (ref >90)
Glucose: 95 mg/dl (ref 70–140)
Potassium: 5.1 mEq/L (ref 3.5–5.1)
Sodium: 133 mEq/L — ABNORMAL LOW (ref 136–145)
Total Bilirubin: 0.39 mg/dL (ref 0.20–1.20)
Total Protein: 7 g/dL (ref 6.4–8.3)

## 2014-06-05 LAB — LACTATE DEHYDROGENASE (CC13): LDH: 103 U/L — AB (ref 125–245)

## 2014-06-05 NOTE — Progress Notes (Signed)
[facility] [phone]   Fax:([phone]) [place], [place] [phone]   DIAGNOSIS: Monoclonal gammopathy of undetermined significance.  PRIOR THERAPY: None  CURRENT THERAPY: Observation.  INTERVAL HISTORY: Erin Hunt 81 y.o. female returns to the clinic today for follow-up visit who is here today to establish care with me. The patient is feeling fine today was no specific complaints. She was diagnosed with monoclonal gammopathy of undetermined significance several years ago and has been observation since that time. She was seen in the past by Dr. Ralene Ok and Dr. Juliann Mule. The patient is feeling fine today with no specific complaints. She denied having any significant chest pain, shortness breath, cough or hemoptysis. She denied having any weight loss or night sweats. She has no nausea or vomiting, no fever or chills. She had repeat myeloma panel performed earlier today and she is here for evaluation and discussion of her lab results.  MEDICAL HISTORY: Past Medical History  Diagnosis Date  . Anemia   . IgG gammopathy   . Arthritis   . Diverticulosis   . GERD (gastroesophageal reflux disease)   . Allergic rhinitis   . IBS (irritable bowel syndrome)   . Fibromyalgia   . Carpal tunnel syndrome   . Cervical spine degeneration   . CAD (coronary artery disease)   . Hypertension   . Vitamin B12 deficiency   . LBBB (left bundle branch block)     ALLERGIES:  is allergic to aspirin; codeine sulfate; erythromycin; sulfa antibiotics; and sulfonamide derivatives.  MEDICATIONS:  Current Outpatient Prescriptions  Medication Sig Dispense Refill  . ALPHA LIPOIC ACID PO Take 1 tablet by mouth daily.    [name] amLODipine (NORVASC) 5 MG tablet TAKE 1 TABLET DAILY 90 tablet 3  . aspirin 81 MG EC tablet Take 1 tablet (81 mg total) by mouth 3 (three) times a week. Mon,Wed,Fri    . BIOTIN PO Take 1,000 mg by mouth  daily.    . celecoxib (CELEBREX) 200 MG capsule Take 200 mg by mouth as needed.     . Cholecalciferol (VITAMIN D3) 1000 UNITS CAPS Take 1 tablet by mouth daily.     [name] EPINEPHrine (EPIPEN 2-PAK) 0.3 mg/0.3 mL IJ SOAJ injection Inject 0.3 mg as directed once.     [name] esomeprazole (NEXIUM) 40 MG capsule Take 40 mg by mouth daily.     [name] estropipate (OGEN) 1.5 MG tablet Take 1.5 mg by mouth daily.    . hyoscyamine (LEVSIN SL) 0.125 MG SL tablet Take 0.125 mg by mouth every 6 (six) hours as needed.     [name] ipratropium (ATROVENT) 0.06 % nasal spray Place into the nose 2 (two) times daily as needed.    [name] levocetirizine (XYZAL) 5 MG tablet     . magnesium gluconate (MAGONATE) 500 MG tablet Take 500 mg by mouth daily.     . montelukast (SINGULAIR) 10 MG tablet     . nitroGLYCERIN (NITROSTAT) 0.4 MG SL tablet Place 1 tablet (0.4 mg total) under the tongue every 5 (five) minutes as needed for chest pain. 25 tablet 2  . nystatin cream (MYCOSTATIN) Apply 1 application topically as needed.     [name] REGENECARE 2 % GEL Apply 1 application topically 2 (two) times daily. prn    . valsartan (DIOVAN) 160 MG tablet Take 1 tablet (160 mg total) by mouth daily. 5 tablet 0  . cyclobenzaprine (FLEXERIL) 10 MG  tablet Take 10 mg by mouth as needed.      No current facility-administered medications for this visit.    SURGICAL HISTORY:  Past Surgical History  Procedure Laterality Date  . Tsa    . Vesicovaginal fistula closure w/ tah    . Cardiac catheterization    . Cholecystectomy      REVIEW OF SYSTEMS:  Constitutional: negative Eyes: negative Ears, nose, mouth, throat, and face: negative Respiratory: negative Cardiovascular: negative Gastrointestinal: negative Genitourinary:negative Integument/breast: negative Hematologic/lymphatic: negative Musculoskeletal:negative Neurological: negative Behavioral/Psych: negative Endocrine: negative Allergic/Immunologic: negative   PHYSICAL EXAMINATION: General  appearance: alert, cooperative and no distress Head: Normocephalic, without obvious abnormality, atraumatic Neck: no adenopathy, no carotid bruit, no JVD, supple, symmetrical, trachea midline and thyroid not enlarged, symmetric, no tenderness/mass/nodules Lymph nodes: Cervical, supraclavicular, and axillary nodes normal. Resp: clear to auscultation bilaterally Back: symmetric, no curvature. ROM normal. No CVA tenderness. Cardio: regular rate and rhythm, S1, S2 normal, no murmur, click, rub or gallop GI: soft, non-tender; bowel sounds normal; no masses,  no organomegaly Extremities: extremities normal, atraumatic, no cyanosis or edema Neurologic: Alert and oriented X 3, normal strength and tone. Normal symmetric reflexes. Normal coordination and gait  ECOG PERFORMANCE STATUS: 0 - Asymptomatic  Blood pressure 141/57, pulse 76, temperature 97.9 F (36.6 C), temperature source Oral, resp. rate 18, height 5' 3"  (1.6 m), weight 140 lb 1.6 oz (63.549 kg), SpO2 98 %.  LABORATORY DATA: Lab Results  Component Value Date   WBC 7.1 06/05/2014   HGB 13.7 06/05/2014   HCT 40.9 06/05/2014   MCV 84.5 06/05/2014   PLT 323 06/05/2014      Chemistry      Component Value Date/Time   NA 133* 06/05/2014 0938   NA 140 08/05/2013 0935   NA 139 12/10/2011 0413   K 5.1 06/05/2014 0938   K 4.6 08/05/2013 0935   K 4.5 12/10/2011 0413   CL 107 08/05/2013 0935   CL 106 04/03/2012 1527   CL 107 12/10/2011 0413   CO2 26 06/05/2014 0938   CO2 29 08/05/2013 0935   CO2 29 12/10/2011 0413   BUN 23.1 06/05/2014 0938   BUN 24* 08/05/2013 0935   BUN 27* 12/10/2011 0413   CREATININE 1.0 06/05/2014 0938   CREATININE 1.0 08/05/2013 0935   CREATININE 1.03 12/10/2011 0413   CREATININE (Revised) 1.13 04/21/2006 1558      Component Value Date/Time   CALCIUM 8.9 06/05/2014 0938   CALCIUM 8.8 08/05/2013 0935   CALCIUM 8.2* 12/10/2011 0413   ALKPHOS 73 06/05/2014 0938   ALKPHOS 57 08/05/2013 0935   AST 20  06/05/2014 0938   AST 20 08/05/2013 0935   ALT 19 06/05/2014 0938   ALT 15 08/05/2013 0935   BILITOT 0.39 06/05/2014 0938   BILITOT 0.6 08/05/2013 0935       RADIOGRAPHIC STUDIES: No results found.  ASSESSMENT AND PLAN: This is a very pleasant 81 years old white female with history of monoclonal gammopathy of undetermined significance diagnosed several years ago. The patient has been observation for the last few years with no significant increase in her myeloma panel or concern for transition to multiple myeloma. I had a lengthy review of her previous records and her myeloma panel has always been within the normal range for the last few years. Her myeloma panel today is still pending I had a lengthy discussion with the patient about her condition. I explained to the patient that if the myeloma panel today showed no  evidence for progression. I may discharge her from the clinic with routine follow-up visit with her primary care physician and she will be seen at the [facility] in the future on as-needed basis. If there is any concerning abnormality, we will call the patient with the results and arrange a follow-up appointment for her. The patient agreed to the current plan. She was advised to call if she has any concerning symptoms. The patient voices understanding of current disease status and treatment options and is in agreement with the current care plan.  All questions were answered. The patient knows to call the clinic with any problems, questions or concerns. We can certainly see the patient much sooner if necessary.  I spent 15 minutes counseling the patient face to face. The total time spent in the appointment was 25 minutes.  Disclaimer: This note was dictated with voice recognition software. Similar sounding words can inadvertently be transcribed and may not be corrected upon review.

## 2014-06-06 LAB — KAPPA/LAMBDA LIGHT CHAINS
KAPPA FREE LGHT CHN: 2.33 mg/dL — AB (ref 0.33–1.94)
KAPPA LAMBDA RATIO: 1.27 (ref 0.26–1.65)
Lambda Free Lght Chn: 1.83 mg/dL (ref 0.57–2.63)

## 2014-06-06 LAB — IGG, IGA, IGM
IGG (IMMUNOGLOBIN G), SERUM: 885 mg/dL (ref 690–1700)
IgA: 250 mg/dL (ref 69–380)
IgM, Serum: 132 mg/dL (ref 52–322)

## 2014-06-10 ENCOUNTER — Other Ambulatory Visit: Payer: Self-pay | Admitting: Surgery

## 2014-06-10 DIAGNOSIS — N6092 Unspecified benign mammary dysplasia of left breast: Secondary | ICD-10-CM

## 2014-06-20 ENCOUNTER — Encounter (HOSPITAL_BASED_OUTPATIENT_CLINIC_OR_DEPARTMENT_OTHER): Payer: Self-pay | Admitting: *Deleted

## 2014-06-24 NOTE — H&P (Signed)
Erin Hunt 06/10/2014 1:47 PM Location: [facility] Patient #: [id number] DOB: 1928-09-12 Widowed / Language: Cleophus Molt / Race: White Female  History of Present Illness (Zehava Turski A. Ninfa Linden MD; 06/10/2014 2:01 PM) Patient words: L breast.  The patient is a 81 year old female who presents with a complaint of Breast problems. This is a pleasant female who is accompanied by her daughter. She is referred by Dr. Marcelo Baldy after the recent findings of atypical lobular hyperplasia on a biopsy of the left breast. She had had an abnormality found the left breast on recent screening mammography. Biopsy confirmed these findings. She has had no previous problems with her breasts. She denies nipple discharge. There is a family history of breast cancer. She is currently otherwise without any complaints. She tolerated the stereotactic biopsy very well.   Other Problems Erasmo Leventhal, RN, BSN; 06/10/2014 1:48 PM) Back Pain Chest pain Cholelithiasis Diverticulosis Gastric Ulcer Gastroesophageal Reflux Disease High blood pressure Hypercholesterolemia Oophorectomy Right. Thyroid Disease  Past Surgical History Multimedia programmer, RN, BSN; 06/10/2014 1:48 PM) Cataract Surgery Bilateral. Gallbladder Surgery - Laparoscopic Hysterectomy (not due to cancer) - Partial Tonsillectomy  Diagnostic Studies History Erasmo Leventhal, RN, BSN; 06/10/2014 1:48 PM) Pap Smear 1-5 years ago  Allergies Erasmo Leventhal, RN, BSN; 06/10/2014 1:51 PM) Aspirin Low Dose *ANALGESICS - NonNarcotic* Codeine Sulfate *ANALGESICS - OPIOID* Erythromycin *DERMATOLOGICALS* Sulfacetamide *CHEMICALS*  Medication History (Crystal Dollard, RN, BSN; 06/10/2014 1:56 PM) AmLODIPine Besylate (5MG  Tablet, Oral daily) Active. Cyclobenzaprine HCl (10MG  Tablet, Oral daily) Active. Fluticasone Propionate (50MCG/ACT Suspension, Nasal daily) Active. Estropipate (1.5MG  Tablet, Oral as needed)  Active. Nystatin (100000 UNIT/GM Cream, External daily) Active. Regenecare (2% Gel, External daily) Active. Prevnar 13 (Intramuscular daily) Active. Valsartan (160MG  Tablet, Oral daily) Active. Celecoxib (200MG  Capsule, Oral daily) Active. Hyoscyamine Sulfate (0.125MG  Tablet, Oral daily) Active. Levocetirizine Dihydrochloride (5MG  Tablet, Oral daily) Active. Nitrostat (0.4MG  Tab Sublingual, Sublingual as needed) Active. Vitamin D (400UNIT Capsule, 1 (one) Oral daily) Active. Biotin 5000 (5MG  Capsule, Oral daily) Active. Medications Reconciled  Social History (Erasmo Leventhal, RN, BSN; 06/10/2014 1:48 PM) Caffeine use Coffee. No alcohol use Tobacco use Never smoker.  Family History (Erasmo Leventhal, RN, BSN; 06/10/2014 1:48 PM) Arthritis Mother. Heart Disease Father.  Pregnancy / Birth History (Erasmo Leventhal, RN, BSN; 06/10/2014 1:48 PM) Age at menarche 73 years. Gravida 4 Maternal age 40-20 Para 4  Review of Systems Occupational hygienist, BSN; 06/10/2014 1:48 PM) General Present- Chills and Weight Loss. Not Present- Appetite Loss, Fatigue, Fever, Night Sweats and Weight Gain. HEENT Present- Hearing Loss, Hoarseness, Seasonal Allergies and Visual Disturbances. Not Present- Earache, Nose Bleed, Oral Ulcers, Ringing in the Ears, Sinus Pain, Sore Throat, Wears glasses/contact lenses and Yellow Eyes. Respiratory Present- Chronic Cough. Not Present- Bloody sputum, Difficulty Breathing, Snoring and Wheezing. Breast Present- Breast Mass. Not Present- Breast Pain, Nipple Discharge and Skin Changes. Cardiovascular Present- Leg Cramps. Not Present- Chest Pain, Difficulty Breathing Lying Down, Palpitations, Rapid Heart Rate, Shortness of Breath and Swelling of Extremities. Gastrointestinal Present- Abdominal Pain, Bloating, Change in Bowel Habits, Constipation, Difficulty Swallowing and Excessive gas. Not Present- Bloody Stool, Chronic diarrhea, Gets full quickly at meals,  Hemorrhoids, Indigestion, Nausea, Rectal Pain and Vomiting. Musculoskeletal Present- Joint Pain, Joint Stiffness, Muscle Pain and Muscle Weakness. Not Present- Back Pain and Swelling of Extremities. Neurological Present- Numbness, Trouble walking and Weakness. Not Present- Decreased Memory, Fainting, Headaches, Seizures, Tingling and Tremor. Endocrine Present- Cold Intolerance, Excessive Hunger and Heat Intolerance. Not Present- Hair Changes, Hot flashes and New Diabetes.  Vitals Occupational hygienist, BSN; 06/10/2014 1:49 PM) 06/10/2014 1:48 PM Weight: 139.2 lb Height: 63in Body Surface Area: 1.68 m Body Mass Index: 24.66 kg/m Temp.: 97.56F(Oral)  Pulse: 89 (Regular)  Resp.: 18 (Unlabored)  P.OX: 96% (Room air) BP: 118/64 (Sitting, Left Arm, Standard)    Physical Exam (Virdia Ziesmer A. Ninfa Linden MD; 06/10/2014 2:02 PM) General Mental Status-Alert. General Appearance-Consistent with stated age. Hydration-Well hydrated. Voice-Normal.  Head and Neck Head-normocephalic, atraumatic with no lesions or palpable masses. Trachea-midline. Thyroid Gland Characteristics - normal size and consistency.  Eye Eyeball - Bilateral-Extraocular movements intact. Sclera/Conjunctiva - Bilateral-No scleral icterus.  Chest and Lung Exam Chest and lung exam reveals -quiet, even and easy respiratory effort with no use of accessory muscles and on auscultation, normal breath sounds, no adventitious sounds and normal vocal resonance. Inspection Chest Wall - Normal. Back - normal.  Breast Breast - Left-Symmetric, Non Tender, No Biopsy scars, no Dimpling, No Inflammation, No Lumpectomy scars, No Mastectomy scars, No Peau d' Orange. Breast - Right-Normal. Breast Lump-No Palpable Breast Mass.  Cardiovascular Cardiovascular examination reveals -normal heart sounds, regular rate and rhythm with no murmurs and normal pedal pulses bilaterally.  Abdomen Inspection Inspection  of the abdomen reveals - No Hernias. Skin - Scar - no surgical scars. Palpation/Percussion Palpation and Percussion of the abdomen reveal - Soft, Non Tender, No Rebound tenderness, No Rigidity (guarding) and No hepatosplenomegaly. Auscultation Auscultation of the abdomen reveals - Bowel sounds normal.  Neurologic - Did not examine.  Musculoskeletal - Did not examine.  Lymphatic Head & Neck  General Head & Neck Lymphatics: Bilateral - Description - Normal. Axillary  General Axillary Region: Bilateral - Description - Normal. Tenderness - Non Tender. Femoral & Inguinal - Did not examine.    Assessment & Plan (Muhammad Vacca A. Ninfa Linden MD; 06/10/2014 2:03 PM) ATYPICAL LOBULAR HYPERPLASIA OF LEFT BREAST (610.8  N60.92) Impression: I discussed the diagnosis of the patient and her daughter. A radioactive seed localized left breast lumpectomy is recommended for complete histologic evaluation. I discussed the reasons for this with the patient in detail. I discussed the surgical procedure in detail. I discussed the risks which includes but is not limited to bleeding, infection, need for further surgery, cardiopulmonary issues, etc. They understand and wish to proceed with surgery

## 2014-06-25 ENCOUNTER — Ambulatory Visit (HOSPITAL_BASED_OUTPATIENT_CLINIC_OR_DEPARTMENT_OTHER): Payer: Medicare Other | Admitting: Certified Registered"

## 2014-06-25 ENCOUNTER — Ambulatory Visit (HOSPITAL_BASED_OUTPATIENT_CLINIC_OR_DEPARTMENT_OTHER)
Admission: RE | Admit: 2014-06-25 | Discharge: 2014-06-25 | Disposition: A | Discharge status: Home / Self Care | Payer: Medicare Other | Source: Ambulatory Visit | Attending: Surgery | Admitting: Surgery

## 2014-06-25 ENCOUNTER — Encounter (HOSPITAL_BASED_OUTPATIENT_CLINIC_OR_DEPARTMENT_OTHER)
Admission: RE | Disposition: A | Discharge status: Home / Self Care | Payer: Self-pay | Source: Ambulatory Visit | Attending: Surgery

## 2014-06-25 ENCOUNTER — Encounter (HOSPITAL_BASED_OUTPATIENT_CLINIC_OR_DEPARTMENT_OTHER): Payer: Self-pay | Admitting: *Deleted

## 2014-06-25 DIAGNOSIS — Z9049 Acquired absence of other specified parts of digestive tract: Secondary | ICD-10-CM | POA: Insufficient documentation

## 2014-06-25 DIAGNOSIS — Z886 Allergy status to analgesic agent status: Secondary | ICD-10-CM | POA: Insufficient documentation

## 2014-06-25 DIAGNOSIS — Z90710 Acquired absence of both cervix and uterus: Secondary | ICD-10-CM | POA: Insufficient documentation

## 2014-06-25 DIAGNOSIS — N6012 Diffuse cystic mastopathy of left breast: Secondary | ICD-10-CM | POA: Insufficient documentation

## 2014-06-25 DIAGNOSIS — Z881 Allergy status to other antibiotic agents status: Secondary | ICD-10-CM | POA: Insufficient documentation

## 2014-06-25 DIAGNOSIS — I10 Essential (primary) hypertension: Secondary | ICD-10-CM | POA: Diagnosis not present

## 2014-06-25 DIAGNOSIS — N62 Hypertrophy of breast: Secondary | ICD-10-CM | POA: Insufficient documentation

## 2014-06-25 DIAGNOSIS — Z79899 Other long term (current) drug therapy: Secondary | ICD-10-CM | POA: Insufficient documentation

## 2014-06-25 DIAGNOSIS — C50912 Malignant neoplasm of unspecified site of left female breast: Secondary | ICD-10-CM | POA: Diagnosis not present

## 2014-06-25 DIAGNOSIS — K5730 Diverticulosis of large intestine without perforation or abscess without bleeding: Secondary | ICD-10-CM | POA: Diagnosis not present

## 2014-06-25 DIAGNOSIS — E780 Pure hypercholesterolemia: Secondary | ICD-10-CM | POA: Diagnosis not present

## 2014-06-25 DIAGNOSIS — K219 Gastro-esophageal reflux disease without esophagitis: Secondary | ICD-10-CM | POA: Insufficient documentation

## 2014-06-25 DIAGNOSIS — Z888 Allergy status to other drugs, medicaments and biological substances status: Secondary | ICD-10-CM | POA: Diagnosis not present

## 2014-06-25 DIAGNOSIS — E079 Disorder of thyroid, unspecified: Secondary | ICD-10-CM | POA: Diagnosis not present

## 2014-06-25 DIAGNOSIS — Z882 Allergy status to sulfonamides status: Secondary | ICD-10-CM | POA: Insufficient documentation

## 2014-06-25 HISTORY — DX: Nausea with vomiting, unspecified: R11.2

## 2014-06-25 HISTORY — DX: Other specified postprocedural states: Z98.890

## 2014-06-25 HISTORY — PX: BREAST LUMPECTOMY WITH RADIOACTIVE SEED LOCALIZATION: SHX6424

## 2014-06-25 LAB — POCT HEMOGLOBIN-HEMACUE: Hemoglobin: 14.5 g/dL (ref 12.0–15.0)

## 2014-06-25 SURGERY — BREAST LUMPECTOMY WITH RADIOACTIVE SEED LOCALIZATION
Anesthesia: General | Site: Breast | Laterality: Left

## 2014-06-25 MED ORDER — CEFAZOLIN SODIUM-DEXTROSE 2-3 GM-% IV SOLR
2.0000 g | INTRAVENOUS | Status: AC
  Administered 2014-06-25: 2 g via INTRAVENOUS

## 2014-06-25 MED ORDER — FENTANYL CITRATE (PF) 100 MCG/2ML IJ SOLN: 50.0000 ug | INTRAMUSCULAR | Status: DC | PRN

## 2014-06-25 MED ORDER — ONDANSETRON HCL 4 MG/2ML IJ SOLN
INTRAMUSCULAR | Status: DC | PRN
  Administered 2014-06-25: 4 mg via INTRAVENOUS

## 2014-06-25 MED ORDER — SODIUM CHLORIDE 0.9 % IJ SOLN: 3.0000 mL | INTRAMUSCULAR | Status: DC | PRN

## 2014-06-25 MED ORDER — SODIUM CHLORIDE 0.9 % IJ SOLN: 3.0000 mL | Freq: Two times a day (BID) | INTRAMUSCULAR | Status: DC

## 2014-06-25 MED ORDER — LIDOCAINE HCL (CARDIAC) 20 MG/ML IV SOLN
INTRAVENOUS | Status: DC | PRN
  Administered 2014-06-25: 40 mg via INTRAVENOUS

## 2014-06-25 MED ORDER — PROPOFOL 10 MG/ML IV BOLUS
INTRAVENOUS | Status: DC | PRN
  Administered 2014-06-25: 120 mg via INTRAVENOUS

## 2014-06-25 MED ORDER — PROMETHAZINE HCL 25 MG/ML IJ SOLN: 6.2500 mg | INTRAMUSCULAR | Status: DC | PRN

## 2014-06-25 MED ORDER — FENTANYL CITRATE (PF) 100 MCG/2ML IJ SOLN
INTRAMUSCULAR | Status: DC | PRN
  Administered 2014-06-25: 25 ug via INTRAVENOUS
  Administered 2014-06-25: 50 ug via INTRAVENOUS

## 2014-06-25 MED ORDER — MIDAZOLAM HCL 2 MG/2ML IJ SOLN: 1.0000 mg | INTRAMUSCULAR | Status: DC | PRN

## 2014-06-25 MED ORDER — OXYCODONE HCL 5 MG PO TABS: 5.0000 mg | ORAL_TABLET | ORAL | Status: DC | PRN

## 2014-06-25 MED ORDER — TRAMADOL HCL 50 MG PO TABS: 50.0000 mg | ORAL_TABLET | Freq: Four times a day (QID) | ORAL | Status: DC | PRN

## 2014-06-25 MED ORDER — FENTANYL CITRATE (PF) 100 MCG/2ML IJ SOLN
INTRAMUSCULAR | Status: AC
  Filled 2014-06-25: qty 2

## 2014-06-25 MED ORDER — ACETAMINOPHEN 10 MG/ML IV SOLN
INTRAVENOUS | Status: AC
  Filled 2014-06-25: qty 100

## 2014-06-25 MED ORDER — SODIUM CHLORIDE 0.9 % IV SOLN: 250.0000 mL | INTRAVENOUS | Status: DC | PRN

## 2014-06-25 MED ORDER — FENTANYL CITRATE (PF) 100 MCG/2ML IJ SOLN
INTRAMUSCULAR | Status: AC
  Filled 2014-06-25: qty 6

## 2014-06-25 MED ORDER — GLYCOPYRROLATE 0.2 MG/ML IJ SOLN: 0.2000 mg | Freq: Once | INTRAMUSCULAR | Status: DC | PRN

## 2014-06-25 MED ORDER — ACETAMINOPHEN 325 MG PO TABS: 650.0000 mg | ORAL_TABLET | ORAL | Status: DC | PRN

## 2014-06-25 MED ORDER — FENTANYL CITRATE (PF) 100 MCG/2ML IJ SOLN
25.0000 ug | INTRAMUSCULAR | Status: DC | PRN
  Administered 2014-06-25: 50 ug via INTRAVENOUS

## 2014-06-25 MED ORDER — FENTANYL CITRATE (PF) 100 MCG/2ML IJ SOLN: 25.0000 ug | INTRAMUSCULAR | Status: DC | PRN

## 2014-06-25 MED ORDER — BUPIVACAINE-EPINEPHRINE 0.5% -1:200000 IJ SOLN
INTRAMUSCULAR | Status: DC | PRN
  Administered 2014-06-25: 14 mL

## 2014-06-25 MED ORDER — LACTATED RINGERS IV SOLN
INTRAVENOUS | Status: DC
  Administered 2014-06-25 (×2): via INTRAVENOUS

## 2014-06-25 MED ORDER — CEFAZOLIN SODIUM-DEXTROSE 2-3 GM-% IV SOLR
INTRAVENOUS | Status: AC
  Filled 2014-06-25: qty 50

## 2014-06-25 MED ORDER — DEXAMETHASONE SODIUM PHOSPHATE 4 MG/ML IJ SOLN
INTRAMUSCULAR | Status: DC | PRN
Start: 2014-06-25 — End: 2014-06-25
  Administered 2014-06-25: 10 mg via INTRAVENOUS

## 2014-06-25 MED ORDER — ACETAMINOPHEN 650 MG RE SUPP: 650.0000 mg | RECTAL | Status: DC | PRN

## 2014-06-25 MED ORDER — ACETAMINOPHEN 10 MG/ML IV SOLN
INTRAVENOUS | Status: DC | PRN
  Administered 2014-06-25: 1000 mg via INTRAVENOUS

## 2014-06-25 SURGICAL SUPPLY — 48 items
APPLIER CLIP 9.375 MED OPEN (MISCELLANEOUS)
BLADE HEX COATED 2.75 (ELECTRODE) ×3 IMPLANT
BLADE SURG 15 STRL LF DISP TIS (BLADE) ×1 IMPLANT
BLADE SURG 15 STRL SS (BLADE) ×2
CANISTER SUCT 1200ML W/VALVE (MISCELLANEOUS) IMPLANT
CHLORAPREP W/TINT 26ML (MISCELLANEOUS) ×3 IMPLANT
CLIP APPLIE 9.375 MED OPEN (MISCELLANEOUS) IMPLANT
CLIP TI WIDE RED SMALL 6 (CLIP) IMPLANT
CLOSURE WOUND 1/2 X4 (GAUZE/BANDAGES/DRESSINGS)
COVER BACK TABLE 60X90IN (DRAPES) ×3 IMPLANT
COVER MAYO STAND STRL (DRAPES) ×3 IMPLANT
COVER PROBE W GEL 5X96 (DRAPES) ×3 IMPLANT
DECANTER SPIKE VIAL GLASS SM (MISCELLANEOUS) IMPLANT
DEVICE DUBIN W/COMP PLATE 8390 (MISCELLANEOUS) ×3 IMPLANT
DRAPE LAPAROTOMY 100X72 PEDS (DRAPES) ×3 IMPLANT
DRAPE UTILITY XL STRL (DRAPES) ×3 IMPLANT
DRSG TEGADERM 4X4.75 (GAUZE/BANDAGES/DRESSINGS) IMPLANT
ELECT REM PT RETURN 9FT ADLT (ELECTROSURGICAL) ×3
ELECTRODE REM PT RTRN 9FT ADLT (ELECTROSURGICAL) ×1 IMPLANT
GLOVE BIOGEL PI IND STRL 7.0 (GLOVE) ×1 IMPLANT
GLOVE BIOGEL PI INDICATOR 7.0 (GLOVE) ×2
GLOVE ECLIPSE 6.5 STRL STRAW (GLOVE) ×3 IMPLANT
GLOVE EXAM NITRILE EXT CUFF MD (GLOVE) ×3 IMPLANT
GLOVE SURG SIGNA 7.5 PF LTX (GLOVE) ×3 IMPLANT
GOWN STRL REUS W/ TWL LRG LVL3 (GOWN DISPOSABLE) ×1 IMPLANT
GOWN STRL REUS W/ TWL XL LVL3 (GOWN DISPOSABLE) ×1 IMPLANT
GOWN STRL REUS W/TWL LRG LVL3 (GOWN DISPOSABLE) ×2
GOWN STRL REUS W/TWL XL LVL3 (GOWN DISPOSABLE) ×2
KIT MARKER MARGIN INK (KITS) ×3 IMPLANT
LIQUID BAND (GAUZE/BANDAGES/DRESSINGS) ×3 IMPLANT
NEEDLE HYPO 25X1 1.5 SAFETY (NEEDLE) ×3 IMPLANT
NS IRRIG 1000ML POUR BTL (IV SOLUTION) IMPLANT
PACK BASIN DAY SURGERY FS (CUSTOM PROCEDURE TRAY) ×3 IMPLANT
PENCIL BUTTON HOLSTER BLD 10FT (ELECTRODE) ×3 IMPLANT
SLEEVE SCD COMPRESS KNEE MED (MISCELLANEOUS) ×3 IMPLANT
SPONGE GAUZE 4X4 12PLY STER LF (GAUZE/BANDAGES/DRESSINGS) IMPLANT
SPONGE LAP 4X18 X RAY DECT (DISPOSABLE) ×3 IMPLANT
STRIP CLOSURE SKIN 1/2X4 (GAUZE/BANDAGES/DRESSINGS) IMPLANT
SUT MNCRL AB 4-0 PS2 18 (SUTURE) ×3 IMPLANT
SUT SILK 2 0 SH (SUTURE) IMPLANT
SUT VIC AB 3-0 SH 27 (SUTURE) ×2
SUT VIC AB 3-0 SH 27X BRD (SUTURE) ×1 IMPLANT
SYR CONTROL 10ML LL (SYRINGE) ×3 IMPLANT
TOWEL OR 17X24 6PK STRL BLUE (TOWEL DISPOSABLE) ×3 IMPLANT
TOWEL OR NON WOVEN STRL DISP B (DISPOSABLE) IMPLANT
TUBE CONNECTING 20'X1/4 (TUBING)
TUBE CONNECTING 20X1/4 (TUBING) IMPLANT
YANKAUER SUCT BULB TIP NO VENT (SUCTIONS) IMPLANT

## 2014-06-25 NOTE — Discharge Instructions (Signed)
[place] Office Phone Number [phone]  BREAST BIOPSY/ PARTIAL MASTECTOMY: POST OP INSTRUCTIONS  Always review your discharge instruction sheet given to you by the facility where your surgery was performed.  IF YOU HAVE DISABILITY OR FAMILY LEAVE FORMS, YOU MUST BRING THEM TO THE OFFICE FOR PROCESSING.  DO NOT GIVE THEM TO YOUR DOCTOR.  1. A prescription for pain medication may be given to you upon discharge.  Take your pain medication as prescribed, if needed.  If narcotic pain medicine is not needed, then you may take acetaminophen (Tylenol) or ibuprofen (Advil) as needed. 2. Take your usually prescribed medications unless otherwise directed 3. If you need a refill on your pain medication, please contact your pharmacy.  They will contact our office to request authorization.  Prescriptions will not be filled after 5pm or on week-ends. 4. You should eat very light the first 24 hours after surgery, such as soup, crackers, pudding, etc.  Resume your normal diet the day after surgery. 5. Most patients will experience some swelling and bruising in the breast.  Ice packs and a good support bra will help.  Swelling and bruising can take several days to resolve.  6. It is common to experience some constipation if taking pain medication after surgery.  Increasing fluid intake and taking a stool softener will usually help or prevent this problem from occurring.  A mild laxative (Milk of Magnesia or Miralax) should be taken according to package directions if there are no bowel movements after 48 hours. 7. Unless discharge instructions indicate otherwise, you may remove your bandages 24-48 hours after surgery, and you may shower at that time.  You may have steri-strips (small skin tapes) in place directly over the incision.  These strips should be left on the skin for 7-10 days.  If your surgeon used skin glue on the incision, you may shower in 24 hours.  The glue will flake off over the  next 2-3 weeks.  Any sutures or staples will be removed at the office during your follow-up visit. 8. ACTIVITIES:  You may resume regular daily activities (gradually increasing) beginning the next day.  Wearing a good support bra or sports bra minimizes pain and swelling.  You may have sexual intercourse when it is comfortable. a. You may drive when you no longer are taking prescription pain medication, you can comfortably wear a seatbelt, and you can safely maneuver your car and apply brakes. b. RETURN TO WORK:  ______________________________________________________________________________________ 9. You should see your doctor in the office for a follow-up appointment approximately two weeks after your surgery.  Your doctors nurse will typically make your follow-up appointment when she calls you with your pathology report.  Expect your pathology report 2-3 business days after your surgery.  You may call to check if you do not hear from [place] after three days. 10. OTHER INSTRUCTIONS: _____________________________OK TO SHOWER STARTING TOMORROW 11. ICE PACK, IBUPROFEN, TYLENOL FOR PAIN ALSO__________________________________________________________________ _____________________________________________________________________________________________________________________________________ _____________________________________________________________________________________________________________________________________ _____________________________________________________________________________________________________________________________________  WHEN TO CALL YOUR DOCTOR: 1. Fever over 101.0 2. Nausea and/or vomiting. 3. Extreme swelling or bruising. 4. Continued bleeding from incision. 5. Increased pain, redness, or drainage from the incision.  The clinic staff is available to answer your questions during regular business hours.  Please dont hesitate to call and ask to speak to one of the nurses  for clinical concerns.  If you have a medical emergency, go to the nearest emergency room or call 911.  A surgeon from [facility] is always on  call at the hospital.  For further questions, please visit [email or web address]    Post Anesthesia Home Care Instructions  Activity: Get plenty of rest for the remainder of the day. A responsible adult should stay with you for 24 hours following the procedure.  For the next 24 hours, DO NOT: -Drive a car -Paediatric nurse -Drink alcoholic beverages -Take any medication unless instructed by your physician -Make any legal decisions or sign important papers.  Meals: Start with liquid foods such as gelatin or soup. Progress to regular foods as tolerated. Avoid greasy, spicy, heavy foods. If nausea and/or vomiting occur, drink only clear liquids until the nausea and/or vomiting subsides. Call your physician if vomiting continues.  Special Instructions/Symptoms: Your throat may feel dry or sore from the anesthesia or the breathing tube placed in your throat during surgery. If this causes discomfort, gargle with warm salt water. The discomfort should disappear within 24 hours.  If you had a scopolamine patch placed behind your ear for the management of post- operative nausea and/or vomiting:  1. The medication in the patch is effective for 72 hours, after which it should be removed.  Wrap patch in a tissue and discard in the trash. Wash hands thoroughly with soap and water. 2. You may remove the patch earlier than 72 hours if you experience unpleasant side effects which may include dry mouth, dizziness or visual disturbances. 3. Avoid touching the patch. Wash your hands with soap and water after contact with the patch.

## 2014-06-25 NOTE — Anesthesia Procedure Notes (Signed)
Procedure Name: LMA Insertion Date/Time: 06/25/2014 9:29 AM Performed by: Lee Kalt D Pre-anesthesia Checklist: Patient identified, Emergency Drugs available, Suction available and Patient being monitored Patient Re-evaluated:Patient Re-evaluated prior to inductionOxygen Delivery Method: Circle System Utilized Preoxygenation: Pre-oxygenation with 100% oxygen Intubation Type: IV induction Ventilation: Mask ventilation without difficulty LMA: LMA inserted LMA Size: 4.0 Number of attempts: 1 Airway Equipment and Method: Bite block Placement Confirmation: positive ETCO2 Tube secured with: Tape Dental Injury: Teeth and Oropharynx as per pre-operative assessment

## 2014-06-25 NOTE — Op Note (Signed)
LEFT BREAST LUMPECTOMY WITH RADIOACTIVE SEED LOCALIZATION  Procedure Note  Erin Hunt 06/25/2014   Pre-op Diagnosis: Atypical Lobular Hyperplasia     Post-op Diagnosis: same  Procedure(s): LEFT BREAST LUMPECTOMY WITH RADIOACTIVE SEED LOCALIZATION  Surgeon(s): Coralie Keens, MD  Anesthesia: General  Staff:  Circulator: Ted Mcalpine, RN Relief Circulator: Vara Guardian, RN Relief Scrub: Vara Guardian, RN Scrub Person: Rhea Pink Neiers, CST  Estimated Blood Loss: Minimal               Specimens: sent to path          [facility]   Date: 06/25/2014  Time: 10:03 AM

## 2014-06-25 NOTE — Anesthesia Postprocedure Evaluation (Signed)
  Anesthesia Post-op Note  Patient: Erin Hunt  Procedure(s) Performed: Procedure(s): LEFT BREAST LUMPECTOMY WITH RADIOACTIVE SEED LOCALIZATION (Left)  Patient Location: PACU  Anesthesia Type: General   Level of Consciousness: awake, alert  and oriented  Airway and Oxygen Therapy: Patient Spontanous Breathing  Post-op Pain: none  Post-op Assessment: Post-op Vital signs reviewed  Post-op Vital Signs: Reviewed  Last Vitals:  Filed Vitals:   06/25/14 1030  BP: 146/61  Pulse: 77  Temp:   Resp: 13    Complications: No apparent anesthesia complications

## 2014-06-25 NOTE — Transfer of Care (Signed)
Immediate Anesthesia Transfer of Care Note  Patient: Erin Hunt  Procedure(s) Performed: Procedure(s): LEFT BREAST LUMPECTOMY WITH RADIOACTIVE SEED LOCALIZATION (Left)  Patient Location: PACU  Anesthesia Type:General  Level of Consciousness: awake and patient cooperative  Airway & Oxygen Therapy: Patient Spontanous Breathing and Patient connected to face mask oxygen  Post-op Assessment: Report given to RN and Post -op Vital signs reviewed and stable  Post vital signs: Reviewed and stable  Last Vitals:  Filed Vitals:   06/25/14 1008  BP:   Pulse: 83  Temp:   Resp: 24    Complications: No apparent anesthesia complications

## 2014-06-25 NOTE — Interval H&P Note (Signed)
History and Physical Interval Note: no change in H and P  06/25/2014 9:01 AM  Erin Hunt  has presented today for surgery, with the diagnosis of Atypical Lobular Hyperplasia  The various methods of treatment have been discussed with the patient and family. After consideration of risks, benefits and other options for treatment, the patient has consented to  Procedure(s): LEFT BREAST LUMPECTOMY WITH RADIOACTIVE SEED LOCALIZATION (Left) as a surgical intervention .  The patient's history has been reviewed, patient examined, no change in status, stable for surgery.  I have reviewed the patient's chart and labs.  Questions were answered to the patient's satisfaction.     [name] A

## 2014-06-25 NOTE — Anesthesia Preprocedure Evaluation (Addendum)
Anesthesia Evaluation  Patient identified by MRN, date of birth, ID band Patient awake    Reviewed: Allergy & Precautions, NPO status , Patient's Chart, lab work & pertinent test results  History of Anesthesia Complications (+) PONV  Airway Mallampati: II  TM Distance: >3 FB Neck ROM: Full    Dental no notable dental hx.    Pulmonary neg pulmonary ROS,  breath sounds clear to auscultation  Pulmonary exam normal       Cardiovascular hypertension, Normal cardiovascular examRhythm:Regular Rate:Normal  - Left ventricle: The cavity size was normal. Systolic function was normal. The estimated ejection fraction was in the range of 55% to 60%. Wall motion was normal; there were no regional wall motion abnormalities. - Aortic valve: not well visualized cannot tell if it is tri leaflet There was mild stenosis. Valve area: 1.02cm^2(VTI). Valve area: 0.98cm^2 (Vmax). - Atrial septum: No defect or patent foramen ovale was identified.    Neuro/Psych negative neurological ROS  negative psych ROS   GI/Hepatic Neg liver ROS, GERD-  Medicated,  Endo/Other  negative endocrine ROS  Renal/GU negative Renal ROS  negative genitourinary   Musculoskeletal negative musculoskeletal ROS (+)   Abdominal   Peds negative pediatric ROS (+)  Hematology negative hematology ROS (+)   Anesthesia Other Findings   Reproductive/Obstetrics negative OB ROS                            Anesthesia Physical Anesthesia Plan  ASA: III  Anesthesia Plan: General   Post-op Pain Management:    Induction: Intravenous  Airway Management Planned: LMA  Additional Equipment:   Intra-op Plan:   Post-operative Plan: Extubation in OR  Informed Consent: I have reviewed the patients History and Physical, chart, labs and discussed the procedure including the risks, benefits and alternatives for the proposed anesthesia  with the patient or authorized representative who has indicated his/her understanding and acceptance.   Dental advisory given  Plan Discussed with: CRNA and Surgeon  Anesthesia Plan Comments:         Anesthesia Quick Evaluation

## 2014-06-26 ENCOUNTER — Encounter (HOSPITAL_BASED_OUTPATIENT_CLINIC_OR_DEPARTMENT_OTHER): Payer: Self-pay | Admitting: Surgery

## 2014-06-26 NOTE — Op Note (Signed)
Erin Hunt, Erin Hunt                ACCOUNT NO.:  [id number]  MEDICAL RECORD NO.:  [id number]  LOCATION:                               FACILITY:  [facility]  PHYSICIAN:  Coralie Keens, M.D. DATE OF BIRTH:  06-14-1928  DATE OF PROCEDURE:  06/25/2014 DATE OF DISCHARGE:                              OPERATIVE REPORT   PREOPERATIVE DIAGNOSIS:  Atypical lobular hyperplasia of the left breast.  POSTOPERATIVE DIAGNOSIS:  Atypical lobular hyperplasia of the left breast.  PROCEDURE:  Radioactive seed localized left breast lumpectomy.  SURGEON:  Coralie Keens, M.D.  ANESTHESIA:  General and 0.5% Marcaine.  ESTIMATED BLOOD LOSS:  Minimal.  INDICATIONS:  This is an 81 year old female who has had atypical cells of calcifications in the left breast, confirmed on stereotactic biopsy. Decision was made to proceed with the radioactive seed localized left breast lumpectomy.  FINDINGS:  The suspicious calcifications with the previous marker and the radioactive seed were found to be in the lumpectomy specimen on postoperative x-ray.  PROCEDURE IN DETAIL:  The patient was identified in the holding area as [name].  The Neoprobe was brought into the room and the radioactive seed was confirmed to be in the left breast.  She was then taken to the operating room and placed in a supine position on the operating room table.  General anesthesia was then induced.  Her left breast was then prepped and draped in usual sterile fashion.  I again identified the area of increased uptake which was retroareolar.  I anesthetized the skin at the upper edge of the areola with Marcaine.  I made a circumareolar incision with a scalpel.  I took this down to the breast tissue with the cautery.  I then performed a wide lumpectomy going down to the chest wall with the electrocautery, staying well around the radioactive seed with the aid of the Neoprobe.  Once the specimen was completely removed, I marked all  margins with marker paint. It was x-rayed and the radioactive seed as well as the previous biopsy seed were located within the specimen.  The surrounding breast tissue and all areas was quite dense and firm in the remaining breast cavity. The specimen sent to Pathology for evaluation.  I anesthetized the wound further with Marcaine.  Hemostasis was achieved with cautery.  I closed the subcutaneous tissue with interrupted 3-0 Vicryl sutures and closed the skin with a running 4-0 Monocryl.  Skin glue was then applied.  The patient tolerated the procedure well.  All the counts were correct at the end of procedure. The patient was then extubated in the operating room and taken in stable condition to recovery room.     Coralie Keens, M.D.     DB/MEDQ  D:  06/25/2014  T:  06/26/2014  Job:  [id number]

## 2014-07-10 ENCOUNTER — Ambulatory Visit
Admission: RE | Admit: 2014-07-10 | Discharge: 2014-07-10 | Disposition: A | Discharge status: Home / Self Care | Payer: Medicare Other | Source: Ambulatory Visit | Attending: Nurse Practitioner | Admitting: Nurse Practitioner

## 2014-07-10 ENCOUNTER — Other Ambulatory Visit: Payer: Self-pay | Admitting: Nurse Practitioner

## 2014-07-10 DIAGNOSIS — J069 Acute upper respiratory infection, unspecified: Secondary | ICD-10-CM

## 2014-07-10 DIAGNOSIS — R490 Dysphonia: Secondary | ICD-10-CM | POA: Insufficient documentation

## 2014-07-29 ENCOUNTER — Ambulatory Visit (INDEPENDENT_AMBULATORY_CARE_PROVIDER_SITE_OTHER): Payer: Medicare Other | Admitting: Internal Medicine

## 2014-07-29 ENCOUNTER — Encounter: Payer: Self-pay | Admitting: Internal Medicine

## 2014-07-29 ENCOUNTER — Telehealth: Payer: Self-pay | Admitting: Internal Medicine

## 2014-07-29 VITALS — BP 118/60 | HR 75 | Ht 63.0 in | Wt 139.0 lb

## 2014-07-29 DIAGNOSIS — R058 Other specified cough: Secondary | ICD-10-CM

## 2014-07-29 DIAGNOSIS — R05 Cough: Secondary | ICD-10-CM

## 2014-07-29 DIAGNOSIS — I2510 Atherosclerotic heart disease of native coronary artery without angina pectoris: Secondary | ICD-10-CM

## 2014-07-29 DIAGNOSIS — R0982 Postnasal drip: Secondary | ICD-10-CM | POA: Insufficient documentation

## 2014-07-29 MED ORDER — FAMOTIDINE 20 MG PO TABS: ORAL_TABLET | ORAL | Status: DC

## 2014-07-29 MED ORDER — FLUTTER DEVI: Status: DC

## 2014-07-29 MED ORDER — ESOMEPRAZOLE MAGNESIUM 40 MG PO CPDR: 40.0000 mg | DELAYED_RELEASE_CAPSULE | Freq: Two times a day (BID) | ORAL | Status: DC

## 2014-07-29 NOTE — Telephone Encounter (Signed)
Medication has been added to pt's medication list. Nothing further was needed.

## 2014-07-29 NOTE — Progress Notes (Signed)
Subjective:    Patient ID: Erin Hunt, female    DOB: [birth year]     MRN: [id number]  HPI  25 yowf never smoker new onset cough mid 2005 almost completely resolves p abx and prednisone/ on allergy shots x 2015 from [place] but stopped 07/2014 referred back to pulmonary clinic 07/29/2014 by Dr Kahn/ [facility].    07/29/2014 1st [facility] office visit/ Erin Hunt   Chief Complaint  Patient presents with  . Pulmonary Consult    Referred by Beverlyn Roux. Pt c/o cough and hoarsenss for the past 6-8 months. Cough is esp worse at night and is prod at times with clear sputum.   Always better p   abx plus prednisone but last round prednisone by itself  better only for a few days and mucus better but did compltely white stayed yellow  Clearing throat even p prednisone  Taking nexium before bfast and cough   tends to be worse before supper and extends until hs / feels first thing in am    No obvious other patterns in day to day or daytime variabilty or assoc sob  cp or chest tightness, subjective wheeze or overt   hb symptoms. No unusual exp hx or h/o childhood pna/ asthma or knowledge of premature birth.  Sleeping ok once settles down  without nocturnal    exacerbation  of respiratory  c/o's or need for noct saba. Also denies any obvious fluctuation of symptoms with weather or environmental changes or other aggravating or alleviating factors except as outlined above   Current Medications, Allergies, Complete Past Medical History, Past Surgical History, Family History, and Social History were reviewed in [employer] record.            Review of Systems  Constitutional: Negative for fever, chills and unexpected weight change.  HENT: Positive for trouble swallowing. Negative for congestion, dental problem, ear pain, nosebleeds, postnasal drip, rhinorrhea, sinus pressure, sneezing, sore throat and voice change.   Eyes: Negative for visual disturbance.  Respiratory:  Positive for cough. Negative for choking and shortness of breath.   Cardiovascular: Negative for chest pain and leg swelling.  Gastrointestinal: Negative for vomiting, abdominal pain and diarrhea.  Genitourinary: Negative for difficulty urinating.  Musculoskeletal: Negative for arthralgias.  Skin: Negative for rash.  Neurological: Negative for tremors, syncope and headaches.  Hematological: Does not bruise/bleed easily.       Objective:   Physical Exam amb wf nad with freq dry sounding throat clearing duing interview and exam   Wt Readings from Last 3 Encounters:  07/29/14 139 lb (63.05 kg)  06/25/14 139 lb 4 oz (63.163 kg)  06/05/14 140 lb 1.6 oz (63.549 kg)    Vital signs reviewed  HEENT: nl dentition, turbinates, and orophanx. Nl external ear canals without cough reflex   NECK :  without JVD/Nodes/TM/ nl carotid upstrokes bilaterally   LUNGS: no acc muscle use, clear to A and P bilaterally without cough on insp or exp maneuvers   CV:  RRR  no s3 or murmur or increase in P2, no edema   ABD:  soft and nontender with nl excursion in the supine position. No bruits or organomegaly, bowel sounds nl  MS:  warm without deformities, calf tenderness, cyanosis or clubbing  SKIN: warm and dry without lesions    NEURO:  alert, approp, no deficits     I personally reviewed images and agree with radiology impression as follows:  CXR:  07/10/14 No active  cardiopulmonary disease.          Assessment & Plan:

## 2014-07-29 NOTE — Patient Instructions (Addendum)
Nexium Take 30- 60 min before your first and last meals of the day and add pepcid ac 20 mg at bedtime  GERD (REFLUX)  is an extremely common cause of respiratory symptoms just like yours , many times with no obvious heartburn at all.    It can be treated with medication, but also with lifestyle changes including elevation of the head of your bed (ideally with 6 inch  bed blocks),  Smoking cessation, avoidance of late meals, excessive alcohol, and avoid fatty foods, chocolate, peppermint, colas, red wine, and acidic juices such as orange juice.  NO MINT OR MENTHOL PRODUCTS SO NO COUGH DROPS  USE SUGARLESS CANDY INSTEAD (Jolley ranchers or Stover's or Life Savers) or even ice chips will also do - the key is to swallow to prevent all throat clearing. NO OIL BASED VITAMINS - use powdered substitutes.   Please see patient coordinator before you leave today  to schedule Dg Esophagram   When cough mucinex dm up to 1200 mg every 12 hours and use flutter valve valve as much as possible  Please schedule a follow up office visit in 6 weeks, call sooner if needed  ? If singulair ever tried?

## 2014-07-29 NOTE — Assessment & Plan Note (Signed)
The most common causes of chronic cough in immunocompetent adults include the following: upper airway cough syndrome (UACS), previously referred to as postnasal drip syndrome (PNDS), which is caused by variety of rhinosinus conditions; (2) asthma; (3) GERD; (4) chronic bronchitis from cigarette smoking or other inhaled environmental irritants; (5) nonasthmatic eosinophilic bronchitis; and (6) bronchiectasis.   These conditions, singly or in combination, have accounted for up to 94% of the causes of chronic cough in prospective studies.   Other conditions have constituted no >6% of the causes in prospective studies These have included bronchogenic carcinoma, chronic interstitial pneumonia, sarcoidosis, left ventricular failure, ACEI-induced cough, and aspiration from a condition associated with pharyngeal dysfunction.    Chronic cough is often simultaneously caused by more than one condition. A single cause has been found from 38 to 82% of the time, multiple causes from 18 to 62%. Multiply caused cough has been the result of three diseases up to 42% of the time.       Based on hx and exam, this is most likely:  Classic Upper airway cough syndrome, so named because it's frequently impossible to sort out how much is  CR/sinusitis with freq throat clearing (which can be related to primary GERD)   vs  causing  secondary (" extra esophageal")  GERD from wide swings in gastric pressure that occur with throat clearing, often  promoting self use of mint and menthol lozenges that reduce the lower esophageal sphincter tone and exacerbate the problem further in a cyclical fashion.   These are the same pts (now being labeled as having "irritable larynx syndrome" by some cough centers) who not infrequently have a history of having failed to tolerate ace inhibitors,  dry powder inhalers or biphosphonates or report having atypical reflux symptoms that don't respond to standard doses of PPI , and are easily confused as  having aecopd or asthma flares by even experienced allergists/ pulmonologists.   The first step is to maximize acid suppression and  Evaluate for es dysfunction with esphagram.   I had an extended discussion with the patient and daughter Langley Gauss  reviewing all relevant studies completed to date  X 65m  1) response to abx and prednisone suggests sinus dz but assured by ent this is not the case so need to purse GI sources next  2) Dg Esphagram needed for dysphagia  3) The standardized cough guidelines published in Chest by Lissa Morales in 2006 are still the best available and consist of a multiple step process (up to 12!) , not a single office visit,  and are intended  to address this problem logically,  with an alogrithm dependent on response to empiric treatment at  each progressive step  to determine a specific diagnosis with  minimal addtional testing needed. Therefore if adherence is an issue or can't be accurately verified,  it's very unlikely the standard evaluation and treatment will be successful here.    Furthermore, response to therapy (other than acute cough suppression, which should only be used short term with avoidance of narcotic containing cough syrups if possible), can be a gradual process for which the patient may perceive immediate benefit.  Unlike going to an eye doctor where the best perscription is almost always the first one and is immediately effective, this is almost never the case in the management of chronic cough syndromes. Therefore the patient needs to commit up front to consistently adhere to recommendations  for up to 6 weeks of therapy directed at the likely  underlying problem(s) before the response can be reasonably evaluated.   Each maintenance medication was reviewed in detail including most importantly the difference between maintenance and as needed and under what circumstances the prns are to be used.  Please see instructions for details which were reviewed in  writing and the patient given a copy.    Asked to check when gets home to be sure avs meds are correct as listed

## 2014-07-30 ENCOUNTER — Telehealth: Payer: Self-pay | Admitting: Internal Medicine

## 2014-07-30 NOTE — Telephone Encounter (Signed)
Both are fine.

## 2014-07-30 NOTE — Telephone Encounter (Signed)
Patient's daughter notified. Nothing further needed.  

## 2014-07-30 NOTE — Telephone Encounter (Signed)
Patient's daughter says that another physician prescribed Sucralate, Levocetrizine.   Is this okay for patient to take?  MW - please advise.

## 2014-08-05 ENCOUNTER — Ambulatory Visit (HOSPITAL_COMMUNITY)
Admission: RE | Admit: 2014-08-05 | Discharge: 2014-08-05 | Disposition: A | Discharge status: Home / Self Care | Payer: Medicare Other | Source: Ambulatory Visit | Attending: Internal Medicine | Admitting: Internal Medicine

## 2014-08-05 DIAGNOSIS — K224 Dyskinesia of esophagus: Secondary | ICD-10-CM | POA: Diagnosis not present

## 2014-08-05 DIAGNOSIS — R05 Cough: Secondary | ICD-10-CM | POA: Diagnosis present

## 2014-08-05 DIAGNOSIS — R490 Dysphonia: Secondary | ICD-10-CM | POA: Insufficient documentation

## 2014-08-05 DIAGNOSIS — R058 Other specified cough: Secondary | ICD-10-CM

## 2014-08-05 NOTE — Progress Notes (Signed)
Quick Note:  LMTCB ______ 

## 2014-08-06 ENCOUNTER — Telehealth: Payer: Self-pay | Admitting: Internal Medicine

## 2014-08-06 NOTE — Telephone Encounter (Signed)
Pt calling about test results and can be reached [phone].Erin Hunt

## 2014-08-06 NOTE — Telephone Encounter (Signed)
Per DG esophagus result: Result Note     Call patient : Study shows no surprises so no change in recs - it does show some minor abnormalities when swallowing but no strictures, need to share with primary and GI if following  --  Called pt. NA VM not set up. WCB

## 2014-08-07 NOTE — Telephone Encounter (Signed)
Pt is aware of results. She verbalized understanding. Reports sent to PCP and Dr. Wallis [facility]). Nothing further needed

## 2014-08-11 ENCOUNTER — Telehealth: Payer: Self-pay | Admitting: Internal Medicine

## 2014-08-11 NOTE — Telephone Encounter (Signed)
Called and spoke to pt's daughter, [name]. Informed her of the results per MW. Pt verbalized understanding and denied any further questions or concerns at this time.    Notes Recorded by Tanda Rockers, MD on 08/05/2014 at 3:38 PM Call patient : Study shows no surprises so no change in recs - it does show some minor abnormalities when swallowing but no strictures, need to share with primary and GI if following

## 2014-08-19 ENCOUNTER — Ambulatory Visit (INDEPENDENT_AMBULATORY_CARE_PROVIDER_SITE_OTHER): Payer: Medicare Other | Admitting: Internal Medicine

## 2014-08-19 ENCOUNTER — Encounter (INDEPENDENT_AMBULATORY_CARE_PROVIDER_SITE_OTHER): Payer: Self-pay

## 2014-08-19 ENCOUNTER — Encounter: Payer: Self-pay | Admitting: Internal Medicine

## 2014-08-19 VITALS — BP 160/70 | HR 86 | Ht 63.0 in | Wt 139.2 lb

## 2014-08-19 DIAGNOSIS — I2510 Atherosclerotic heart disease of native coronary artery without angina pectoris: Secondary | ICD-10-CM | POA: Diagnosis not present

## 2014-08-19 DIAGNOSIS — R079 Chest pain, unspecified: Secondary | ICD-10-CM

## 2014-08-19 DIAGNOSIS — R55 Syncope and collapse: Secondary | ICD-10-CM | POA: Diagnosis not present

## 2014-08-19 NOTE — Patient Instructions (Signed)
Medication Instructions:  Your physician recommends that you continue on your current medications as directed. Please refer to the Current Medication list given to you today.   Labwork: none  Testing/Procedures: none  Follow-Up: Your physician recommends that you schedule a follow-up appointment in: three months with Dr. Caryl Comes   Any Other Special Instructions Will Be Listed Below (If Applicable). Please wear an abdominal binder. This can be purchased at [facility], [facility], etc. Elevate the head of the bed 3 inches.

## 2014-08-19 NOTE — Progress Notes (Signed)
ELECTROPHYSIOLOGY CONSULT NOTE  Patient ID: Erin Hunt, MRN: [id number], DOB/AGE: 1928/05/22 81 y.o.  Date of Consult: 08/19/2014  Primary Physician: Perrin Maltese, MD Primary Cardiologist: [facility]  Chief Complaint: ?POTS   HPI Erin Hunt is a 81 y.o. female  Brought in by her daughter who is a Marine scientist at [employer] because of concerns of "POTS." This was referring to a baseline tachycardia which is long-standing i.e. heart rates about 100 as well as a history of heat and orthostatic intolerance.  She has a.m. weakness and fatigue as well as lightheadedness. She has some difficulties after showers.  She's had a history of hyponatremia although these records are not available. The family has treated her with "electrolyte solution" with marked improvement. She also has a history of dilutional hyponatremia.    ECG LBBB   Echo 3/15>> normal LV function ; mild AS  Cath remote no obstructive CAD this was about 5 years ago.   Past Medical History  Diagnosis Date  . Anemia   . IgG gammopathy   . Arthritis   . Diverticulosis   . GERD (gastroesophageal reflux disease)   . Allergic rhinitis   . IBS (irritable bowel syndrome)   . Fibromyalgia   . Carpal tunnel syndrome   . Cervical spine degeneration   . CAD (coronary artery disease)   . Hypertension   . Vitamin B12 deficiency   . LBBB (left bundle branch block)   . PONV (postoperative nausea and vomiting)       Surgical History:  Past Surgical History  Procedure Laterality Date  . Tsa    . Vesicovaginal fistula closure w/ tah    . Cardiac catheterization    . Cholecystectomy    . Breast lumpectomy with radioactive seed localization Left 06/25/2014    Procedure: LEFT BREAST LUMPECTOMY WITH RADIOACTIVE SEED LOCALIZATION;  Surgeon: Coralie Keens, MD;  Location: [place];  Service: General;  Laterality: Left;     Home Meds: Prior to Admission medications   Medication Sig Start Date End Date Taking?  Authorizing Provider  amLODipine (NORVASC) 5 MG tablet TAKE 1 TABLET DAILY 02/04/14  Yes Belva Crome, MD  aspirin 81 MG EC tablet Take 1 tablet (81 mg total) by mouth 3 (three) times a week. Mon,Wed,Fri 08/20/13  Yes Belva Crome, MD  BIOTIN PO Take 1,000 mg by mouth daily.   Yes Historical Provider, MD  celecoxib (CELEBREX) 200 MG capsule Take 200 mg by mouth as needed.  06/10/13  Yes Historical Provider, MD  Cholecalciferol (VITAMIN D3) 1000 UNITS CAPS Take 1 tablet by mouth daily.    Yes Historical Provider, MD  cyclobenzaprine (FLEXERIL) 10 MG tablet Take 10 mg by mouth as needed.  05/25/13  Yes Historical Provider, MD  EPINEPHrine (EPIPEN 2-PAK) 0.3 mg/0.3 mL IJ SOAJ injection Inject 0.3 mg as directed once.  04/02/13  Yes Historical Provider, MD  esomeprazole (NEXIUM) 40 MG capsule Take 1 capsule (40 mg total) by mouth 2 (two) times daily before a meal. 07/29/14  Yes Tanda Rockers, MD  estropipate (OGEN) 1.5 MG tablet Take 1.5 mg by mouth daily. 04/01/13  Yes Historical Provider, MD  famotidine (PEPCID) 20 MG tablet One at bedtime 07/29/14  Yes Tanda Rockers, MD  hyoscyamine (LEVSIN SL) 0.125 MG SL tablet Take 0.125 mg by mouth every 6 (six) hours as needed.  05/01/13  Yes Historical Provider, MD  ipratropium (ATROVENT) 0.06 % nasal spray Place into the nose 2 (two)  times daily as needed. 04/15/13  Yes Historical Provider, MD  magnesium gluconate (MAGONATE) 500 MG tablet Take 500 mg by mouth daily.    Yes Historical Provider, MD  nitroGLYCERIN (NITROSTAT) 0.4 MG SL tablet Place 1 tablet (0.4 mg total) under the tongue every 5 (five) minutes as needed for chest pain. 08/15/13  Yes Belva Crome, MD  nystatin cream (MYCOSTATIN) Apply 1 application topically as needed.  04/15/13  Yes Historical Provider, MD  Respiratory Therapy Supplies (FLUTTER) DEVI Use as directed 07/29/14  Yes Tanda Rockers, MD  valsartan (DIOVAN) 160 MG tablet Take 1 tablet (160 mg total) by mouth daily. 01/31/13  Yes Belva Crome, MD       Allergies:  Allergies  Allergen Reactions  . Aspirin Other (See Comments)    Ecchymosis with high doses  . Codeine Sulfate Nausea Only  . Erythromycin Nausea And Vomiting  . Sulfa Antibiotics Nausea Only  . Sulfonamide Derivatives Nausea Only    History   Social History  . Marital Status: Widowed    Spouse Name: N/A  . Number of Children: N/A  . Years of Education: N/A   Occupational History  . Retired     Immunologist   Social History Main Topics  . Smoking status: Never Smoker   . Smokeless tobacco: Not on file  . Alcohol Use: No  . Drug Use: No  . Sexual Activity: Not Currently   Other Topics Concern  . Not on file   Social History Narrative     Family History  Problem Relation Age of Onset  . Diverticulosis Mother   . Coronary artery disease Father   . Heart disease Paternal Aunt   . Heart disease Paternal Uncle   . Breast cancer Paternal Aunt   . Prostate cancer Brother   . Leukemia Brother   . Heart attack Brother   . Hypertension Brother   . Stroke Brother      ROS:  Please see the history of present illness.     All other systems reviewed and negative.    Physical Exam: Blood pressure 160/70, pulse 86, height 5\' 3"  (1.6 m), weight 63.163 kg (139 lb 4 oz). General: Well developed, well nourished female in no acute distress. Head: Normocephalic, atraumatic, sclera non-icteric, no xanthomas, nares are without discharge. EENT: normal Lymph Nodes:  none Back: without scoliosis/kyphosis, no CVA tendersness Neck: Negative for carotid bruits. JVD not elevated. Lungs: Clear bilaterally to auscultation without wheezes, rales, or rhonchi. Breathing is unlabored. Heart: RRR with S1 S2 (preserved).  2/6 systolic   murmur , rubs, or gallops appreciated. Abdomen: Soft, non-tender, non-distended with normoactive bowel sounds. No hepatomegaly. No rebound/guarding. No obvious abdominal masses. Msk:  Strength and tone appear normal for  age. Extremities: No clubbing or cyanosis. No  edema.  Distal pedal pulses are 2+ and equal bilaterally. Skin: Warm and Dry Neuro: Alert and oriented X 3. CN III-XII intact Grossly normal sensory and motor function . Psych:  Responds to questions appropriately with a normal affect.      Labs: Cardiac Enzymes No results for input(s): CKTOTAL, CKMB, TROPONINI in the last 72 hours. CBC Lab Results  Component Value Date   WBC 7.1 06/05/2014   HGB 14.5 06/25/2014   HCT 40.9 06/05/2014   MCV 84.5 06/05/2014   PLT 323 06/05/2014   PROTIME: No results for input(s): LABPROT, INR in the last 72 hours. Chemistry No results for input(s): NA, K, CL, CO2, BUN, CREATININE,  CALCIUM, PROT, BILITOT, ALKPHOS, ALT, AST, GLUCOSE in the last 168 hours.  Invalid input(s): LABALBU Lipids Lab Results  Component Value Date   CHOL 246* 08/05/2013   HDL 51.70 08/05/2013   LDLCALC 158* 08/05/2013   TRIG 180.0* 08/05/2013   BNP PRO B NATRIURETIC PEPTIDE (BNP)  Date/Time Value Ref Range Status  04/09/2013 12:35 PM 376.6 0 - 450 pg/mL Final  04/16/2009 03:29 PM 127.0* 0.0 - 100.0 pg/mL Final  03/19/2008 06:13 PM <30.0 0.0 - 100.0 pg/mL Final   Thyroid Function Tests: No results for input(s): TSH, T4TOTAL, T3FREE, THYROIDAB in the last 72 hours.  Invalid input(s): FREET3    Miscellaneous No results found for: DDIMER  Radiology/Studies:  Dg Esophagus  08/05/2014   CLINICAL DATA:  81 year old female is history of cough and hoarseness of voice. Difficulty swallowing. Pain in the lower esophagus for the past year.  EXAM: ESOPHOGRAM / BARIUM SWALLOW / BARIUM TABLET STUDY  TECHNIQUE: Combined double contrast and single contrast examination performed using effervescent crystals, thick barium liquid, and thin barium liquid. The patient was observed with fluoroscopy swallowing a 13 mm barium sulphate tablet.  FLUOROSCOPY TIME:  If the device does not provide the exposure index:  Fluoroscopy Time:  2 minutes  and 50 seconds.  Number of Acquired Images:  31  COMPARISON:  No priors.  FINDINGS: Double contrast images of the esophagus demonstrated a normal appearance of the esophageal mucosa. Multiple single swallow attempts were observed, which demonstrated failure of all of the primary peristaltic waves to adequately propagate. Multiple tertiary contractions were noted. Full column esophagram demonstrated no evidence of esophageal mass, stricture, ring or hiatal hernia. Water siphon test was negative for gastroesophageal reflux. A barium tablet was administered which passed readily into the stomach.  IMPRESSION: 1. Nonspecific esophageal motility disorder with multiple tertiary contractions. 2. Otherwise, normal esophagram.   Electronically Signed   By: Vinnie Langton M.D.   On: 08/05/2014 12:21    EKG: NSR 86  18/15/37 LBBB LAD   Assessment and Plan:   Systolic Hypertension  Orhtostatic hypotension  The patient has significant symptomatic orthostasis. We have discussed extensively the physiology of orthostatic intolerance and the relative contributions of volume, intravascular depletion etc. In addition, we have discussed the role of isometric contraction as a potential mitigating factor and in addition the role of venous compression. We will continue to work on augmenting volume intake as well as use an abdominal binder. I recommended the use of six-inch blocks at the head of the bed and isometric contraction prior to standing. Further, I suggested that she might fare better taking her basilar night. We've reviewed situations where she will be attentive to risk i.e. dehydration etc. currently her systolic blood pressure while high is associated with a such a significant fall that I will not address it. The hopes that we can mitigate some of her orthostasis we can then target systolic hypertension       Virl Axe

## 2014-08-26 ENCOUNTER — Ambulatory Visit: Payer: Federal, State, Local not specified - PPO | Admitting: Internal Medicine

## 2014-08-28 ENCOUNTER — Telehealth: Payer: Self-pay | Admitting: *Deleted

## 2014-08-28 NOTE — Telephone Encounter (Signed)
Received cover my meds Key: [id number] Nexium 40mg  twice day.

## 2014-08-28 NOTE — Telephone Encounter (Signed)
The Prior Authorization request has been approved for [facility]. The authorization is valid from 06/29/2014 through 08/28/2015. You may advise your patient of the outcome. A letter of explanation will also be mailed to the patient. This PA determination only applies to medications dispensed by a pharmacy and billed to the [facility] pharmacy benefit. Now you can get responses to drug PAs immediately and securely online - without faxes, phone calls, or waiting. How? With electronic prior authorizat

## 2014-08-29 DIAGNOSIS — H903 Sensorineural hearing loss, bilateral: Secondary | ICD-10-CM | POA: Insufficient documentation

## 2014-09-09 ENCOUNTER — Ambulatory Visit (INDEPENDENT_AMBULATORY_CARE_PROVIDER_SITE_OTHER): Payer: Medicare Other | Admitting: Internal Medicine

## 2014-09-09 ENCOUNTER — Encounter: Payer: Self-pay | Admitting: Internal Medicine

## 2014-09-09 ENCOUNTER — Ambulatory Visit (INDEPENDENT_AMBULATORY_CARE_PROVIDER_SITE_OTHER)
Admission: RE | Admit: 2014-09-09 | Discharge: 2014-09-09 | Disposition: A | Discharge status: Home / Self Care | Payer: Medicare Other | Source: Ambulatory Visit | Attending: Internal Medicine | Admitting: Internal Medicine

## 2014-09-09 VITALS — BP 134/70 | HR 84 | Ht 63.0 in | Wt 140.4 lb

## 2014-09-09 DIAGNOSIS — I2510 Atherosclerotic heart disease of native coronary artery without angina pectoris: Secondary | ICD-10-CM | POA: Diagnosis not present

## 2014-09-09 DIAGNOSIS — R05 Cough: Secondary | ICD-10-CM

## 2014-09-09 DIAGNOSIS — R058 Other specified cough: Secondary | ICD-10-CM

## 2014-09-09 MED ORDER — AMOXICILLIN-POT CLAVULANATE 875-125 MG PO TABS: ORAL_TABLET | ORAL | Status: DC

## 2014-09-09 NOTE — Progress Notes (Signed)
Subjective:    Patient ID: Erin Hunt, female    DOB: 11/13/1928     MRN: [id number]    Brief patient profile:  41 yowf never smoker new onset cough mid 2005 almost completely resolves p abx and prednisone/ on allergy shots x 2015 from [place] but stopped 07/2014 referred back to pulmonary clinic 07/29/2014 by Dr Kahn/ Erin Hunt.   History of Present Illness  07/29/2014 1st [facility] office visit/ [facility]   Chief Complaint  Patient presents with  . Pulmonary Consult    Referred by Beverlyn Roux. Pt c/o cough and hoarsenss for the past 6-8 months. Cough is esp worse at night and is prod at times with clear sputum.   Always better p  abx plus prednisone but last round prednisone by itself  better only for a few days and mucus better but stayed yellow  Clearing throat even p prednisone  Taking nexium before bfast and cough  tends to be worse before supper and extends until hs / feels first thing in am  rec Nexium Take 30- 60 min before your first and last meals of the day and add pepcid ac 20 mg at bedtime GERD   Please see patient coordinator before you leave today  to schedule Dg Esophagram  When cough mucinex dm up to 1200 mg every 12 hours and use flutter valve valve as much as possible  ? If singulair ever tried? > A yes, no better     09/09/2014 f/u ov/[facility] re:  Chief Complaint  Patient presents with  . Follow-up    Pt c/o non-prod cough, PND, and hoarseness x 7 days. Pt states that after last visit cough was resolved but came back last week   cough is worse after supper and takes zyrtec hs                       Objective:   Physical Exam   amb wf nad  But very hoarse   09/09/2014        140  Wt Readings from Last 3 Encounters:  07/29/14 139 lb (63.05 kg)  06/25/14 139 lb 4 oz (63.163 kg)  06/05/14 140 lb 1.6 oz (63.549 kg)    Vital signs reviewed  HEENT: nl dentition, turbinates, and orophanx. Nl external ear canals without cough reflex   NECK :   without JVD/Nodes/TM/ nl carotid upstrokes bilaterally   LUNGS: no acc muscle use, clear to A and P bilaterally without cough on insp or exp maneuvers   CV:  RRR  no s3 or murmur or increase in P2, no edema   ABD:  soft and nontender with nl excursion in the supine position. No bruits or organomegaly, bowel sounds nl  MS:  warm without deformities, calf tenderness, cyanosis or clubbing  SKIN: warm and dry without lesions    NEURO:  alert, approp, no deficits     I personally reviewed images and agree with radiology impression as follows:  CXR:  07/10/14 No active cardiopulmonary disease.   EG es 08/05/14 1. Nonspecific esophageal motility disorder with multiple tertiary contractions. 2. Otherwise, normal esophagram.  Sinus ct 09/09/2014  There is fluid in both maxillary sinuses right more than left consistent with bilateral maxillary sinusitis. However the ethmoid air cells are well aerated, as are the frontal sinus and the sphenoid sinus. No significant mucosal thickening is seen. No bony abnormality is seen.         Assessment &  Plan:

## 2014-09-09 NOTE — Patient Instructions (Addendum)
Please see patient coordinator before you leave today  to schedule sinus CT  Stop zyrtec and try For drainage take chlortrimeton (chlorpheniramine) 4 mg every 4 hours available over the counter (may cause drowsiness)   Best cough medication is deslym 2 tsp every 12 hours as needed   If not satisfied the next step in process :  See Tammy NP w/in 2 weeks with all your medications, even over the counter meds, separated in two separate bags, the ones you take no matter what vs the ones you stop once you feel better and take only as needed when you feel you need them.   Tammy  will generate for you a new user friendly medication calendar that will put [place] all on the same page re: your medication use.     Without this process, it simply isn't possible to assure that we are providing  your outpatient care  with  the attention to detail we feel you deserve.   If we cannot assure that you're getting that kind of care,  then we cannot manage your problem effectively from this clinic.  Once you have seen Tammy and we are sure that we're all on the same page with your medication use she will arrange follow up with me.  Late add: see CT sinus Pos a/f levels > rx 20 d augmentin / ent eval if not cleared

## 2014-09-10 ENCOUNTER — Telehealth: Payer: Self-pay | Admitting: Internal Medicine

## 2014-09-10 NOTE — Telephone Encounter (Signed)
Yes rx x 20 days planned at one bid = 40 pills, sorry to confuse but I didn't change the default on it and should have

## 2014-09-10 NOTE — Telephone Encounter (Signed)
Spoke with pt. She is aware of MW's response. Pt verbalized understanding. Nothing further was needed.

## 2014-09-10 NOTE — Telephone Encounter (Signed)
Called spoke with patient. She reports MW sent in ABX yesterday for augmentin BID x 10 days but #40 tablets were sent in. The pharm advised pt to call us to make sure she needs this many tablets. Although the pharm did fill her RX. She wants to know how many days she needs to be on the ABX and if only 10 days then what to do with the other pills?

## 2014-09-13 ENCOUNTER — Encounter: Payer: Self-pay | Admitting: Internal Medicine

## 2014-09-13 NOTE — Assessment & Plan Note (Signed)
-   max gerd diet/ ppi and h2 hs 07/29/2014 >>>  - Dg Es 08/05/2014 >  .  1. Nonspecific esophageal motility disorder with multiple tertiary contractions. 2. Otherwise, normal esophagram - stop zyrtec and try 1st gen H1 - Sinus CT 09/09/2014 There is fluid in both maxillary sinuses right more than left consistent with bilateral maxillary sinusitis. However the ethmoid air cells are well aerated, as are the frontal sinus and the sphenoid sinus. rec augmentin x20 days then ov at 3 weeks   Classic Upper airway cough syndrome, so named because it's frequently impossible to sort out how much is  CR/sinusitis with freq throat clearing (which can be related to primary GERD)   vs  causing  secondary (" extra esophageal")  GERD from wide swings in gastric pressure that occur with throat clearing, often  promoting self use of mint and menthol lozenges that reduce the lower esophageal sphincter tone and exacerbate the problem further in a cyclical fashion.   These are the same pts (now being labeled as having "irritable larynx syndrome" by some cough centers) who not infrequently have a history of having failed to tolerate ace inhibitors,  dry powder inhalers or biphosphonates or report having atypical reflux symptoms that don't respond to standard doses of PPI , and are easily confused as having aecopd or asthma flares by even experienced allergists/ pulmonologists.   This episode clearly has been triggered by acute /chronic sinusitis but because of cyclical nature of cough need to keep treating gerd until th ecough is gone.   I had an extended discussion with the patient reviewing all relevant studies completed to date and  lasting 15 to 20 minutes of a 25 minute visit    Each maintenance medication was reviewed in detail including most importantly the difference between maintenance and prns and under what circumstances the prns are to be triggered using an action plan format that is not reflected in the  computer generated alphabetically organized AVS.    Please see instructions for details which were reviewed in writing and the patient given a copy highlighting the part that I personally wrote and discussed at today's ov.

## 2014-10-01 ENCOUNTER — Encounter: Payer: Federal, State, Local not specified - PPO | Admitting: Adult Health

## 2014-10-06 ENCOUNTER — Telehealth: Payer: Self-pay

## 2014-10-06 NOTE — Telephone Encounter (Signed)
Pt states she is feeling really tired, having weak feelings, achy, feels like something in her throat. Having "funny feelings in my chest", yesterday was some pain, none today.

## 2014-10-07 NOTE — Telephone Encounter (Signed)
S/w pt who states she had a "rough weekend". Reports she felt like something stuck in her throat, indigestion, and weak. Says she had to sit most of Sat and Sunday.  Takes Nexium BID. Reports indigestion improved with medication. States her BP was "fine" but could not provide readings. Reports feeling better starting yesterday afternoon and today. She has an appointment with Dr. Marlene Lard in [facility] for her throat issues. Denies CP, nausea, sweating, left arm pain. Advised pt to continue to monitor and report to [place] if increased weakness and if chest pain, go immediately to the ER. Pt verbalized understanding with no further questions.

## 2014-10-14 ENCOUNTER — Encounter: Payer: Self-pay | Admitting: Adult Health

## 2014-10-14 ENCOUNTER — Ambulatory Visit (INDEPENDENT_AMBULATORY_CARE_PROVIDER_SITE_OTHER): Payer: Medicare Other | Admitting: Adult Health

## 2014-10-14 ENCOUNTER — Telehealth: Payer: Self-pay | Admitting: Adult Health

## 2014-10-14 VITALS — BP 108/70 | HR 79 | Temp 97.2°F | Ht 63.0 in | Wt 138.0 lb

## 2014-10-14 DIAGNOSIS — J309 Allergic rhinitis, unspecified: Secondary | ICD-10-CM

## 2014-10-14 DIAGNOSIS — R059 Cough, unspecified: Secondary | ICD-10-CM

## 2014-10-14 DIAGNOSIS — R05 Cough: Secondary | ICD-10-CM | POA: Diagnosis not present

## 2014-10-14 NOTE — Patient Instructions (Signed)
Follow med calendar closely and bring to each visit.  Add Delsym 2 tsp Twice daily  As needed  Cough  Increase Chlorphenramine 2 tabs At bedtime   Use sips of water to avoid throat clearing  Call back with ENT name for referral  Follow up Dr. Melvyn Novas  In 6-8 weeks and As needed   Please contact office for sooner follow up if symptoms do not improve or worsen or seek emergency care

## 2014-10-14 NOTE — Progress Notes (Signed)
Subjective:    Patient ID: Erin Hunt, female    DOB: 20-Jan-1929     MRN: [id number]    Brief patient profile:  20 yowf never smoker new onset cough mid 2005 almost completely resolves p abx and prednisone/ on allergy shots x 2015 from [place] but stopped 07/2014 referred back to pulmonary clinic 07/29/2014 by Dr Kahn/ [facility].   History of Present Illness  07/29/2014 1st [facility] office visit/ [facility]   Chief Complaint  Patient presents with  . Pulmonary Consult    Referred by Beverlyn Roux. Pt c/o cough and hoarsenss for the past 6-8 months. Cough is esp worse at night and is prod at times with clear sputum.   Always better p  abx plus prednisone but last round prednisone by itself  better only for a few days and mucus better but stayed yellow  Clearing throat even p prednisone  Taking nexium before bfast and cough  tends to be worse before supper and extends until hs / feels first thing in am  rec Nexium Take 30- 60 min before your first and last meals of the day and add pepcid ac 20 mg at bedtime GERD   Please see patient coordinator before you leave today  to schedule Dg Esophagram  When cough mucinex dm up to 1200 mg every 12 hours and use flutter valve valve as much as possible  ? If singulair ever tried? > A yes, no better     09/09/2014 f/u ov/[facility] re:  Chief Complaint  Patient presents with  . Follow-up    Pt c/o non-prod cough, PND, and hoarseness x 7 days. Pt states that after last visit cough was resolved but came back last week   cough is worse after supper and takes zyrtec hs  >CT sinus + , tx w/ abx x21 d   10/14/2014 Follow up : Chronic Cough  Pt returns for 1 month follow up  Seen last visit for ongoing chronic cough . CT sinus showed  +sinusitis  She was treated with 21 days of Augmentin  She is having cough and congestion . Does not feel a lot better.  Has a lot of thick drainage in back of throat. Feels main issue is drainage in throat  Uses  chlor tab 4mg   At bedtime   Has seen ENT in past says told her vocal cords were irritated?  Does not remember their name but  Will call back with info .  According to Dr. Gwenette Greet notes was seen at ENT in [place], exam showed mild vocal cord bowing with recommendations for speech therapy .  This cough has been going on for near 10 years .   She has brought all her meds in for review .  We reviewed all her meds and organized them into a med calendar w/ pt educaiton  Appears she is taking her meds correctly . Uses delsym As needed    Denies chest pain, orthopnea, edema or fever.        ROS  Constitutional:   No  weight loss, night sweats,  Fevers, chills, fatigue, or  lassitude.  HEENT:   No headaches,  Difficulty swallowing,  Tooth/dental problems, or  Sore throat,                No sneezing, itching, ear ache,  +nasal congestion, post nasal drip,   CV:  No chest pain,  Orthopnea, PND, swelling in lower extremities, anasarca, dizziness, palpitations, syncope.  GI  No heartburn, indigestion, abdominal pain, nausea, vomiting, diarrhea, change in bowel habits, loss of appetite, bloody stools.   Resp:  .  No chest wall deformity  Skin: no rash or lesions.  GU: no dysuria, change in color of urine, no urgency or frequency.  No flank pain, no hematuria   MS:  No joint pain or swelling.  No decreased range of motion.  No back pain.  Psych:  No change in mood or affect. No depression or anxiety.  No memory loss.            Objective:   Physical Exam   amb wf nad  But very hoarse   09/09/2014        140>>138 10/14/2014    Vital signs reviewed  HEENT: nl dentition, turbinates, and orophanx. Nl external ear canals without cough reflex   NECK :  without JVD/Nodes/TM/ nl carotid upstrokes bilaterally   LUNGS: no acc muscle use, clear to A and P bilaterally without cough on insp or exp maneuvers   CV:  RRR  no s3 or murmur or increase in P2, no edema   ABD:  soft and  nontender with nl excursion in the supine position. No bruits or organomegaly, bowel sounds nl  MS:  warm without deformities, calf tenderness, cyanosis or clubbing  SKIN: warm and dry without lesions    NEURO:  alert, approp, no deficits       CXR:  07/10/14 No active cardiopulmonary disease.   EG es 08/05/14 1. Nonspecific esophageal motility disorder with multiple tertiary contractions. 2. Otherwise, normal esophagram.  Sinus ct 09/09/2014  There is fluid in both maxillary sinuses right more than left consistent with bilateral maxillary sinusitis. However the ethmoid air cells are well aerated, as are the frontal sinus and the sphenoid sinus. No significant mucosal thickening is seen. No bony abnormality is seen.         Assessment & Plan:

## 2014-10-14 NOTE — Telephone Encounter (Signed)
Called and spoke to pt's daughter, Langley Gauss. Langley Gauss stated the pt sees Dr. Beverly Gust in [place], phone #: [phone].  TP, please advise if you would like a referral to this MD. Thanks.    Patient Instructions     Follow med calendar closely and bring to each visit.  Add Delsym 2 tsp Twice daily As needed Cough  Increase Chlorphenramine 2 tabs At bedtime  Use sips of water to avoid throat clearing  Call back with ENT name for referral  Follow up Dr. Melvyn Novas In 6-8 weeks and As needed  Please contact office for sooner follow up if symptoms do not improve or worsen or seek emergency care

## 2014-10-14 NOTE — Assessment & Plan Note (Addendum)
Cyclical cough with recent acute sinusitis noted on CT sinus  No sign improvement after abx and cough regimen aimed at [facility] /GERD prevention  Will tx w/ aggressive AR /GERD control  Patient's medications were reviewed today and patient education was given. Computerized medication calendar was adjusted/completed  Send back to  to ENT for evaluation  Consider repeat PFT  In future if not improving .    Plan  Follow med calendar closely and bring to each visit.  Add Delsym 2 tsp Twice daily  As needed  Cough  Increase Chlorphenramine 2 tabs At bedtime   Use sips of water to avoid throat clearing  Call back with ENT name for referral  Follow up Dr. Melvyn Novas  In 6-8 weeks and As needed   Please contact office for sooner follow up if symptoms do not improve or worsen or seek emergency care

## 2014-10-14 NOTE — Addendum Note (Signed)
Addended by: Osa Craver on: 10/14/2014 03:38 PM   Modules accepted: Orders, Medications

## 2014-10-14 NOTE — Progress Notes (Signed)
Chart and office note reviewed in detail along with available xrays/ labs > agree with a/p as outlined  

## 2014-10-14 NOTE — Assessment & Plan Note (Signed)
Acute Sinusitis on recent CT sinus with 3 weeks of abx  Exam is unrevealing but does not feel cough has improved , will send back to ENT for evaluation   Plan  Add Delsym 2 tsp Twice daily  As needed  Cough  Increase Chlorphenramine 2 tabs At bedtime   Use sips of water to avoid throat clearing  Call back with ENT name for referral  Follow up Dr. Melvyn Novas  In 6-8 weeks and As needed   Please contact office for sooner follow up if symptoms do not improve or worsen or seek emergency care

## 2014-10-17 NOTE — Telephone Encounter (Signed)
TP - please advise. Thanks. 

## 2014-10-17 NOTE — Telephone Encounter (Signed)
Yes please send referral

## 2014-10-17 NOTE — Telephone Encounter (Signed)
Referral has been placed. Nothing further needed.   

## 2014-10-28 DIAGNOSIS — J310 Chronic rhinitis: Secondary | ICD-10-CM | POA: Insufficient documentation

## 2014-11-24 NOTE — Progress Notes (Signed)
Patient Care Team: Perrin Maltese, MD as PCP - General (Internal Medicine)   HPI  Erin Hunt is a 81 y.o. female Seen in followup of orthostatic hypotension and systolic hypertension.  Seen 7/16 and discussed non pharmacological Rx, incl blocks isometric contraction, abdominal binders, and fluids   She is much improved. She is wearing the abdominal binder. The last few weeks she's had some more presyncopal spells but no syncope. Her urine is yellow and malodorous. She does take vitamins. According to her daughter who is a Marine scientist, she has had problems with hyponatremia. Possibly dilutional  The use a "electrolyte replacement package" with a glass of water to help ameliorate her symptoms of lightheadedness..  Echocardiogram 3/15 was reviewed. DATE TEST    3/15 echo   EF normal   mild AS           Past Medical History  Diagnosis Date  . Anemia   . IgG gammopathy   . Arthritis   . Diverticulosis   . GERD (gastroesophageal reflux disease)   . Allergic rhinitis   . IBS (irritable bowel syndrome)   . Fibromyalgia   . Carpal tunnel syndrome   . Cervical spine degeneration   . CAD (coronary artery disease)   . Hypertension   . Vitamin B12 deficiency   . LBBB (left bundle branch block)   . PONV (postoperative nausea and vomiting)     Past Surgical History  Procedure Laterality Date  . Tsa    . Vesicovaginal fistula closure w/ tah    . Cardiac catheterization    . Cholecystectomy    . Breast lumpectomy with radioactive seed localization Left 06/25/2014    Procedure: LEFT BREAST LUMPECTOMY WITH RADIOACTIVE SEED LOCALIZATION;  Surgeon: Coralie Keens, MD;  Location: [facility];  Service: General;  Laterality: Left;    Current Outpatient Prescriptions  Medication Sig Dispense Refill  . amLODipine (NORVASC) 5 MG tablet TAKE 1 TABLET DAILY (Patient taking differently: TAKE 1 TABLET BY MOUTH EVERY MORNING) 90 tablet 3  . b complex vitamins tablet Take 1  tablet by mouth daily.    [name] BIOTIN PO Take 1,000 mg by mouth daily at 12 noon.     . celecoxib (CELEBREX) 200 MG capsule Take 200 mg by mouth as needed (joint pain).     . Coenzyme Q10 (COQ10 PO) Take 1 tablet by mouth daily at 12 noon.    . cyclobenzaprine (FLEXERIL) 10 MG tablet Take 10 mg by mouth as needed.     [name] esomeprazole (NEXIUM) 40 MG capsule Take 1 capsule (40 mg total) by mouth 2 (two) times daily before a meal. (Patient taking differently: Take 40 mg by mouth daily. ) 60 capsule 11  . estropipate (OGEN) 1.5 MG tablet Take 1.5 mg by mouth every morning.     . famotidine (PEPCID) 20 MG tablet One at bedtime    . hyoscyamine (LEVSIN SL) 0.125 MG SL tablet Take 2 tablets by mouth twice daily as needed for stomach cramps    . magnesium gluconate (MAGONATE) 500 MG tablet Take 500 mg by mouth daily.     . Melatonin 1 MG TABS Take by mouth. Use as needed per bottle for insomnia    . NON FORMULARY Ultima electrolyte powder Take 1 tsp by mouth three times a day    . Probiotic Product (ALIGN PO) Take 1 tablet by mouth at bedtime.    . valsartan (DIOVAN) 160 MG tablet Take  1 tablet (160 mg total) by mouth daily. (Patient taking differently: Take 160 mg by mouth every morning. ) 5 tablet 0  . chlorpheniramine (CHLOR-TRIMETON) 4 MG tablet Take 1 tablet by mouth every 4 hours as needed for drainage     No current facility-administered medications for this visit.    Allergies  Allergen Reactions  . Aspirin Other (See Comments)    Ecchymosis with high doses  . Codeine Sulfate Nausea Only  . Erythromycin Nausea And Vomiting  . Sulfa Antibiotics Nausea Only  . Sulfonamide Derivatives Nausea Only      Review of Systems negative except from HPI and PMH  Physical Exam BP 157/79 mmHg  Pulse 82  Ht 5\' 3"  (1.6 m)  Wt 139 lb 4 oz (63.163 kg)  BMI 24.67 kg/m2 Well developed and well nourished in no acute distress HENT normal E scleral and icterus clear Neck Supple JVP flat; carotids  brisk and full Clear to ausculation  Regular rate and rhythm, 3/6 systolic murmur Soft with active bowel sounds No clubbing cyanosis  Edema Alert and oriented, grossly normal motor and sensory function Skin Warm and Dry    Assessment and  Plan  Hypertrension  Orthostatic hypotension  Aortic stenosis-mild  Hyponatremia   the patient's blood pressure remains an issue. [place] orthostasis. Her urine color and odor may be related to vitamins or may represent intravascular dehydration. We will have her stop her vitamins for a few weeks. Given her history of recurrent hyponatremia albeit borderline, we will check at that point a urine specific gravity, urine and serum osmolality and sodium to make sure there is no evidence of SIADH. This will also give [place] a gauge as to whether she is adequately volume repleted.  We have discussed the potential use of Mestinon as Augmented therapy for her orthostatic hypotension in the setting of her significant systolic hypertension.  I should note that her daughters are quite attuned  to her health issues. They feel like they have a good understanding of the role of sodium replacement and the use of   electrolyte solution

## 2014-11-25 ENCOUNTER — Ambulatory Visit (INDEPENDENT_AMBULATORY_CARE_PROVIDER_SITE_OTHER): Payer: Medicare Other | Admitting: Internal Medicine

## 2014-11-25 ENCOUNTER — Encounter: Payer: Self-pay | Admitting: Internal Medicine

## 2014-11-25 VITALS — BP 157/79 | HR 82 | Ht 63.0 in | Wt 139.2 lb

## 2014-11-25 DIAGNOSIS — I2510 Atherosclerotic heart disease of native coronary artery without angina pectoris: Secondary | ICD-10-CM

## 2014-11-25 DIAGNOSIS — E871 Hypo-osmolality and hyponatremia: Secondary | ICD-10-CM | POA: Diagnosis not present

## 2014-11-25 NOTE — Patient Instructions (Signed)
Medication Instructions: - Hold all vitamins that you are taking for the next 3 weeks.  Labwork: - Your physician recommends that you return for lab work in: 3 weeks- BMP/ serum osomlality/ urine sodium/ urine osmolality/ urinalysis  Procedures/Testing: - none  Follow-Up: - Your physician recommends that you schedule a follow-up appointment in: 3 months with Dr. Caryl Comes.  Any Additional Special Instructions Will Be Listed Below (If Applicable).

## 2014-11-26 ENCOUNTER — Ambulatory Visit (INDEPENDENT_AMBULATORY_CARE_PROVIDER_SITE_OTHER): Payer: Medicare Other | Admitting: Internal Medicine

## 2014-11-26 ENCOUNTER — Other Ambulatory Visit (INDEPENDENT_AMBULATORY_CARE_PROVIDER_SITE_OTHER): Payer: Medicare Other

## 2014-11-26 ENCOUNTER — Encounter: Payer: Self-pay | Admitting: Internal Medicine

## 2014-11-26 DIAGNOSIS — R05 Cough: Secondary | ICD-10-CM | POA: Diagnosis not present

## 2014-11-26 DIAGNOSIS — R058 Other specified cough: Secondary | ICD-10-CM

## 2014-11-26 DIAGNOSIS — I2510 Atherosclerotic heart disease of native coronary artery without angina pectoris: Secondary | ICD-10-CM

## 2014-11-26 LAB — CBC WITH DIFFERENTIAL/PLATELET
Basophils Absolute: 0.1 10*3/uL (ref 0.0–0.1)
Basophils Relative: 0.7 % (ref 0.0–3.0)
EOS PCT: 1.4 % (ref 0.0–5.0)
Eosinophils Absolute: 0.1 10*3/uL (ref 0.0–0.7)
HCT: 41.5 % (ref 36.0–46.0)
HEMOGLOBIN: 13.8 g/dL (ref 12.0–15.0)
LYMPHS ABS: 2.3 10*3/uL (ref 0.7–4.0)
Lymphocytes Relative: 28.2 % (ref 12.0–46.0)
MCHC: 33.3 g/dL (ref 30.0–36.0)
MCV: 84.6 fl (ref 78.0–100.0)
MONOS PCT: 6.2 % (ref 3.0–12.0)
Monocytes Absolute: 0.5 10*3/uL (ref 0.1–1.0)
Neutro Abs: 5.2 10*3/uL (ref 1.4–7.7)
Neutrophils Relative %: 63.5 % (ref 43.0–77.0)
Platelets: 325 10*3/uL (ref 150.0–400.0)
RBC: 4.9 Mil/uL (ref 3.87–5.11)
RDW: 14 % (ref 11.5–15.5)
WBC: 8.2 10*3/uL (ref 4.0–10.5)

## 2014-11-26 LAB — SEDIMENTATION RATE: Sed Rate: 23 mm/hr — ABNORMAL HIGH (ref 0–22)

## 2014-11-26 MED ORDER — AMITRIPTYLINE HCL 10 MG PO TABS: 10.0000 mg | ORAL_TABLET | Freq: Every day | ORAL | Status: DC

## 2014-11-26 MED ORDER — PREDNISONE 10 MG PO TABS: ORAL_TABLET | ORAL | Status: DC

## 2014-11-26 NOTE — Patient Instructions (Addendum)
Add amitryptilline 10 mg at bedtime  Stop all oil based vitamins until next visit and keep the candy handy to prevent excess throat clearing   Prednisone 10 mg take  4 each am x 2 days,   2 each am x 2 days,  1 each am x 2 days and stop  Please remember to go to the lab  department downstairs for your tests - we will call you with the results when they are available.  Please schedule a follow up office visit in 6 weeks, call sooner if needed

## 2014-11-26 NOTE — Assessment & Plan Note (Signed)
-   max gerd diet/ ppi and h2 hs 07/29/2014 >>>  - Dg Es 08/05/2014 >  .  1. Nonspecific esophageal motility disorder with multiple tertiary contractions. 2. Otherwise, normal esophagram - stop zyrtec and try 1st gen H1 - Sinus CT 09/09/2014 There is fluid in both maxillary sinuses right more than left consistent with bilateral maxillary sinusitis. However the ethmoid air cells are well aerated, as are the frontal sinus and the sphenoid sinus. rec augmentin x20 days then ov at 3 weeks> ent eval > changed to atrovent ns 0.6 and no better - added elavil 10 mg qhs 11/26/2014 >>>  - allergy profile 11/26/2014 >>>    No better with ent eval and rx but reports always better p pred ? eos bronchitis/rhinitis or element of cough variant asthma  Will try only short course pred then resume chronic rx for pnds/ irritable larynx (keeping in mind these different mech for cough are not at all mutually exclusive) with first hs elavil per guidelines and consider add neurontin next     I had an extended discussion with the patient and daughter  reviewing all relevant studies completed to date and  lasting 15 to 20 minutes of a 25 minute visit    Each maintenance medication was reviewed in detail including most importantly the difference between maintenance and prns and under what circumstances the prns are to be triggered using an action plan format that is not reflected in the computer generated alphabetically organized AVS but trather by a customized med calendar that reflects the AVS meds with confirmed 100% correlation.   Please see instructions for details which were reviewed in writing and the patient given a copy highlighting the part that I personally wrote and discussed at today's ov.

## 2014-11-26 NOTE — Progress Notes (Signed)
Subjective:    Patient ID: Erin Hunt, female    DOB: 20-Dec-1928     MRN: [id number]    Brief patient profile:  89 yowf never smoker new onset cough mid 2005 almost completely resolves p abx and prednisone/ on allergy shots x 2015 from [place] but stopped 07/2014 referred back to pulmonary clinic 07/29/2014 by Dr Kahn/ [facility].   History of Present Illness  07/29/2014 1st [facility] office visit/ Kateri Balch   Chief Complaint  Patient presents with  . Pulmonary Consult    Referred by Beverlyn Roux. Pt c/o cough and hoarsenss for the past 6-8 months. Cough is esp worse at night and is prod at times with clear sputum.   Always better p  abx plus prednisone but last round prednisone by itself  better only for a few days and mucus better but stayed yellow  Clearing throat even p prednisone  Taking nexium before bfast and cough  tends to be worse before supper and extends until hs / feels first thing in am  rec Nexium Take 30- 60 min before your first and last meals of the day and add pepcid ac 20 mg at bedtime GERD   Please see patient coordinator before you leave today  to schedule Dg Esophagram  When cough mucinex dm up to 1200 mg every 12 hours and use flutter valve valve as much as possible  ? If singulair ever tried? > A yes, no better     09/09/2014 f/u ov/Diontae Route re:  Chief Complaint  Patient presents with  . Follow-up    Pt c/o non-prod cough, PND, and hoarseness x 7 days. Pt states that after last visit cough was resolved but came back last week   cough is worse after supper and takes zyrtec hs  >CT sinus + , tx w/ abx x21 d   10/14/2014 Follow up : Chronic Cough  Pt returns for 1 month follow up  Seen last visit for ongoing chronic cough . CT sinus showed  +sinusitis  She was treated with 21 days of Augmentin  She is having cough and congestion . Does not feel a lot better.  Has a lot of thick drainage in back of throat. Feels main issue is drainage in throat  Uses  chlor tab 38m  At bedtime   Has seen ENT in past says told her vocal cords were irritated?  Does not remember their name but  Will call back with info .  According to Dr. CGwenette Greetnotes was seen at [place]NT in [place] exam showed mild vocal cord bowing with recommendations for speech therapy .  This cough has been going on for near 10 years .  She has brought all her meds in for review .  We reviewed all her meds and organized them into a med calendar w/ pt educaiton  Appears she is taking her meds correctly . Uses delsym As needed     11/26/2014  f/u ov/Paz Fuentes re: uacs/ cr/sinusitis Chief Complaint  Patient presents with  . Follow-up    Pt states her cough has slightly improved. She still c/o PND and hoarseness. Occ will produce yellow sputum in the am.   worst symptoms after supper and before bedtime but also does wake her up and each am feels light throat full of mucus but ent eval "neg" per pt despite pos CT sinus noted  Does  Best on pred/ not abx   No obvious other factors in apparent  day  to day or daytime variability or assoc sob or cp or chest tightness, subjective wheeze or overt  hb symptoms. No unusual exp hx or h/o childhood pna/ asthma or knowledge of premature birth.  Sleeping ok without nocturnal  or early am exacerbation  of respiratory  c/o's or need for noct saba. Also denies any obvious fluctuation of symptoms with weather or environmental changes or other aggravating or alleviating factors except as outlined above   Current Medications, Allergies, Complete Past Medical History, Past Surgical History, Family History, and Social History were reviewed in [employer] record.  ROS  The following are not active complaints unless bolded sore throat, dysphagia, dental problems, itching, sneezing,  nasal congestion or excess/ purulent secretions, ear ache,   fever, chills, sweats, unintended wt loss, classically pleuritic or exertional cp, hemoptysis,  orthopnea  pnd or leg swelling, presyncope, palpitations, abdominal pain, anorexia, nausea, vomiting, diarrhea  or change in bowel or bladder habits, change in stools or urine, dysuria,hematuria,  rash, arthralgias, visual complaints, headache, numbness, weakness or ataxia or problems with walking or coordination,  change in mood/affect or memory.              Objective:   Physical Exam   amb wf nad     09/09/2014 140  > 138 10/14/2014 >  11/26/2014  141   Vital signs reviewed  HEENT: nl dentition, turbinates, and orophanx. Nl external ear canals without cough reflex   NECK :  without JVD/Nodes/TM/ nl carotid upstrokes bilaterally   LUNGS: no acc muscle use,  without cough on insp or exp maneuvers - a few end insp crackles bilaterally    CV:  RRR  no s3 or murmur or increase in P2, no edema   ABD:  soft and nontender with nl excursion in the supine position. No bruits or organomegaly, bowel sounds nl  MS:  warm without deformities, calf tenderness, cyanosis or clubbing  SKIN: warm and dry without lesions    NEURO:  alert, approp, no deficits       CXR:  07/10/14 No active cardiopulmonary disease.     EG es 08/05/14 1. Nonspecific esophageal motility disorder with multiple tertiary contractions. 2. Otherwise, normal esophagram.    Sinus ct 09/09/2014  There is fluid in both maxillary sinuses right more than left consistent with bilateral maxillary sinusitis. However the ethmoid air cells are well aerated, as are the frontal sinus and the sphenoid sinus. No significant mucosal thickening is seen. No bony abnormality is seen.    Labs 11/26/2014 allergy profile/esr     Assessment & Plan:

## 2014-11-27 LAB — ALLERGY FULL PROFILE
Allergen, D pternoyssinus,d7: 0.21 kU/L — ABNORMAL HIGH
Bahia Grass: <0.1 kU/L
Bermuda Grass: <0.1 kU/L
Box Elder IgE: <0.1 kU/L
Candida Albicans: <0.1 kU/L
Cat Dander: <0.1 kU/L
Curvularia lunata: <0.1 kU/L
D. FARINAE: 0.3 kU/L — AB
Dog Dander: <0.1 kU/L
Elm IgE: <0.1 kU/L
Fescue: <0.1 kU/L
G009 Red Top: <0.1 kU/L
IgE (Immunoglobulin E), Serum: 73 kU/L (ref <115)
Lamb's Quarters: <0.1 kU/L
Oak: <0.1 kU/L

## 2014-12-02 ENCOUNTER — Telehealth: Payer: Self-pay | Admitting: Internal Medicine

## 2014-12-02 NOTE — Telephone Encounter (Signed)
Sorry for the delay I must have pushed the wrong button   Allergy to dust only main rx is avoidance No evidence of rheumatism

## 2014-12-02 NOTE — Telephone Encounter (Signed)
Patient calling to get her lab results.  Results are in Erin Hunt.  Dr. Melvyn Novas, please advise.

## 2014-12-02 NOTE — Telephone Encounter (Signed)
Spoke with pt, aware of results and recs.  Nothing further needed.  

## 2014-12-16 ENCOUNTER — Other Ambulatory Visit (INDEPENDENT_AMBULATORY_CARE_PROVIDER_SITE_OTHER): Payer: Medicare Other

## 2014-12-16 DIAGNOSIS — E871 Hypo-osmolality and hyponatremia: Secondary | ICD-10-CM

## 2014-12-17 LAB — BASIC METABOLIC PANEL
BUN / CREAT RATIO: 17 (ref 11–26)
BUN: 16 mg/dL (ref 8–27)
CHLORIDE: 99 mmol/L (ref 97–106)
CO2: 24 mmol/L (ref 18–29)
Calcium: 9.1 mg/dL (ref 8.7–10.3)
Creatinine, Ser: 0.93 mg/dL (ref 0.57–1.00)
GFR calc Af Amer: 65 mL/min/{1.73_m2} (ref >59)
GFR calc non Af Amer: 57 mL/min/{1.73_m2} — ABNORMAL LOW (ref >59)
GLUCOSE: 78 mg/dL (ref 65–99)
Potassium: 5.3 mmol/L — ABNORMAL HIGH (ref 3.5–5.2)
SODIUM: 135 mmol/L — AB (ref 136–144)

## 2014-12-23 ENCOUNTER — Telehealth: Payer: Self-pay | Admitting: Internal Medicine

## 2014-12-23 NOTE — Telephone Encounter (Signed)
Wants to get lab results from last week.

## 2014-12-24 NOTE — Telephone Encounter (Signed)
S/w pt of preliminary BMET results. Pt verbalized understanding that Dr. Caryl Comes has not yet reviewed and I will CB once MD has signed off on them.  Pt appreciative of the call

## 2014-12-24 NOTE — Telephone Encounter (Signed)
Please call patient back

## 2014-12-24 NOTE — Telephone Encounter (Signed)
Left message on machine for patient to contact the office.   

## 2015-01-06 ENCOUNTER — Other Ambulatory Visit: Payer: Self-pay | Admitting: *Deleted

## 2015-01-06 DIAGNOSIS — E875 Hyperkalemia: Secondary | ICD-10-CM

## 2015-01-07 ENCOUNTER — Encounter: Payer: Self-pay | Admitting: Internal Medicine

## 2015-01-07 ENCOUNTER — Ambulatory Visit
Admission: RE | Admit: 2015-01-07 | Discharge: 2015-01-07 | Disposition: A | Discharge status: Home / Self Care | Payer: Medicare Other | Source: Ambulatory Visit | Attending: Internal Medicine | Admitting: Internal Medicine

## 2015-01-07 ENCOUNTER — Ambulatory Visit (INDEPENDENT_AMBULATORY_CARE_PROVIDER_SITE_OTHER): Payer: Medicare Other | Admitting: Internal Medicine

## 2015-01-07 DIAGNOSIS — R05 Cough: Secondary | ICD-10-CM | POA: Diagnosis not present

## 2015-01-07 DIAGNOSIS — I2510 Atherosclerotic heart disease of native coronary artery without angina pectoris: Secondary | ICD-10-CM

## 2015-01-07 DIAGNOSIS — R058 Other specified cough: Secondary | ICD-10-CM

## 2015-01-07 MED ORDER — PREDNISONE 10 MG PO TABS
ORAL_TABLET | ORAL | Status: DC
Start: 2015-01-07 — End: 2015-02-10

## 2015-01-07 MED ORDER — ESOMEPRAZOLE MAGNESIUM 40 MG PO CPDR: 40.0000 mg | DELAYED_RELEASE_CAPSULE | Freq: Every day | ORAL | Status: DC

## 2015-01-07 NOTE — Patient Instructions (Addendum)
Please see patient coordinator before you leave today  to schedule sinus CT   For drainage / throat Wack try take CHLORPHENIRAMINE  4 mg - take one every 4 hours as needed - available over the counter- may cause drowsiness so start with just a bedtime dose x two and see how you tolerate it before trying in daytime    Stop amitryptilline for now   Keep the candy handy   Prednisone 10 Take 4 for two days three for two days two for two days one for two days   Continue all other meds as per your med calendar  Late add:  Sinus CT pos a/f >  rec return to ent or refer to [facility]

## 2015-01-07 NOTE — Progress Notes (Signed)
Subjective:    Patient ID: Erin Hunt, female    DOB: [birth year]     MRN: [id number]    Brief patient profile:  65 yowf never smoker new onset cough mid 2005 almost completely resolves p abx and prednisone/ on allergy shots x 2015 from [place] but stopped 07/2014 referred back to pulmonary clinic 07/29/2014 by Erin Hunt/ Erin Hunt.   History of Present Illness  07/29/2014 1st [facility] office visit/ Erin Hunt   Chief Complaint  Patient presents with  . Pulmonary Consult    Referred by Erin Hunt. Pt c/o cough and hoarsenss for the past 6-8 months. Cough is esp worse at night and is prod at times with clear sputum.   Always better p  abx plus prednisone but last round prednisone by itself  better only for a few days and mucus better but stayed yellow  Clearing throat even p prednisone  Taking nexium before bfast and cough  tends to be worse before supper and extends until hs / feels first thing in am  rec Nexium Take 30- 60 min before your first and last meals of the day and add pepcid ac 20 mg at bedtime GERD   Please see patient coordinator before you leave today  to schedule Dg Esophagram  When cough mucinex dm up to 1200 mg every 12 hours and use flutter valve valve as much as possible  ? If singulair ever tried? > A yes, no better     09/09/2014 f/u ov/Erin Hunt re:  Chief Complaint  Patient presents with  . Follow-up    Pt c/o non-prod cough, PND, and hoarseness x 7 days. Pt states that after last visit cough was resolved but came back last week   cough is worse after supper and takes zyrtec hs  >CT sinus + , tx w/ abx x21 d   10/14/2014 NP   Follow up : Chronic Cough  Pt returns for 1 month follow up  Seen last visit for ongoing chronic cough . CT sinus showed  +sinusitis  She was treated with 21 days of Augmentin  She is having cough and congestion . Does not feel a lot better.  Has a lot of thick drainage in back of throat. Feels main issue is drainage in throat   Uses chlor tab 4mg   At bedtime   Has seen ENT in past says told her vocal cords were irritated?  Does not remember their name but  Will call back with info .  According to Erin Hunt notes was seen at ENT in [place], exam showed mild vocal cord bowing with recommendations for speech therapy .  This cough has been going on for near 10 years .  She has brought all her meds in for review .  We reviewed all her meds and organized them into a med calendar w/ pt educaiton  Appears she is taking her meds correctly . Uses delsym As needed   rec Follow med calendar closely and bring to each visit.  Add Delsym 2 tsp Twice daily  As needed  Cough  Increase Chlorphenramine 2 tabs At bedtime   Use sips of water to avoid throat clearing  Call back with ENT name for referral > Erin Hunt     11/26/2014  f/u ov/Erin Hunt re: uacs/ cr/sinusitis Chief Complaint  Patient presents with  . Follow-up    Pt states her cough has slightly improved. She still c/o PND and hoarseness. Occ will produce yellow sputum in the  am.   worst symptoms after supper and before bedtime but also does wake her up and each am feels light throat full of mucus but ent eval "neg" per pt despite pos CT sinus noted  Does  Best on pred/ not abx rec Add amitryptilline 10 mg at bedtime Stop all oil based vitamins until next visit and keep the candy handy to prevent excess throat clearing  Prednisone 10 mg take  4 each am x 2 days,   2 each am x 2 days,  1 each am x 2 days and stop    01/07/2015  f/u ov/Erin Hunt re: chronic cough c/w uacs  Chief Complaint  Patient presents with  . Follow-up    Pt states that her hoarsenss has bene worse over the past wk. Her cough is no better- still coughing up yellow sputum.   pred worked great while on it and gradually worse p a week off  Cough Worse at bedtime and early in am   No obvious other factors in apparent  day to day or daytime variability or assoc sob or cp or chest tightness, subjective  wheeze or overt  hb symptoms. No unusual exp hx or h/o childhood pna/ asthma or knowledge of premature birth.  Sleeping ok without nocturnal  or early am exacerbation  of respiratory  c/o's or need for noct saba. Also denies any obvious fluctuation of symptoms with weather or environmental changes or other aggravating or alleviating factors except as outlined above   Current Medications, Allergies, Complete Past Medical History, Past Surgical History, Family History, and Social History were reviewed in [employer] record.  ROS  The following are not active complaints unless bolded sore throat, dysphagia, dental problems, itching, sneezing,  nasal congestion or excess/ purulent secretions, ear ache,   fever, chills, sweats, unintended wt loss, classically pleuritic or exertional cp, hemoptysis,  orthopnea pnd or leg swelling, presyncope, palpitations, abdominal pain, anorexia, nausea, vomiting, diarrhea  or change in bowel or bladder habits, change in stools or urine, dysuria,hematuria,  rash, arthralgias, visual complaints, headache, numbness, weakness or ataxia or problems with walking or coordination,  change in mood/affect or memory.              Objective:   Physical Exam   amb wf nad   / nasal tone to voice   09/09/2014 140  > 138 10/14/2014 >  11/26/2014  141> 01/07/2015   139    Vital signs reviewed  HEENT: nl dentition,  and orophanx. Nl external ear canals without cough reflex Mod bilateral non specific turbinate edema   NECK :  without JVD/Nodes/TM/ nl carotid upstrokes bilaterally   LUNGS: no acc muscle use,  without cough on insp or exp maneuvers - a few end insp crackles bilaterally    CV:  RRR  no s3 or murmur or increase in P2, no edema   ABD:  soft and nontender with nl excursion in the supine position. No bruits or organomegaly, bowel sounds nl  MS:  warm without deformities, calf tenderness, cyanosis or clubbing  SKIN: warm and dry without  lesions    NEURO:  alert, approp, no deficits                       Assessment & Plan:

## 2015-01-08 ENCOUNTER — Encounter: Payer: Self-pay | Admitting: Internal Medicine

## 2015-01-08 ENCOUNTER — Other Ambulatory Visit (INDEPENDENT_AMBULATORY_CARE_PROVIDER_SITE_OTHER): Payer: Medicare Other | Admitting: *Deleted

## 2015-01-08 DIAGNOSIS — E875 Hyperkalemia: Secondary | ICD-10-CM | POA: Diagnosis not present

## 2015-01-08 NOTE — Assessment & Plan Note (Addendum)
-   max gerd diet/ ppi and h2 hs 07/29/2014 >>>  - Dg Es 08/05/2014 >  .  1. Nonspecific esophageal motility disorder with multiple tertiary contractions. 2. Otherwise, normal esophagram - stop zyrtec and try 1st gen H1 - Sinus CT 09/09/2014 There is fluid in both maxillary sinuses right more than left consistent with bilateral maxillary sinusitis. However the ethmoid air cells are well aerated, as are the frontal sinus and the sphenoid sinus. rec augmentin x 20 days then ov at 3 weeks> ent eval > changed to atrovent ns 0.6 and no better - added elavil 10 mg qhs 11/26/2014 >>>  - allergy profile 11/26/2014 > Eos 0.1,  IgE 73 Pos RAST dust only - repeat sinus CT 01/07/2015 > bilateral a/f levels max sinuses  > referred back to ENT  Symptoms are classic for uacs secondary to unresolved sinusitis with pnds and therefore nothing else to offer in this clinic until this problem is adequately addressed ? Needs referral to [place]  [facility]/ Dr Joya Gaskins    I had an extended discussion with the patient reviewing all relevant studies completed to date and  lasting 15 to 20 minutes of a 25 minute visit    Each maintenance medication was reviewed in detail including most importantly the difference between maintenance and prns and under what circumstances the prns are to be triggered using an action plan format that is not reflected in the computer generated alphabetically organized AVS but trather by a customized med calendar that reflects the AVS meds with confirmed 100% correlation.   Please see instructions for details which were reviewed in writing and the patient given a copy highlighting the part that I personally wrote and discussed at today's ov.

## 2015-01-09 ENCOUNTER — Telehealth: Payer: Self-pay | Admitting: Internal Medicine

## 2015-01-09 LAB — BASIC METABOLIC PANEL
BUN / CREAT RATIO: 24 (ref 11–26)
BUN: 25 mg/dL (ref 8–27)
CALCIUM: 9.6 mg/dL (ref 8.7–10.3)
CO2: 20 mmol/L (ref 18–29)
Chloride: 96 mmol/L (ref 96–106)
Creatinine, Ser: 1.03 mg/dL — ABNORMAL HIGH (ref 0.57–1.00)
GFR, EST AFRICAN AMERICAN: 58 mL/min/{1.73_m2} — AB (ref >59)
GFR, EST NON AFRICAN AMERICAN: 50 mL/min/{1.73_m2} — AB (ref >59)
Glucose: 143 mg/dL — ABNORMAL HIGH (ref 65–99)
POTASSIUM: 5.5 mmol/L — AB (ref 3.5–5.2)
Sodium: 132 mmol/L — ABNORMAL LOW (ref 134–144)

## 2015-01-09 MED ORDER — AMOXICILLIN-POT CLAVULANATE 875-125 MG PO TABS: 1.0000 | ORAL_TABLET | Freq: Two times a day (BID) | ORAL | Status: DC

## 2015-01-09 NOTE — Telephone Encounter (Signed)
Notes Recorded by Tanda Rockers, MD on 01/08/2015 at 10:59 AM Call patient : Study is c/w severe acute and chronic sinus dz. Options are repeat 20 days of augmentin (which according to records did not work in past) return to her ENT to review this study, or let us send her to [facility] / Dr Joya Gaskins whose specialty is cough related to sinus dz ---  Called spoke with pt. She wants to try the ABX first again before going to see ENT. I have sent this in. nothing further needed

## 2015-01-13 ENCOUNTER — Other Ambulatory Visit: Payer: Self-pay | Admitting: *Deleted

## 2015-01-13 ENCOUNTER — Telehealth: Payer: Self-pay

## 2015-01-13 MED ORDER — AMLODIPINE BESYLATE 10 MG PO TABS: 10.0000 mg | ORAL_TABLET | Freq: Every day | ORAL | Status: DC

## 2015-01-13 NOTE — Telephone Encounter (Signed)
Reviewed 12/15 labs and MD recommendations with pt daughter, Venida Jarvis. Daughter verbalized understanding of medication changes and to f/u w/PCP. She had no further questions

## 2015-01-13 NOTE — Telephone Encounter (Signed)
Pt daughter called, states she would like to know lab results and medication changes. Please. Call.

## 2015-01-23 ENCOUNTER — Telehealth: Payer: Self-pay | Admitting: *Deleted

## 2015-01-23 ENCOUNTER — Telehealth: Payer: Self-pay | Admitting: Internal Medicine

## 2015-01-23 NOTE — Telephone Encounter (Signed)
Per documentation on 01/12/15 lab result the pt's Amlodipine was increased to 10mg  daily by Dr Caryl Comes.  The pt's daughter called because the pt has noticed some swelling in her feet/ankles over the past 2 days.  The pt does follow a very strict diet in regards to sodium and she elevates her legs during the day. The pt's daughter would like to decrease Amlodipine to 5mg  daily to see if her swelling improves and see how her BP responds.  I made her aware that they can try this and if the pt's BP is elevated then we will have to make further recommendations for BP control. The pt's daughter will contact the office if she has further issues with swelling and BP.

## 2015-01-23 NOTE — Telephone Encounter (Signed)
Pt's primary cardiologist is Dr. Tamala Julian in [place].  Looks like pt's daughter also left message there. Will defer to their care, as there is no doctor in our office today.

## 2015-01-23 NOTE — Telephone Encounter (Signed)
Pt c/o swelling: STAT is pt has developed SOB within 24 hours  1. How long have you been experiencing swelling? 2 days  2. Where is the swelling located? Both Feet ankles  3.  Are you currently taking a "fluid pill"? No   4.  Are you currently SOB? no  5.  Have you traveled recently? No   .

## 2015-01-23 NOTE — Telephone Encounter (Signed)
Pt c/o swelling: STAT is pt has developed SOB within 24 hours  1. How long have you been experiencing swelling? 2 days  2. Where is the swelling located? Ankle and feet   3.  Are you currently taking a "fluid pill"? Not sure  4.  Are you currently SOB? No   5.  Have you traveled recently? No    Pt daughter calling but she states she is not with patient, so please call her instead of her. Pt can't hear well.

## 2015-02-10 ENCOUNTER — Encounter: Payer: Self-pay | Admitting: Internal Medicine

## 2015-02-10 ENCOUNTER — Ambulatory Visit (INDEPENDENT_AMBULATORY_CARE_PROVIDER_SITE_OTHER): Payer: Medicare Other | Admitting: Internal Medicine

## 2015-02-10 VITALS — BP 142/70 | HR 91 | Ht 63.0 in | Wt 139.4 lb

## 2015-02-10 DIAGNOSIS — R05 Cough: Secondary | ICD-10-CM | POA: Diagnosis not present

## 2015-02-10 DIAGNOSIS — R058 Other specified cough: Secondary | ICD-10-CM

## 2015-02-10 NOTE — Patient Instructions (Addendum)
Stop nexium  Pepcid 20 mg one after bfast and one after supper   GERD (REFLUX)  is an extremely common cause of respiratory symptoms just like yours , many times with no obvious heartburn at all.    It can be treated with medication, but also with lifestyle changes including elevation of the head of your bed (ideally with 6 inch  bed blocks),  Smoking cessation, avoidance of late meals, excessive alcohol, and avoid fatty foods, chocolate, peppermint, colas, red wine, and acidic juices such as orange juice.  NO MINT OR MENTHOL PRODUCTS SO NO COUGH DROPS  USE SUGARLESS CANDY INSTEAD (Jolley ranchers or Stover's or Life Savers) or even ice chips will also do - the key is to swallow to prevent all throat clearing. NO OIL BASED VITAMINS - use powdered substitutes.   Use flutter valve anytime you are coughing  Please see patient coordinator before you leave today  to schedule Dr Joya Gaskins at [facility] ENT dept with all active medications  Pulmonary follow up is as needed

## 2015-02-10 NOTE — Progress Notes (Signed)
Subjective:    Patient ID: Erin Hunt, female    DOB: October 31, 1928     MRN: [id number]    Brief patient profile:  51 yowf never smoker new onset cough mid 2005 almost completely resolves p abx and prednisone/ on allergy shots x 2015 from [place] but stopped 07/2014 referred back to pulmonary clinic 07/29/2014 by Dr Kahn/ [facility].   History of Present Illness  07/29/2014 1st [facility] office visit/ Erin Hunt   Chief Complaint  Patient presents with  . Pulmonary Consult    Referred by Beverlyn Roux. Pt c/o cough and hoarsenss for the past 6-8 months. Cough is esp worse at night and is prod at times with clear sputum.   Always better p  abx plus prednisone but last round prednisone by itself  better only for a few days and mucus better but stayed yellow  Clearing throat even p prednisone  Taking nexium before bfast and cough  tends to be worse before supper and extends until hs / feels first thing in am lots of congestion  rec Nexium Take 30- 60 min before your first and last meals of the day and add pepcid ac 20 mg at bedtime GERD   Please see patient coordinator before you leave today  to schedule Dg Esophagram  When cough mucinex dm up to 1200 mg every 12 hours and use flutter valve valve as much as possible  ? If singulair ever tried? > A yes, no better     09/09/2014 f/u ov/Erin Hunt re:  Chief Complaint  Patient presents with  . Follow-up    Pt c/o non-prod cough, PND, and hoarseness x 7 days. Pt states that after last visit cough was resolved but came back last week   cough is worse after supper and takes zyrtec hs  >CT sinus + >>>  tx w/ abx x 21 d   10/14/2014 NP   Follow up : Chronic Cough  Pt returns for 1 month follow up  Seen last visit for ongoing chronic cough . CT sinus showed  +sinusitis  She was treated with 21 days of Augmentin  She is having cough and congestion . Does not feel a lot better.  Has a lot of thick drainage in back of throat. Feels main issue is  drainage in throat  Uses chlor tab 4mg   At bedtime   Has seen ENT in past says told her vocal cords were irritated?  Does not remember their name but  Will call back with info .  According to Dr. Gwenette Greet notes was seen at ENT in [place], exam showed mild vocal cord bowing with recommendations for speech therapy .  This cough has been going on for near 10 years .  She has brought all her meds in for review .  We reviewed all her meds and organized them into a med calendar w/ pt educaiton  Appears she is taking her meds correctly . Uses delsym As needed   rec Follow med calendar closely and bring to each visit.  Add Delsym 2 tsp Twice daily  As needed  Cough  Increase Chlorphenramine 2 tabs At bedtime   Use sips of water to avoid throat clearing  Call back with ENT name for referral > [name]     11/26/2014  f/u ov/Erin Hunt re: uacs/ cr/sinusitis Chief Complaint  Patient presents with  . Follow-up    Pt states her cough has slightly improved. She still c/o PND and hoarseness. Occ will  produce yellow sputum in the am.   worst symptoms after supper and before bedtime but also does wake her up and each am feels light throat full of mucus but ent eval "neg" per pt despite pos CT sinus noted  Does  Best on pred/ not abx rec Add amitryptilline 10 mg at bedtime Stop all oil based vitamins until next visit and keep the candy handy to prevent excess throat clearing  Prednisone 10 mg take  4 each am x 2 days,   2 each am x 2 days,  1 each am x 2 days and stop    01/07/2015  f/u ov/Erin Hunt re: chronic cough c/w uacs  Chief Complaint  Patient presents with  . Follow-up    Pt states that her hoarsenss has bene worse over the past wk. Her cough is no better- still coughing up yellow sputum.   pred worked great while on it and gradually worse p a week off  Cough Worse at bedtime and early in am  rec Please see patient coordinator before you leave today  to schedule sinus CT  For drainage / throat  Yusko try take CHLORPHENIRAMINE  4 mg - take one every 4 hours as needed - available over the counter- may cause drowsiness so start with just a bedtime dose x two and see how you tolerate it before trying in daytime  Stop amitryptilline for now  Keep the candy handy  Prednisone 10 Take 4 for two days three for two days two for two days one for two days  Continue all other meds as per your med calendar  Late add:  Sinus CT pos a/f >  rec return to ent or refer to [facility]   02/10/2015  f/u ov/Erin Hunt re: chronic  cough worse for the last year/ ? Better on prednisone / def better on abx but only as long as she's on them  Chief Complaint  Patient presents with  . Follow-up    Pt states that her cough had improved, and then worsened again. She is still hoarse. Cough is prod in the am only with yellow sputum.   cough is much more dry than wet/ more day than noct   No obvious other factors in apparent  day to day or daytime variability or assoc sob or cp or chest tightness, subjective wheeze or overt  hb symptoms. No unusual exp hx or h/o childhood pna/ asthma or knowledge of premature birth.  Sleeping ok without nocturnal  or early am exacerbation  of respiratory  c/o's or need for noct saba. Also denies any obvious fluctuation of symptoms with weather or environmental changes or other aggravating or alleviating factors except as outlined above   Current Medications, Allergies, Complete Past Medical History, Past Surgical History, Family History, and Social History were reviewed in [employer] record.  ROS  The following are not active complaints unless bolded sore throat, dysphagia, dental problems, itching, sneezing,  nasal congestion or excess/ purulent secretions, ear ache,   fever, chills, sweats, unintended wt loss, classically pleuritic or exertional cp, hemoptysis,  orthopnea pnd or leg swelling, presyncope, palpitations, abdominal pain, anorexia, nausea, vomiting, diarrhea   or change in bowel or bladder habits, change in stools or urine, dysuria,hematuria,  rash, arthralgias, visual complaints, headache, numbness, weakness or ataxia or problems with walking or coordination,  change in mood/affect or memory.              Objective:   Physical Exam  amb wf nad   / nasal tone to voice   09/09/2014 140  > 138 10/14/2014 >  11/26/2014  141> 01/07/2015   139    Vital signs reviewed  HEENT: nl dentition,  and orophanx. Nl external ear canals without cough reflex Mod bilateral non specific turbinate edema   NECK :  without JVD/Nodes/TM/ nl carotid upstrokes bilaterally   LUNGS: no acc muscle use,  without cough on insp or exp maneuvers - clear to a and p   CV:  RRR  no s3 or murmur or increase in P2, no edema   ABD:  soft and nontender with nl excursion in the supine position. No bruits or organomegaly, bowel sounds nl  MS:  warm without deformities, calf tenderness, cyanosis or clubbing  SKIN: warm and dry without lesions    NEURO:  alert, approp, no deficits        I personally reviewed images and agree with radiology impression as follows:  CXR:  07/10/14 No active cardiopulmonary disease.                 Assessment & Plan:

## 2015-02-11 ENCOUNTER — Telehealth: Payer: Self-pay

## 2015-02-11 ENCOUNTER — Telehealth: Payer: Self-pay | Admitting: Internal Medicine

## 2015-02-11 NOTE — Telephone Encounter (Signed)
Pt c/o swelling: STAT is pt has developed SOB within 24 hours  1. How long have you been experiencing swelling? For a week  2. Where is the swelling located? Both feet and ankles  3.  Are you currently taking a "fluid pill"? no  4.  Are you currently SOB? no  5.  Have you traveled recently? no

## 2015-02-11 NOTE — Telephone Encounter (Signed)
Patient is currently taking Amlodipine 10mg  daily.  What should she do now?

## 2015-02-11 NOTE — Assessment & Plan Note (Signed)
-   max gerd diet/ ppi and h2 hs 07/29/2014 >>>  - Dg Es 08/05/2014 >  .  1. Nonspecific esophageal motility disorder with multiple tertiary contractions. 2. Otherwise, normal esophagram - stop zyrtec and try 1st gen H1 - Sinus CT 09/09/2014 There is fluid in both maxillary sinuses right more than left consistent with bilateral maxillary sinusitis. However the ethmoid air cells are well aerated, as are the frontal sinus and the sphenoid sinus. rec augmentin x 20 days then ov at 3 weeks> ent eval > changed to atrovent ns 0.6 and no better - added elavil 10 mg qhs 11/26/2014 >>>  - allergy profile 11/26/2014 > Eos 0.1,  IgE 73 Pos RAST dust only - repeat sinus CT 01/07/2015 > bilateral a/f levels max sinuses  > referred back to ENT> no change rx > referred to [facility] for [facility] opinion  I had an extended final summary discussion with the patient reviewing all relevant studies completed to date and  lasting 15 to 20 minutes of a 25 minute visit on the following issues:    Symptoms only responded apparently to antibiotics as long as she is on them and may also respond to short courses of prednisone but again only while she is on them. It was very difficult for me today even in retrospect to tease out which form of therapy seems to help more when used in isolation.  ENT eval in [place] has not been fruitful so it is time to get a second opinion from Dr. Joya Gaskins at [facility] as to the contribution of her sinuses to her upper airway symptoms including hoarseness and already follow-up can be as needed at this point.

## 2015-02-11 NOTE — Telephone Encounter (Signed)
Forward to [facility] as Dr. Caryl Comes in [place] today

## 2015-02-11 NOTE — Telephone Encounter (Signed)
Called pt's daughter.  Says that mom's feet are slightly more swollen now.  B/p remains constant.  Daughter to call back and confirm what dosage and frequency of amlodipine that her mom is taking.

## 2015-02-11 NOTE — Telephone Encounter (Signed)
Pt c/o swelling: STAT is pt has developed SOB within 24 hours  1. How long have you been experiencing swelling? About week   2. Where is the swelling located? Feet ankles  3.  Are you currently taking a "fluid pill"? no  4.  Are you currently SOB? no  5.  Have you traveled recently? No

## 2015-02-12 NOTE — Telephone Encounter (Signed)
Would suggest that she be in touch with her primary care physician to discuss alternative antihypertensive therapy

## 2015-02-12 NOTE — Telephone Encounter (Signed)
Follow up     Calling to see what Dr Caryl Comes said about feet swelling

## 2015-02-12 NOTE — Telephone Encounter (Signed)
I called and spoke with the patient's daughter, [name], and advised her that amlodipine can cause swelling, but per Dr. Caryl Comes, he would like her to touch base with her PCP about switching to a different med. [name] is agreeable.

## 2015-02-26 ENCOUNTER — Ambulatory Visit (INDEPENDENT_AMBULATORY_CARE_PROVIDER_SITE_OTHER): Payer: Medicare Other | Admitting: Internal Medicine

## 2015-02-26 ENCOUNTER — Encounter: Payer: Self-pay | Admitting: Internal Medicine

## 2015-02-26 VITALS — BP 138/70 | HR 89 | Ht 63.0 in | Wt 135.8 lb

## 2015-02-26 DIAGNOSIS — I10 Essential (primary) hypertension: Secondary | ICD-10-CM

## 2015-02-26 DIAGNOSIS — I951 Orthostatic hypotension: Secondary | ICD-10-CM | POA: Diagnosis not present

## 2015-02-26 MED ORDER — ATENOLOL 50 MG PO TABS: ORAL_TABLET | ORAL | Status: DC

## 2015-02-26 NOTE — Patient Instructions (Signed)
Medication Instructions: 1) Take atenolol 50 mg one tablet by mouth once daily at bedtime  Labwork: - none  Procedures/Testing: - none  Follow-Up: - Your physician recommends that you schedule a follow-up appointment in: 3 months with Dr. Caryl Comes.  Any Additional Special Instructions Will Be Listed Below (If Applicable).     If you need a refill on your cardiac medications before your next appointment, please call your pharmacy.

## 2015-02-26 NOTE — Progress Notes (Signed)
Patient Care Team: Perrin Maltese, MD as PCP - General (Internal Medicine)   HPI  Erin Hunt is a 82 y.o. female Seen in followup of orthostatic hypotension and systolic hypertension.  Seen 7/16 and discussed non pharmacological Rx, incl blocks isometric contraction, abdominal binders, and fluids   She iscontinuing with problems. At her last visit she said that she was wearing an abdominal binder; today she is not sure that she ever did.  Head of the bed was raised with blocks; appears from her hand about 8 inches. She slipped down the sheets.  Most recent laboratory work was notable for potassium of 5.5.Her ARB was discontinued and amlodipine was started  Echocardiogram 3/15 was reviewed. DATE TEST    3/15 echo   EF normal   mild AS           Past Medical History  Diagnosis Date  . Anemia   . IgG gammopathy   . Arthritis   . Diverticulosis   . GERD (gastroesophageal reflux disease)   . Allergic rhinitis   . IBS (irritable bowel syndrome)   . Fibromyalgia   . Carpal tunnel syndrome   . Cervical spine degeneration   . CAD (coronary artery disease)   . Hypertension   . Vitamin B12 deficiency   . LBBB (left bundle branch block)   . PONV (postoperative nausea and vomiting)     Past Surgical History  Procedure Laterality Date  . Tsa    . Vesicovaginal fistula closure w/ tah    . Cardiac catheterization    . Cholecystectomy    . Breast lumpectomy with radioactive seed localization Left 06/25/2014    Procedure: LEFT BREAST LUMPECTOMY WITH RADIOACTIVE SEED LOCALIZATION;  Surgeon: Coralie Keens, MD;  Location: [facility];  Service: General;  Laterality: Left;    Current Outpatient Prescriptions  Medication Sig Dispense Refill  . amLODipine (NORVASC) 10 MG tablet Take 1 tablet (10 mg total) by mouth daily. 90 tablet 3  . b complex vitamins tablet Take 1 tablet by mouth daily.    [name] BIOTIN PO Take 1,000 mg by mouth daily at 12 noon.     .  calcium carbonate (TUMS - DOSED IN MG ELEMENTAL CALCIUM) 500 MG chewable tablet Chew 2 tablets by mouth 2 (two) times daily.    . celecoxib (CELEBREX) 200 MG capsule Take 200 mg by mouth as needed (joint pain).     . Cholecalciferol (VITAMIN D PO) Take 1 tablet by mouth daily.    . Coenzyme Q10 (COQ10 PO) Take 1 tablet by mouth daily at 12 noon.    . cyclobenzaprine (FLEXERIL) 10 MG tablet Take 10 mg by mouth as needed.     [name] estropipate (OGEN) 1.5 MG tablet Take 1.5 mg by mouth every morning.     . hyoscyamine (LEVSIN SL) 0.125 MG SL tablet Take 2 tablets by mouth twice daily as needed for stomach cramps    . ipratropium (ATROVENT) 0.06 % nasal spray Place 2 sprays into both nostrils 2 (two) times daily.    . magnesium gluconate (MAGONATE) 500 MG tablet Take 500 mg by mouth daily.     . Melatonin 1 MG TABS Take by mouth. Use as needed per bottle for insomnia    . NON FORMULARY Ultima electrolyte powder Take 1 tsp by mouth three times a day    . Probiotic Product (ALIGN PO) Take 1 tablet by mouth at bedtime.    [name] Shirley Friar  5 % SOLN Take 1 drop by mouth 2 (two) times daily.     No current facility-administered medications for this visit.    Allergies  Allergen Reactions  . Aspirin Other (See Comments)    Ecchymosis with high doses  . Codeine Sulfate Nausea Only  . Erythromycin Nausea And Vomiting  . Sulfa Antibiotics Nausea Only  . Sulfonamide Derivatives Nausea Only      Review of Systems negative except from HPI and PMH  Physical Exam BP 138/70 mmHg  Pulse 89  Ht 5\' 3"  (1.6 m)  Wt 135 lb 12 oz (61.576 kg)  BMI 24.05 kg/m2 Well developed and well nourished in no acute distress HENT normal E scleral and icterus clear Neck Supple JVP flat; carotids brisk and full Clear to ausculation  Regular rate and rhythm, 3/6 systolic murmur Soft with active bowel sounds No clubbing cyanosis  Edema Alert and oriented, grossly normal motor and sensory function Skin Warm and Dry  ECG  7/16 Sinus rhythm 86 Intervals 14/13/34 IVCD/LBBB  Assessment and  Plan  Hypertension  Orthostatic hypotension  Aortic stenosis-mild  Hyponatremia   Hyperkalemia  Her losartan was discontinued because of hyperkalemia. We will remeasure today.  Systolic blood pressures are elevated. Increasing doses of amlodipine were not tolerated because of edema. She is tolerating 5 mg a day. We will add atenolol to that. She will take it at night.  We discussed again pharmacological and nonpharmacological maneuvers for her orthostasis. She is very reluctant to use an abdominal binder because of her history of reflux. She will try it. She was not impressed that the raising the head of the bed affected her reflux, not appreciating was not for that; she also had problems slipping in her sheets her lift appear I would estimate at about 8 or 9 inches. She will try 4 inches.  We discussed the role of isometric contraction   More than 50% of 45 min was spent in counseling related to the above\

## 2015-03-09 ENCOUNTER — Telehealth: Payer: Self-pay | Admitting: Internal Medicine

## 2015-03-09 NOTE — Telephone Encounter (Signed)
I called the patient today to arrange a follow up BMP per Dr. Caryl Comes. The patient states she is scheduled for lab work on 2/16 with Dr. Perrin Maltese office. I advised the patient I would call there to see what they are drawing and if the K+ is part of that, then I will just request a copy be sent to [place]. I have left a message for Dr. Laurelyn Sickle office to call back and discuss.  The patient also states that since Dr. Caryl Comes started her on the atenolol 50 mg qhs, she has had a lot of fatigue. Her HR is typically in the 90's and is down in the 40-50's. I have advised her to decrease her atenolol to 25 mg qhs and see how she feels. The patient is aware I will forward to Dr. Caryl Comes for review.

## 2015-03-10 NOTE — Telephone Encounter (Signed)
I spoke with the patient and she is aware of Dr. Olin Pia recommendations to continue atenolol at 1/2 tablet (25 mg ) daily. I have advised her to call us back in ~ 2 weeks and let us know how she is feeling. I also advised her I spoke with Dr. Laurelyn Sickle office and they are agreeable with adding a BMP on to her labs on Friday. I will fax an order to [phone] Attn: Kasandra Knudsen.

## 2015-03-10 NOTE — Telephone Encounter (Signed)
Good idea with decrease atenolol  Can we followup with her sometime in the next few weeks

## 2015-04-27 ENCOUNTER — Telehealth: Payer: Self-pay | Admitting: Internal Medicine

## 2015-04-27 NOTE — Telephone Encounter (Signed)
I called and spoke with the patient's daughter, Erin Hunt. She states that the patient has had complaints of swelling to her feet for the last few weeks. She denies other symptoms of weight gain/ sob. Her HR is still running in the 50's and she is fatigued. I advised Erin Hunt that I spoke with pharmacy and that swelling is not a side effect of the atenolol, that this may be coming from the low dose amlodipine that she is taking. I have advised Erin Hunt to have the patient decrease atenolol to 12.5 mg once daily. I have made her aware that they most likely not see a change in the swelling, but to monitor her BP/ HR. Erin Hunt is asking if an echo may be an option for the patient- her last one was done in 03/2013. I explained that I will review with Dr. Caryl Comes and call her back next week to follow up on the patient. Erin Hunt is agreeable.

## 2015-04-27 NOTE — Telephone Encounter (Signed)
Pt daughter states since she was started on a beta blocker she is having swelling in her feet, HR is in the 50s, very fatigue . Please call.  [phone] mobile # if unable to reach on work #.

## 2015-04-28 DIAGNOSIS — Z461 Encounter for fitting and adjustment of hearing aid: Secondary | ICD-10-CM | POA: Insufficient documentation

## 2015-05-04 NOTE — Telephone Encounter (Signed)
Lets try and stop the amlodipine   And see what happens to the edema  Lets hold the atenolol also and then think about a different blood pressure strategy.  It would be reasonable to get an echo.  She should make a follow-up point with Dr. Humphrey Rolls in 2-3 weeks or so to address or her blood pressure might be in the absence of the above medications

## 2015-05-05 NOTE — Telephone Encounter (Signed)
I left a message on Sherry's home & cell #'s to please call.

## 2015-06-04 ENCOUNTER — Ambulatory Visit (INDEPENDENT_AMBULATORY_CARE_PROVIDER_SITE_OTHER): Payer: Medicare Other | Admitting: Internal Medicine

## 2015-06-04 ENCOUNTER — Encounter: Payer: Self-pay | Admitting: Internal Medicine

## 2015-06-04 VITALS — BP 140/68 | HR 68 | Ht 63.0 in | Wt 136.8 lb

## 2015-06-04 DIAGNOSIS — I2510 Atherosclerotic heart disease of native coronary artery without angina pectoris: Secondary | ICD-10-CM | POA: Diagnosis not present

## 2015-06-04 DIAGNOSIS — I10 Essential (primary) hypertension: Secondary | ICD-10-CM

## 2015-06-04 DIAGNOSIS — E876 Hypokalemia: Secondary | ICD-10-CM | POA: Diagnosis not present

## 2015-06-04 DIAGNOSIS — I350 Nonrheumatic aortic (valve) stenosis: Secondary | ICD-10-CM | POA: Diagnosis not present

## 2015-06-04 NOTE — Patient Instructions (Addendum)
Medication Instructions:  Your physician recommends that you continue on your current medications as directed. Please refer to the Current Medication list given to you today.  Labwork: Your physician recommends that you return for lab work on the same day as your echocardiogram for a BMET   Testing/Procedures: Your physician has requested that you have an echocardiogram. Echocardiography is a painless test that uses sound waves to create images of your heart. It provides your doctor with information about the size and shape of your heart and how well your heart's chambers and valves are working. This procedure takes approximately one hour. There are no restrictions for this procedure.  Follow-Up: Your physician wants you to follow-up in: 1 year with Dr. Caryl Comes. You will receive a reminder letter in the mail two months in advance. If you don't receive a letter, please call our office to schedule the follow-up appointment.  If you need a refill on your cardiac medications before your next appointment, please call your pharmacy.  Thank you for choosing [facility]!!

## 2015-06-04 NOTE — Progress Notes (Signed)
Patient Care Team: Perrin Maltese, MD as PCP - General (Internal Medicine)   HPI  Erin Hunt is a 82 y.o. female Seen in followup of orthostatic hypotension and systolic hypertension.  Seen 7/16 and discussed non pharmacological Rx, incl blocks isometric contraction, abdominal binders, and fluids   She's had problems with edema limiting up titration of amlodipine. ARB's were not tolerated because of hyperkalemia. At the last visit we added low-dose metoprolol.  Blood pressures have been well-controlled. On lower dose of amlodipine there has been no interval edema  She denies chest pain LH or SOB  Most recent laboratory work was notable for potassium of 5.5.Her ARB was discontinued and amlodipine was started  Echocardiogram 3/15 was reviewed. DATE TEST    3/15 echo   EF normal   mild AS           Past Medical History  Diagnosis Date  . Anemia   . IgG gammopathy   . Arthritis   . Diverticulosis   . GERD (gastroesophageal reflux disease)   . Allergic rhinitis   . IBS (irritable bowel syndrome)   . Fibromyalgia   . Carpal tunnel syndrome   . Cervical spine degeneration   . CAD (coronary artery disease)   . Hypertension   . Vitamin B12 deficiency   . LBBB (left bundle branch block)   . PONV (postoperative nausea and vomiting)     Past Surgical History  Procedure Laterality Date  . Tsa    . Vesicovaginal fistula closure w/ tah    . Cardiac catheterization    . Cholecystectomy    . Breast lumpectomy with radioactive seed localization Left 06/25/2014    Procedure: LEFT BREAST LUMPECTOMY WITH RADIOACTIVE SEED LOCALIZATION;  Surgeon: Coralie Keens, MD;  Location: [facility];  Service: General;  Laterality: Left;    Current Outpatient Prescriptions  Medication Sig Dispense Refill  . amLODipine (NORVASC) 10 MG tablet Take 1 tablet (10 mg total) by mouth daily. (Patient taking differently: Take 5 mg by mouth daily. ) 90 tablet 3  . atenolol  (TENORMIN) 50 MG tablet Take 12.5 mg by mouth daily.     [name] b complex vitamins tablet Take 1 tablet by mouth daily.    [name] BIOTIN PO Take 1,000 mg by mouth daily at 12 noon.     . celecoxib (CELEBREX) 200 MG capsule Take 200 mg by mouth as needed (joint pain).     . Cholecalciferol (VITAMIN D PO) Take 1 tablet by mouth daily.    . Coenzyme Q10 (COQ10 PO) Take 1 tablet by mouth daily at 12 noon.    . cyclobenzaprine (FLEXERIL) 10 MG tablet Take 10 mg by mouth as needed.     [name] estropipate (OGEN) 1.5 MG tablet Take 1.5 mg by mouth every morning.     . hyoscyamine (LEVSIN SL) 0.125 MG SL tablet Take 2 tablets by mouth twice daily as needed for stomach cramps    . ipratropium (ATROVENT) 0.06 % nasal spray Place 2 sprays into both nostrils 2 (two) times daily.    . magnesium gluconate (MAGONATE) 500 MG tablet Take 500 mg by mouth daily.     . Melatonin 1 MG TABS Take by mouth. Use as needed per bottle for insomnia    . NON FORMULARY Ultima electrolyte powder Take 1 tsp by mouth three times a day    . Probiotic Product (ALIGN PO) Take 1 tablet by mouth at bedtime.    [name]  XIIDRA 5 % SOLN Take 1 drop by mouth 2 (two) times daily.     No current facility-administered medications for this visit.    Allergies  Allergen Reactions  . Aspirin Other (See Comments)    Ecchymosis with high doses  . Codeine Sulfate Nausea Only  . Erythromycin Nausea And Vomiting  . Sulfa Antibiotics Nausea Only  . Sulfonamide Derivatives Nausea Only      Review of Systems negative except from HPI and PMH  Physical Exam BP 140/68 mmHg  Pulse 68  Ht 5\' 3"  (1.6 m)  Wt 136 lb 12 oz (62.029 kg)  BMI 24.23 kg/m2 Well developed and well nourished in no acute distress HENT normal E scleral and icterus clear Neck Supple JVP flat; carotids brisk and full Clear to ausculation  Regular rate and rhythm, 3/6 systolic murmur Soft with active bowel sounds No clubbing cyanosis  Edema Alert and oriented, grossly normal motor  and sensory function Skin Warm and Dry  ECG today Sinus rhythm 68 Intervals 14/13/34  /LBBB  Assessment and  Plan  Hypertension  Orthostatic hypotension  Aortic stenosis-mild  Hyperkalemia  She has done much much better on this combination of medications.  Last potassium was 5.5. We will recheck it today.  Orthostasis is much improved  Blood pressure reasonably controlled  Will repeat echo to assess Aortic Valve

## 2015-06-04 NOTE — Addendum Note (Signed)
Addended by: Stanton Kidney on: 06/04/2015 12:04 PM   Modules accepted: Orders

## 2015-06-18 ENCOUNTER — Other Ambulatory Visit: Payer: Self-pay

## 2015-06-18 ENCOUNTER — Other Ambulatory Visit: Payer: Self-pay | Admitting: Nurse Practitioner

## 2015-06-18 ENCOUNTER — Ambulatory Visit
Admission: RE | Admit: 2015-06-18 | Discharge: 2015-06-18 | Disposition: A | Discharge status: Home / Self Care | Payer: Medicare Other | Source: Ambulatory Visit | Attending: Nurse Practitioner | Admitting: Nurse Practitioner

## 2015-06-18 ENCOUNTER — Other Ambulatory Visit (INDEPENDENT_AMBULATORY_CARE_PROVIDER_SITE_OTHER): Payer: Medicare Other

## 2015-06-18 ENCOUNTER — Ambulatory Visit (INDEPENDENT_AMBULATORY_CARE_PROVIDER_SITE_OTHER): Payer: Medicare Other

## 2015-06-18 DIAGNOSIS — M79601 Pain in right arm: Secondary | ICD-10-CM

## 2015-06-18 DIAGNOSIS — I350 Nonrheumatic aortic (valve) stenosis: Secondary | ICD-10-CM

## 2015-06-18 DIAGNOSIS — M79621 Pain in right upper arm: Secondary | ICD-10-CM | POA: Diagnosis not present

## 2015-06-18 DIAGNOSIS — M19011 Primary osteoarthritis, right shoulder: Secondary | ICD-10-CM | POA: Diagnosis not present

## 2015-06-18 DIAGNOSIS — M19031 Primary osteoarthritis, right wrist: Secondary | ICD-10-CM | POA: Insufficient documentation

## 2015-06-18 DIAGNOSIS — E876 Hypokalemia: Secondary | ICD-10-CM

## 2015-06-19 LAB — BASIC METABOLIC PANEL
BUN/Creatinine Ratio: 22 (ref 12–28)
BUN: 19 mg/dL (ref 8–27)
CALCIUM: 9.2 mg/dL (ref 8.7–10.3)
CHLORIDE: 98 mmol/L (ref 96–106)
CO2: 19 mmol/L (ref 18–29)
Creatinine, Ser: 0.86 mg/dL (ref 0.57–1.00)
GFR calc non Af Amer: 62 mL/min/{1.73_m2} (ref >59)
GFR, EST AFRICAN AMERICAN: 72 mL/min/{1.73_m2} (ref >59)
GLUCOSE: 101 mg/dL — AB (ref 65–99)
POTASSIUM: 4.9 mmol/L (ref 3.5–5.2)
Sodium: 139 mmol/L (ref 134–144)

## 2015-06-24 ENCOUNTER — Telehealth: Payer: Self-pay | Admitting: Internal Medicine

## 2015-06-24 NOTE — Telephone Encounter (Signed)
Wants results for echo and recent labs.  Please call sherry at work til 3:30 and then cell .

## 2015-06-25 ENCOUNTER — Other Ambulatory Visit: Payer: Self-pay | Admitting: *Deleted

## 2015-06-25 DIAGNOSIS — I2510 Atherosclerotic heart disease of native coronary artery without angina pectoris: Secondary | ICD-10-CM

## 2015-06-25 MED ORDER — BISOPROLOL FUMARATE 5 MG PO TABS
2.5000 mg | ORAL_TABLET | Freq: Every day | ORAL | Status: DC
Start: 2015-06-25 — End: 2015-10-19

## 2015-06-25 NOTE — Telephone Encounter (Signed)
Informed patient's dtr (ok per DPR on file) of requested results. See echo result for documentation of this.

## 2015-07-06 ENCOUNTER — Telehealth (HOSPITAL_COMMUNITY): Payer: Self-pay | Admitting: *Deleted

## 2015-07-06 NOTE — Telephone Encounter (Signed)
Left message on voicemail per DPR in reference to upcoming appointment scheduled on 07/08/15 with detailed instructions given per Myocardial Perfusion Study Information Sheet for the test. LM to arrive 15 minutes early, and that it is imperative to arrive on time for appointment to keep from having the test rescheduled. If you need to cancel or reschedule your appointment, please call the office within 24 hours of your appointment. Failure to do so may result in a cancellation of your appointment, and a $50 no show fee. Phone number given for call back for any questions. Hubbard Robinson, RN

## 2015-07-08 ENCOUNTER — Ambulatory Visit (HOSPITAL_COMMUNITY): Payer: Medicare Other | Attending: Cardiovascular Disease

## 2015-07-08 DIAGNOSIS — R9439 Abnormal result of other cardiovascular function study: Secondary | ICD-10-CM | POA: Insufficient documentation

## 2015-07-08 DIAGNOSIS — R079 Chest pain, unspecified: Secondary | ICD-10-CM | POA: Diagnosis not present

## 2015-07-08 DIAGNOSIS — I454 Nonspecific intraventricular block: Secondary | ICD-10-CM | POA: Diagnosis not present

## 2015-07-08 DIAGNOSIS — I2510 Atherosclerotic heart disease of native coronary artery without angina pectoris: Secondary | ICD-10-CM | POA: Diagnosis not present

## 2015-07-08 DIAGNOSIS — I10 Essential (primary) hypertension: Secondary | ICD-10-CM | POA: Diagnosis not present

## 2015-07-08 LAB — MYOCARDIAL PERFUSION IMAGING
CHL CUP RESTING HR STRESS: 54 {beats}/min
CSEPPHR: 74 {beats}/min
LVDIAVOL: 68 mL (ref 46–106)
LVSYSVOL: 28 mL
NUC STRESS TID: 1.04
RATE: 0.33
SDS: 5
SRS: 9
SSS: 14

## 2015-07-08 MED ORDER — TECHNETIUM TC 99M TETROFOSMIN IV KIT
10.6000 | PACK | Freq: Once | INTRAVENOUS | Status: AC | PRN
  Administered 2015-07-08: 11 via INTRAVENOUS
  Filled 2015-07-08: qty 11

## 2015-07-08 MED ORDER — TECHNETIUM TC 99M TETROFOSMIN IV KIT
31.3000 | PACK | Freq: Once | INTRAVENOUS | Status: AC | PRN
  Administered 2015-07-08: 31.3 via INTRAVENOUS
  Filled 2015-07-08: qty 31

## 2015-07-08 MED ORDER — REGADENOSON 0.4 MG/5ML IV SOLN
0.4000 mg | Freq: Once | INTRAVENOUS | Status: AC
  Administered 2015-07-08: 0.4 mg via INTRAVENOUS

## 2015-07-10 ENCOUNTER — Telehealth: Payer: Self-pay | Admitting: Internal Medicine

## 2015-07-10 NOTE — Telephone Encounter (Signed)
Pt daughter would like stress test results

## 2015-07-13 ENCOUNTER — Ambulatory Visit: Payer: Self-pay

## 2015-07-13 NOTE — Telephone Encounter (Signed)
Patient daughter wants test results please call at work after 8 am tomorrow.

## 2015-07-14 NOTE — Telephone Encounter (Signed)
I left a detailed message of results on Sherry's (patient's daughter- DPR) voice mail. I asked that she call back with any questions.

## 2015-08-07 ENCOUNTER — Ambulatory Visit (INDEPENDENT_AMBULATORY_CARE_PROVIDER_SITE_OTHER): Payer: Medicare Other | Admitting: Urology

## 2015-08-07 ENCOUNTER — Encounter: Payer: Self-pay | Admitting: Urology

## 2015-08-07 VITALS — BP 158/68 | HR 66 | Ht 63.0 in | Wt 135.0 lb

## 2015-08-07 DIAGNOSIS — I2510 Atherosclerotic heart disease of native coronary artery without angina pectoris: Secondary | ICD-10-CM

## 2015-08-07 DIAGNOSIS — N3941 Urge incontinence: Secondary | ICD-10-CM

## 2015-08-07 DIAGNOSIS — N816 Rectocele: Secondary | ICD-10-CM | POA: Diagnosis not present

## 2015-08-07 DIAGNOSIS — N393 Stress incontinence (female) (male): Secondary | ICD-10-CM

## 2015-08-07 DIAGNOSIS — R32 Unspecified urinary incontinence: Secondary | ICD-10-CM | POA: Diagnosis not present

## 2015-08-07 DIAGNOSIS — N390 Urinary tract infection, site not specified: Secondary | ICD-10-CM

## 2015-08-07 LAB — URINALYSIS, COMPLETE
BILIRUBIN UA: NEGATIVE
Glucose, UA: NEGATIVE
KETONES UA: NEGATIVE
LEUKOCYTES UA: NEGATIVE
Nitrite, UA: NEGATIVE
PH UA: 5.5 (ref 5.0–7.5)
Protein, UA: NEGATIVE
RBC UA: NEGATIVE
UUROB: 0.2 mg/dL (ref 0.2–1.0)

## 2015-08-07 LAB — MICROSCOPIC EXAMINATION

## 2015-08-07 LAB — BLADDER SCAN AMB NON-IMAGING

## 2015-08-07 MED ORDER — MIRABEGRON ER 25 MG PO TB24: 25.0000 mg | ORAL_TABLET | Freq: Every day | ORAL | Status: DC

## 2015-08-07 NOTE — Progress Notes (Signed)
08/07/2015 6:13 PM   Erin Hunt 1928/04/26 [id number]  Referring provider: Perrin Maltese, MD [address] [place], [place] [address]  Chief Complaint  Patient presents with  . Urinary Incontinence    New Patient    HPI: 82 yo F who seeks to establish care for worsening mixed urinary incontinence.  She was formally a patient of Dr. Ernst Spell and has some remote history of "bladder scrapping" greater than 10 years ago (not cancer).     She reports that she has daily episodes of inability to get to the bathroom on time. She also has daytime urinary frequency and urgency. She wears 4 pad per day.  More specifically, she reports that when she lies down for more than an hour, she can't get up in time to get to the bathroom on time and will have episodes of gross incontinence.  The has been going on for greater than a year but has gotten worse over the past several months.  She denies nighttime issues. She does think that she's tried several anticholinergics in the past and not been able to tolerate them are not seen much effect.  She does also baseline stress urinary incontinence for the past several years with dribbling with physcial activity such as sneezing, working in the garden.  She has tried [employer].  S/p hysterectomy.  Occasionally has bulging sensation in her vaginal area but not particularly bothered by this.   She had had 4 spontaneously vaginal delivery.   She is currently on hornomone replacement therapy.  Not sexually active.   No vaginal dryness.  History of IBS with alternating loose stool and constipation.  She notices that her urinary symptoms are worse when constipated.  Occasionally has difficulty evacuating her stool.   She also reports increasing frequency of urinary tract infections. Symptoms include lower admominal pressure, dysuria, and foul-smelling urine. She's been treated with approximately 4-5 rounds of antibiotics just this year.  She is very  concerned about hygiene and bathes after each stool.  She recently started a probotic.  She does not duche or use any irrigating soaps.  She wipes front to back.    PVR 0.   UA today negative.    PMH: Past Medical History  Diagnosis Date  . Anemia   . IgG gammopathy   . Arthritis   . Diverticulosis   . GERD (gastroesophageal reflux disease)   . Allergic rhinitis   . IBS (irritable bowel syndrome)   . Fibromyalgia   . Carpal tunnel syndrome   . Cervical spine degeneration   . CAD (coronary artery disease)   . Hypertension   . Vitamin B12 deficiency   . LBBB (left bundle branch block)   . PONV (postoperative nausea and vomiting)     Surgical History: Past Surgical History  Procedure Laterality Date  . Tsa    . Vesicovaginal fistula closure w/ tah    . Cardiac catheterization    . Cholecystectomy    . Breast lumpectomy with radioactive seed localization Left 06/25/2014    Procedure: LEFT BREAST LUMPECTOMY WITH RADIOACTIVE SEED LOCALIZATION;  Surgeon: Coralie Keens, MD;  Location: [facility];  Service: General;  Laterality: Left;    Home Medications:    Medication List       This list is accurate as of: 08/07/15  6:13 PM.  Always use your most recent med list.               ALIGN PO  Take 1 tablet by mouth at bedtime.     amLODipine 10 MG tablet  Commonly known as:  NORVASC  Take 1 tablet (10 mg total) by mouth daily.     b complex vitamins tablet  Take 1 tablet by mouth daily.     BIOTIN PO  Take 1,000 mg by mouth daily at 12 noon.     bisoprolol 5 MG tablet  Commonly known as:  ZEBETA  Take 0.5 tablets (2.5 mg total) by mouth daily.     celecoxib 200 MG capsule  Commonly known as:  CELEBREX  Take 200 mg by mouth as needed (joint pain).     COQ10 PO  Take 1 tablet by mouth daily at 12 noon.     cyclobenzaprine 10 MG tablet  Commonly known as:  FLEXERIL  Take 10 mg by mouth as needed.     estropipate 1.5 MG tablet  Commonly known  as:  OGEN  Take 1.5 mg by mouth every morning.     hyoscyamine 0.125 MG SL tablet  Commonly known as:  LEVSIN SL  Take 2 tablets by mouth twice daily as needed for stomach cramps     ipratropium 0.06 % nasal spray  Commonly known as:  ATROVENT  Place 2 sprays into both nostrils 2 (two) times daily.     magnesium gluconate 500 MG tablet  Commonly known as:  MAGONATE  Take 500 mg by mouth daily.     Melatonin 1 MG Tabs  Take by mouth. Use as needed per bottle for insomnia     mirabegron ER 25 MG Tb24 tablet  Commonly known as:  MYRBETRIQ  Take 1 tablet (25 mg total) by mouth daily.     NON FORMULARY  Ultima electrolyte powder Take 1 tsp by mouth three times a day     VITAMIN D PO  Take 1 tablet by mouth daily.     XIIDRA 5 % Soln  Generic drug:  Lifitegrast  Take 1 drop by mouth 2 (two) times daily.        Allergies:  Allergies  Allergen Reactions  . Aspirin Other (See Comments)    Ecchymosis with high doses  . Codeine Sulfate Nausea Only  . Erythromycin Nausea And Vomiting  . Sulfa Antibiotics Nausea Only  . Sulfonamide Derivatives Nausea Only    Family History: Family History  Problem Relation Age of Onset  . Diverticulosis Mother   . Coronary artery disease Father   . Heart disease Paternal Aunt   . Heart disease Paternal Uncle   . Breast cancer Paternal Aunt   . Prostate cancer Brother   . Leukemia Brother   . Heart attack Brother   . Hypertension Brother   . Stroke Brother     Social History:  reports that she has never smoked. She does not have any smokeless tobacco history on file. She reports that she does not drink alcohol or use illicit drugs.  ROS: UROLOGY Frequent Urination?: No Hard to postpone urination?: Yes Burning/pain with urination?: No Get up at night to urinate?: No Leakage of urine?: Yes Urine stream starts and stops?: No Trouble starting stream?: No Do you have to strain to urinate?: No Blood in urine?: No Urinary tract  infection?: No Sexually transmitted disease?: No Injury to kidneys or bladder?: No Painful intercourse?: No Weak stream?: No Currently pregnant?: No Vaginal bleeding?: No Last menstrual period?: hysterectomy  Gastrointestinal Nausea?: No Vomiting?: No Indigestion/heartburn?: Yes Diarrhea?: Yes Constipation?: Yes  Constitutional Fever:  No Night sweats?: No Weight loss?: Yes Fatigue?: Yes  Skin Skin rash/lesions?: Yes Itching?: Yes  Eyes Blurred vision?: Yes Double vision?: No  Ears/Nose/Throat Sore throat?: Yes Sinus problems?: Yes  Hematologic/Lymphatic Swollen glands?: No Easy bruising?: Yes  Cardiovascular Leg swelling?: No Chest pain?: No  Respiratory Cough?: Yes Shortness of breath?: No  Endocrine Excessive thirst?: No  Musculoskeletal Back pain?: Yes Joint pain?: Yes  Neurological Headaches?: No Dizziness?: Yes  Psychologic Depression?: No Anxiety?: Yes  Physical Exam: BP 158/68 mmHg  Pulse 66  Ht 5\' 3"  (1.6 m)  Wt 135 lb (61.236 kg)  BMI 23.92 kg/m2  Constitutional:  Alert and oriented, No acute distress.  Presents with daughter today. HEENT: [place] AT, moist mucus membranes.  Trachea midline, no masses. Cardiovascular: No clubbing, cyanosis, or edema. Respiratory: Normal respiratory effort, no increased work of breathing. GI: Abdomen is soft, nontender, nondistended, no abdominal masses GU: No CVA tenderness.  Pelvic: Normal external genitalia. Capacious urethral meatus with significant hypermobility but no demonstrable stress urinary incontinence with Valsalva. Decently estrogenized vaginal tissues. Subtle cystocele. No apical descent with Valsalva. Stage II rectocele to the introitus with Valsalva. Skin: No rashes, bruises or suspicious lesions. Lymph: No cervical or inguinal adenopathy. Neurologic: Grossly intact, no focal deficits, moving all 4 extremities. Psychiatric: Normal mood and affect.  Laboratory Data: Lab Results    Component Value Date   WBC 8.2 11/26/2014   HGB 13.8 11/26/2014   HCT 41.5 11/26/2014   MCV 84.6 11/26/2014   PLT 325.0 11/26/2014    Lab Results  Component Value Date   CREATININE 0.86 06/18/2015     Lab Results  Component Value Date   HGBA1C 5.8* 03/26/2013    Urinalysis Results for orders placed or performed in visit on 08/07/15  Microscopic Examination  Result Value Ref Range   WBC, UA 0-5 0 -  5 /hpf   RBC, UA 0-2 0 -  2 /hpf   Epithelial Cells (non renal) 0-10 0 - 10 /hpf   Bacteria, UA Few None seen/Few  Urinalysis, Complete  Result Value Ref Range   Specific Gravity, UA <1.005 (L) 1.005 - 1.030   pH, UA 5.5 5.0 - 7.5   Color, UA Yellow Yellow   Appearance Ur Hazy (A) Clear   Leukocytes, UA Negative Negative   Protein, UA Negative Negative/Trace   Glucose, UA Negative Negative   Ketones, UA Negative Negative   RBC, UA Negative Negative   Bilirubin, UA Negative Negative   Urobilinogen, Ur 0.2 0.2 - 1.0 mg/dL   Nitrite, UA Negative Negative   Microscopic Examination See below:   BLADDER SCAN AMB NON-IMAGING  Result Value Ref Range   Scan Result 62ml    Assessment & Plan:    1. Urinary incontinence, urge We discussed options for management of her urinary urgency, frequency, and urge incontinence.  Behavioral modification was reviewed. Given her age and previous failure of anticholinergics, who preferred to avoid these medications. We discussed beta 3 Agnes in the form of Mybetriq 25 mg. She was given 2 weeks samples of this today. She'll call us and let us know if it's effective and if not, will increase the dose to 50 mg.   We also did briefly discuss options moving forward for refractory OAB including posterior nerve stimulation, Botox, and InterStim as options moving forward if she fails this medication. All of her questions were answered in detail.  - Urinalysis, Complete - BLADDER SCAN AMB NON-IMAGING  2. Stress incontinence, female Patient  does have  baseline stress urinary incontinence. She does take a little exercises irregularly but is not sure she is doing these correctly. I have offered her referral to physical therapy for pelvic floor teaching. She is amenable to this. - Ambulatory referral to Physical Therapy  3. Recurrent UTI No role for topical estrogen therapy as she is ready on oral hormone replacement therapy and has seemingly well and Shouldice tissues. We reviewed hygiene issues including wiping front to back, probiotic, adequate hydration, addition of cranberry tabs amongst others in UTI prevention.  4. Rectocele Fairly significant rectocele appreciated on exam today. She has a history of difficulty evacuating her stool which I suspect is related to this. We discussed options for management of her rectocele including surgical management, pessary, and splinting as needed. She was instructed how to do this. She is not interested in a pessary or surgery.    Return in about 3 months (around 11/07/2015) for symptoms recheck.  Hollice Espy, MD  [facility] [address], [place], [place] [address] [phone]

## 2015-09-07 ENCOUNTER — Telehealth: Payer: Self-pay | Admitting: Urology

## 2015-09-07 NOTE — Telephone Encounter (Signed)
Increase to 50 mg daily.  Please provide 2 weeks samples and have them call back.  If not working, please arrange office visit.    Hollice Espy, MD

## 2015-09-07 NOTE — Telephone Encounter (Signed)
Patient's daughter, Ginger Carne, called the office today and stated that the Myrbetriq is not helping.  She is wanting to know if they can increase the dosage or if there is something else they can try.  Please advise.

## 2015-09-07 NOTE — Telephone Encounter (Signed)
Spoke with [name], pt daughter, and made aware of 50mg  of myrbetriq samples being left up front. Also made aware if medication does not work then pt will need an office visit. Sherry voiced understanding.

## 2015-09-22 ENCOUNTER — Telehealth: Payer: Self-pay | Admitting: Urology

## 2015-09-22 DIAGNOSIS — N3281 Overactive bladder: Secondary | ICD-10-CM

## 2015-09-22 MED ORDER — MIRABEGRON ER 50 MG PO TB24: 50.0000 mg | ORAL_TABLET | Freq: Every day | ORAL | 3 refills | Status: DC

## 2015-09-22 NOTE — Telephone Encounter (Signed)
Pt was given 1 week of samples until mail order arrives.

## 2015-10-05 DIAGNOSIS — R490 Dysphonia: Secondary | ICD-10-CM | POA: Insufficient documentation

## 2015-10-05 DIAGNOSIS — R070 Pain in throat: Secondary | ICD-10-CM | POA: Insufficient documentation

## 2015-10-19 ENCOUNTER — Other Ambulatory Visit: Payer: Self-pay | Admitting: Internal Medicine

## 2015-10-21 ENCOUNTER — Other Ambulatory Visit: Payer: Self-pay

## 2015-10-21 DIAGNOSIS — N3281 Overactive bladder: Secondary | ICD-10-CM

## 2015-10-21 MED ORDER — MIRABEGRON ER 50 MG PO TB24: 50.0000 mg | ORAL_TABLET | Freq: Every day | ORAL | 1 refills | Status: DC

## 2015-10-23 ENCOUNTER — Encounter: Payer: Self-pay | Admitting: Physical Therapy

## 2015-10-23 ENCOUNTER — Encounter: Payer: Self-pay | Admitting: Speech Pathology

## 2015-10-23 ENCOUNTER — Ambulatory Visit: Payer: Medicare Other | Admitting: Urology

## 2015-10-23 ENCOUNTER — Ambulatory Visit: Payer: Medicare Other | Admitting: Speech Pathology

## 2015-10-23 ENCOUNTER — Ambulatory Visit: Payer: Medicare Other | Attending: Urology | Admitting: Physical Therapy

## 2015-10-23 DIAGNOSIS — R490 Dysphonia: Secondary | ICD-10-CM

## 2015-10-23 DIAGNOSIS — M791 Myalgia, unspecified site: Secondary | ICD-10-CM

## 2015-10-23 DIAGNOSIS — R278 Other lack of coordination: Secondary | ICD-10-CM | POA: Diagnosis present

## 2015-10-23 DIAGNOSIS — R2689 Other abnormalities of gait and mobility: Secondary | ICD-10-CM | POA: Diagnosis not present

## 2015-10-23 NOTE — Therapy (Signed)
[place] MAIN [facility] SERVICES [address] [place], [place], [address] Phone: [phone]   Fax:  [phone]  Speech Language Pathology Evaluation  Patient Details  Name: Erin Hunt MRN: [id number] Date of Birth: 80/26/1930 Referring Provider: Dr. [name]  Encounter Date: 10/23/2015      End of Session - 10/23/15 1549    Visit Number 1   Number of Visits 17   Date for SLP Re-Evaluation 12/18/15   SLP Start Time [id number]   SLP Stop Time  1500   SLP Time Calculation (min) 55 min   Activity Tolerance Patient tolerated treatment well      Past Medical History:  Diagnosis Date  . Allergic rhinitis   . Anemia   . Arthritis   . CAD (coronary artery disease)   . Carpal tunnel syndrome   . Cervical spine degeneration   . Diverticulosis   . Fibromyalgia   . GERD (gastroesophageal reflux disease)   . Hypertension   . IBS (irritable bowel syndrome)   . IgG gammopathy   . LBBB (left bundle branch block)   . PONV (postoperative nausea and vomiting)   . Vitamin B12 deficiency     Past Surgical History:  Procedure Laterality Date  . BREAST LUMPECTOMY WITH RADIOACTIVE SEED LOCALIZATION Left 06/25/2014   Procedure: LEFT BREAST LUMPECTOMY WITH RADIOACTIVE SEED LOCALIZATION;  Surgeon: Coralie Keens, MD;  Location: [place];  Service: General;  Laterality: Left;  . CARDIAC CATHETERIZATION    . CHOLECYSTECTOMY    . TSA    . VAGINAL HYSTERECTOMY     L ovary removed  . VESICOVAGINAL FISTULA CLOSURE W/ TAH      There were no vitals filed for this visit.      Subjective Assessment - 10/23/15 1547    Subjective The patient reports that she wants to "fix her voice." She has tolerated hoarseness for several years, but began to experience pain when speaking about two years ago, which has become increasingly more intense. The patient reports she "couldn't take the pain anymore."   Currently in Pain? Other (Comment)             SLP Evaluation [facility] - 10/23/15 1550      SLP Visit Information   SLP Received On 10/23/15   Referring Provider Dr. [name]   Onset Date 07/01/2015   Medical Diagnosis vocal fold bowing     Subjective   Subjective      Pain Assessment   Currently in Pain? Other (Comment)  reports chronic pain with speaking     General Information   HPI The patient has a history of GERD and chronic cough. She also reports chronic postnasal drainage as well as difficulty swallowing dry foods because they trigger coughing. She reports excellent compliance with doctor recommendations for management of these conditions. The patient limits intake of caffeine and acidic foods, does not consume carbonated beverages, drinks plentiful water, elevates the head of her bed, and implements cough distraction techniques. The patient reports limited talking throughout the day. She frequently talks on the phone but keeps conversations short out of preference. She denies history of formal singing or voice training. The patient reports that her throat often feels chronically sore and that vocal quality and pain worsen with use. The patient reports that voice problems began with gradual onset of hoarseness several years ago. Over time, hoarseness fluctuated and became worse. The patient reports onset of pain with vocalization approximately two years ago;  pain has gradually increased over time. She also describes speaking as involving strain. The patient demonstrated hoarse vocal quality characterized by diplophonia, short maximum phonation duration, intermittent aphonia, sudden changes in pitch, and reduced loudness. Her vocal quality was consistent with strain, laryngeal tension, and poor breath support.      Oral Motor/Sensory Function   Overall Oral Motor/Sensory Function Appears within functional limits for tasks assessed     Motor Speech   Overall Motor Speech Impaired     Standardized Assessments    Standardized Assessments  Other Assessment  Perceptual Voice Evaluation   Other Assessment The patient has a history of GERD and chronic cough. She also reports chronic postnasal drainage as well as difficulty swallowing dry foods because they trigger coughing. She reports excellent compliance with doctor recommendations for management of these conditions. The patient limits intake of caffeine and acidic foods, does not consume carbonated beverages, drinks plentiful water, elevates the head of her bed, and implements cough distraction techniques. The patient reports limited talking throughout the day. She frequently talks on the phone but keeps conversations short out of preference. She denies history of formal singing or voice training. The patient reports that her throat often feels chronically sore and that vocal quality and pain worsen with use. The patient reports that voice problems began with gradual onset of hoarseness several years ago. Over time, hoarseness fluctuated and became worse. The patient reports onset of pain with vocalization approximately two years ago; pain has gradually increased over time. She also describes speaking as involving strain. The patient demonstrated hoarse vocal quality characterized by diplophonia, short maximum phonation duration, intermittent aphonia, sudden changes in pitch, and reduced loudness. Her vocal quality was consistent with strain, laryngeal tension, and poor breath support.       Score on Voice Handicap Index: 31. A score of 10 or higher indicates perceived handicap  Maximum phonation time for sustained "ah": 3 seconds  Mean intensity during sustained "ah": 67 dB  Mean intensity sustained during conversational speech: 63 dB  Average time patient was able to sustain /s/: 5.67 seconds  Average time patient was able to sustain /z/: 1.67 seconds  s/z ratio : 3.4  Stimulability: Following neck stretches and breath support exercises, the patient was  provided with a model of easy-onset, loud vowel phonation with good oral resonance and breath support. The patient was instructed to attempt maximum vowel phonation imitating this model ("loud like me"). The patient's maximum vowel phonation duration increased to 7 seconds, and vocal quality was markedly improved. Average sound pressure level of this production was approximately 73 dB, diplophonia was resolved, pitch was constant until the end of the phonation, and hoarse quality was abated. The patient reported that vowel production was "easier"/less strained this time.                    SLP Education - 10/23/15 1548    Education provided Yes   Education Details role of SLP in voice therapy, breath support, neck muscle relaxation   Person(s) Educated Patient   Methods Explanation;Demonstration   Comprehension Verbalized understanding;Returned demonstration            SLP Long Term Goals - 10/23/15 1553      SLP LONG TERM GOAL #1   Title The patient will demonstrate independent understanding of vocal hygiene concepts and neck, shoulder, lingual stretching exercises.   Time 8   Period Weeks   Status New     SLP LONG TERM  GOAL #2   Title The patient will be independent for abdominal breathing and breath support exercises.   Time 8   Period Weeks   Status New     SLP LONG TERM GOAL #3   Title The patient will minimize vocal tension via Yawn-Sigh approach (or comparable technique) with min SLP cues with 80% accuracy.   Time 8   Period Weeks   Status New     SLP LONG TERM GOAL #4   Title The patient will maintain relaxed phonation / oral resonance for paragraph length recitation with 80% accuracy.   Time 8   Period Weeks   Status New     SLP LONG TERM GOAL #5   Title The patient will maximize voice quality and loudness using breath support for sustained vowel production, pitch glides, and hierarchal speech drill.   Time 8   Period Weeks   Status New      Additional Long Term Goals   Additional Long Term Goals Yes     SLP LONG TERM GOAL #6   Title The patient will maximize voice quality and loudness using breath support for paragraph length recitation with 80% accuracy.   Time 8   Period Weeks   Status New          Plan - 10/23/15 1550    Clinical Impression Statement This 82 year old woman under the care of Dr. [name], with vocal fold bowing and painful voicing, is presenting with severe dysphonia.  The patient demonstrates hoarse vocal quality, strained/tense phonation, limited pitch range, pitch instability, vocal fatigue, laryngeal tension, reduced loudness, and poor breath support. She will benefit from voice therapy for education, to improve breath support, improve tone focus, promote easy flow phonation, reduce strain/tension, improve oral resonance, and learn techniques to increase loudness and pitch range and control without strain or pain.   Speech Therapy Frequency 2x / week   Duration Other (comment)  8 weeks   Treatment/Interventions SLP instruction and feedback;Other (comment)  voice tx: laryngeal relaxation, breath support, oral resonance   Potential to Achieve Goals Good   Potential Considerations Cooperation/participation level;Severity of impairments;Previous level of function;Co-morbidities;Pain level;Ability to learn/carryover information;Family/community support   SLP Home Exercise Plan breath support exercises & neck stretches   Consulted and Agree with Plan of Care Patient      Patient will benefit from skilled therapeutic intervention in order to improve the following deficits and impairments:   Dysphonia    Problem List Patient Active Problem List   Diagnosis Date Noted  . Upper airway cough syndrome 07/29/2014  . Chest pain, atypical 04/09/2013  . TIA (transient ischemic attack) 03/25/2013  . Aortic stenosis 01/02/2013  . CAD (coronary artery disease), native coronary artery 01/02/2013  . MGUS  (monoclonal gammopathy of unknown significance) 03/25/2011  . Lung nodules 08/30/2010  . ABDOMINAL PAIN 11/30/2009  . COUGH, CHRONIC 10/23/2009  . PERIPHERAL NEUROPATHY, FEET 09/16/2009  . ANXIETY, SITUATIONAL 01/28/2009  . FACIAL PAIN 01/28/2009  . POSTMENOPAUSAL STATUS 06/11/2008  . Vaginitis and vulvovaginitis, unspecified 05/14/2008  . GERD 03/26/2008  . FREQUENCY, URINARY 03/26/2008  . CARPAL TUNNEL SYNDROME, BILATERAL 01/21/2008  . OSTEOARTHRITIS, HANDS, BILATERAL 01/21/2008  . DEGENERATIVE DISC DISEASE, CERVICAL SPINE 01/21/2008  . B12 DEFICIENCY 12/04/2007  . Hyperlipidemia 12/04/2007  . Essential hypertension, benign 12/04/2007  . Allergic rhinitis 12/04/2007  . DIVERTICULOSIS OF COLON 12/04/2007  . IRRITABLE BOWEL SYNDROME 12/04/2007  . FIBROMYALGIA 12/04/2007    Rickard Rhymes  Graduate Student Clinician 10/23/2015,  4:03 PM  [place] MAIN [facility] SERVICES [address] [place], [place], [address] Phone: [phone]   Fax:  [phone]  Name: CEDRICA KELLEY MRN: [id number] Date of Birth: 1928/11/17

## 2015-10-23 NOTE — Therapy (Addendum)
[place] MAIN [facility] SERVICES [address] [place], [place], [address] Phone: [phone]   Fax:  [phone]  Physical Therapy Evaluation  Patient Details  Name: Erin Hunt MRN: [id number] Date of Birth: Oct 13, 1928 Referring Provider: Dr. Erlene Quan   Encounter Date: 10/23/2015      PT End of Session - 10/23/15 1404    Visit Number 1   Number of Visits 12   Date for PT Re-Evaluation 02/04/2016   Authorization Type g codes   PT Start Time [id number]   PT Stop Time [id number]   PT Time Calculation (min) 60 min   Activity Tolerance Patient tolerated treatment well;No increased pain   Behavior During Therapy WFL for tasks assessed/performed      Past Medical History:  Diagnosis Date  . Allergic rhinitis   . Anemia   . Arthritis   . CAD (coronary artery disease)   . Carpal tunnel syndrome   . Cervical spine degeneration   . Diverticulosis   . Fibromyalgia   . GERD (gastroesophageal reflux disease)   . Hypertension   . IBS (irritable bowel syndrome)   . IgG gammopathy   . LBBB (left bundle branch block)   . PONV (postoperative nausea and vomiting)   . Vitamin B12 deficiency     Past Surgical History:  Procedure Laterality Date  . BREAST LUMPECTOMY WITH RADIOACTIVE SEED LOCALIZATION Left 06/25/2014   Procedure: LEFT BREAST LUMPECTOMY WITH RADIOACTIVE SEED LOCALIZATION;  Surgeon: Coralie Keens, MD;  Location: [place];  Service: General;  Laterality: Left;  . CARDIAC CATHETERIZATION    . CHOLECYSTECTOMY    . TSA    . VAGINAL HYSTERECTOMY     L ovary removed  . VESICOVAGINAL FISTULA CLOSURE W/ TAH      There were no vitals filed for this visit.       Subjective Assessment - 10/23/15 1317    Subjective 1) urinary dysfunction: Pt has urge incontinence and SUI. Urge incontinence has improved by 50% with her medication. Pt has no problem as long as she is moving but when she sits back in a lounge chair for 1 hr watching  TV, then she has leakage when getting tot he bathroom.  Denied leakage at night nor nocturia.  Pt changes her pads 2x day.  Urination frequency:  1 x / every 2 hrs.  Daily fluid intake: 6 cups of water, no sodas, 3 cups of coffee, 2 cups of green tea.  Pt feels lowered sensation in the vaginal area when constipated.    2) bowel dysfunction: Bowel frequency:  constipation 2-3 days and sometimes daily movements. Stool consistency vary between:  [place] Stool Type 2-7 due to IBS.  Pt has two days out of the week she has normal Type 4 bowels.    3) LBP: occassionally with constipation.      Pertinent History 4 vaginal deliveries. stitches with every delivery. Take a estrogen pill for menopause.  Has not worked with a nutritionist for IBS. Pt eats "healthy" with veggies and fruit. Daily fluid intake: 6 cups of water, no sodas, 3 cups of coffee, 2 cups of green tea.  Workout: total gym. includes 5-6 sit-ups , standing toe touches 10 reps,  Gardening/ yard work: mowed and pick up sticks.  Pt has silent GERD and her breathing has limited her from gardening as much.    Patient Stated Goals not have to wear a pad    Currently in Pain? No/denies  [facility] PT Assessment - 10/23/15 1401      Assessment   Medical Diagnosis urinary incontinence    Referring Provider Dr. Erlene Quan      Precautions   Precautions None     Restrictions   Weight Bearing Restrictions No     Balance Screen   Has the patient fallen in the past 6 months No     Prior Function   Level of Independence Independent     Observation/Other Assessments   Observations slight thoracic kyphosis      Coordination   Gross Motor Movements are Fluid and Coordinated --  limited diaphragm excursion with chest breathing   Fine Motor Movements are Fluid and Coordinated --  abdominal straining wtihe exhalation / cue for BM     Palpation   Spinal mobility limited thoracic mobility with increased midback tensions   Palpation  comment L medial to ASIS with tenderness and tensions pre Tx, and decreased post Tx      Bed Mobility   Bed Mobility --  half crunch                 Pelvic Floor Special Questions - 10/23/15 1403    Diastasis Recti neg          [facility] Adult PT Treatment/Exercise - 10/23/15 1401      Therapeutic Activites    Therapeutic Activities --  see pt instructions     Manual Therapy   Manual therapy comments colon massage                PT Education - 10/23/15 1404    Education provided Yes   Education Details HEP ,POC, anatomy, physiology, goals   Person(s) Educated Patient   Methods Explanation;Demonstration;Tactile cues;Verbal cues;Handout   Comprehension Verbalized understanding;Returned demonstration             PT Long Term Goals - 10/23/15 1330      PT LONG TERM GOAL #1   Title Pt will decrease her pad wear from 2 x/ day to < 1 x/ day in order to improve QOL   Time 12   Period Weeks   Status New     PT LONG TERM GOAL #2   Title Pt will improve her gait speed from .88 m/s to > 1.0 m/s in order to ambulate safely in community   Time 12   Period Weeks   Status New     PT LONG TERM GOAL #3   Title Pt will demo IND with modifications to fitness exercises that do not place load on her pelvic floor mm in order to improve constipation and urinary issues.   Time 12   Period Weeks   Status New     PT LONG TERM GOAL #4   Title Pt will report having Type 3-4 stool consistency 50% of the time during a week span in order to improve bowel function   Baseline 12   Period Weeks   Status New     PT LONG TERM GOAL #5   Title Pt will demo decreased scar restrictions in perineal area in order to promote function of pelvic floor   Time 12   Period Weeks   Status New     Additional Long Term Goals   Additional Long Term Goals Yes     PT LONG TERM GOAL #6   Title Pt will decrease her score on PFDI score from  to <  % in order to demo improved ADLs  Period Weeks   Status New     PT LONG TERM GOAL #7   Title Pt will report decreased straining 100% of the time with bowl movements to minimize strain on the pelvic floor   Time 12   Period Weeks   Status New               Plan - 11-21-2015 23-May-2244    Clinical Impression Statement Pt is a 82 yo female who c/o urinary and bowel dysfunctions and CLBP. Pt 's clinical presentations include increased thoracic kyphosis, abdominal straining with pelvic floor movements, weak deep core mm, slowed gait, and poor education on bladder irritants. Additional contributing factors that likely impact her Sx include her engagement in activities that place increased load on her pelvic floor (i.e. gardening, fitness routine),her Hx of four vaginal deliveries with perineal injuries, and Hx of IBS. Following manual Tx today, pt reported feeling less tenderness/ tensions in her L LQ of abdomen. Pt was educated on performing colon massage to promote bowel function.     Rehab Potential Good   Clinical Impairments Affecting Rehab Potential performed sit -ups and spinal flexions as part of her fitness routine, Hx of perineal stitches with all 4 vaginal deliveries   PT Frequency 1x / week   PT Duration 12 weeks   PT Treatment/Interventions ADLs/Self Care Home Management;Aquatic Therapy;Biofeedback;Lobbyist;Therapeutic exercise;Therapeutic activities;Functional mobility training;Stair training;Gait training;Neuromuscular re-education;Patient/family education;Scar mobilization;Manual lymph drainage;Manual techniques;Passive range of motion;Energy conservation;Taping   Consulted and Agree with Plan of Care Patient      Patient will benefit from skilled therapeutic intervention in order to improve the following deficits and impairments:  Abnormal gait, Improper body mechanics, Impaired sensation, Increased fascial restrictions, Decreased coordination, Decreased scar mobility, Decreased  mobility, Postural dysfunction, Decreased range of motion, Decreased endurance, Decreased safety awareness  Visit Diagnosis: Other abnormalities of gait and mobility  Other lack of coordination  Myalgia      G-Codes - 11/21/15 2251-05-24    Functional Assessment Tool Used .88 m/s gait, PFDI score to be administered at next session   Functional Limitation Mobility: Walking and moving around   Mobility: Walking and Moving Around Current Status [phone]) At least 60 percent but less than 80 percent impaired, limited or restricted   Mobility: Walking and Moving Around Goal Status [phone]) At least 40 percent but less than 60 percent impaired, limited or restricted       Problem List Patient Active Problem List   Diagnosis Date Noted  . Upper airway cough syndrome 07/29/2014  . Chest pain, atypical 04/09/2013  . TIA (transient ischemic attack) 03/25/2013  . Aortic stenosis 01/02/2013  . CAD (coronary artery disease), native coronary artery 01/02/2013  . MGUS (monoclonal gammopathy of unknown significance) 03/25/2011  . Lung nodules 08/30/2010  . ABDOMINAL PAIN 11/30/2009  . COUGH, CHRONIC 10/23/2009  . PERIPHERAL NEUROPATHY, FEET 09/16/2009  . ANXIETY, SITUATIONAL 01/28/2009  . FACIAL PAIN 01/28/2009  . POSTMENOPAUSAL STATUS 06/11/2008  . Vaginitis and vulvovaginitis, unspecified 05/14/2008  . GERD 03/26/2008  . FREQUENCY, URINARY 03/26/2008  . CARPAL TUNNEL SYNDROME, BILATERAL 01/21/2008  . OSTEOARTHRITIS, HANDS, BILATERAL 01/21/2008  . DEGENERATIVE DISC DISEASE, CERVICAL SPINE 01/21/2008  . B12 DEFICIENCY 12/04/2007  . Hyperlipidemia 12/04/2007  . Essential hypertension, benign 12/04/2007  . Allergic rhinitis 12/04/2007  . DIVERTICULOSIS OF COLON 12/04/2007  . IRRITABLE BOWEL SYNDROME 12/04/2007  . FIBROMYALGIA 12/04/2007    Jerl Mina ,PT, DPT, E-RYT  21-Nov-2015, 11:08 PM  [place]  MAIN [facility] SERVICES [place], [place], [address] Phone: [phone]   Fax:  [phone]  Name: GERRIANNE ABBASI MRN: [id number] Date of Birth: 01/17/1929

## 2015-10-23 NOTE — Patient Instructions (Addendum)
Decreasing bladder irritants from 3 cups of coffee, 2 cups of green tea to one less cup of each, and increased 6 cups of water to one more cup  To improve constipation and urge to urinate     ____________  Light and gentle Colon massage ( clockwise from Right lower abdominal to upper Right to upper left and down)  5 5-10 reps in the morning and night ___________

## 2015-10-26 DIAGNOSIS — I10 Essential (primary) hypertension: Secondary | ICD-10-CM | POA: Insufficient documentation

## 2015-10-26 DIAGNOSIS — M1990 Unspecified osteoarthritis, unspecified site: Secondary | ICD-10-CM | POA: Insufficient documentation

## 2015-10-26 NOTE — Addendum Note (Signed)
Addended by: Jerl Mina on: 10/26/2015 10:25 PM   Modules accepted: Orders

## 2015-10-30 ENCOUNTER — Ambulatory Visit: Payer: Medicare Other | Admitting: Speech Pathology

## 2015-10-30 ENCOUNTER — Ambulatory Visit: Payer: Medicare Other | Admitting: Physical Therapy

## 2015-11-06 ENCOUNTER — Ambulatory Visit: Payer: Medicare Other | Admitting: Speech Pathology

## 2015-11-06 ENCOUNTER — Ambulatory Visit: Payer: Medicare Other | Admitting: Physical Therapy

## 2015-11-12 ENCOUNTER — Ambulatory Visit: Payer: Medicare Other | Admitting: Speech Pathology

## 2015-11-12 ENCOUNTER — Other Ambulatory Visit: Payer: Self-pay | Admitting: Obstetrics & Gynecology

## 2015-11-12 ENCOUNTER — Ambulatory Visit: Payer: Medicare Other | Attending: Urology | Admitting: Physical Therapy

## 2015-11-12 DIAGNOSIS — M791 Myalgia, unspecified site: Secondary | ICD-10-CM

## 2015-11-12 DIAGNOSIS — R2689 Other abnormalities of gait and mobility: Secondary | ICD-10-CM

## 2015-11-12 DIAGNOSIS — R490 Dysphonia: Secondary | ICD-10-CM

## 2015-11-12 DIAGNOSIS — E2839 Other primary ovarian failure: Secondary | ICD-10-CM

## 2015-11-12 NOTE — Therapy (Signed)
[place] MAIN [facility] SERVICES [address] [place], [place], [address] Phone: [phone]   Fax:  [phone]  Physical Therapy Treatment  Patient Details  Name: Erin Hunt MRN: [id number] Date of Birth: [birth year] Referring Provider: Dr. Erlene Quan   Encounter Date: 11/12/2015      PT End of Session - 11/12/15 1559    Visit Number 2   Number of Visits 12   Date for PT Re-Evaluation 2016/01/22   Authorization Type g codes   PT Start Time 1500   PT Stop Time 1600   PT Time Calculation (min) 60 min   Activity Tolerance Patient tolerated treatment well;No increased pain   Behavior During Therapy WFL for tasks assessed/performed      Past Medical History:  Diagnosis Date  . Allergic rhinitis   . Anemia   . Arthritis   . CAD (coronary artery disease)   . Carpal tunnel syndrome   . Cervical spine degeneration   . Diverticulosis   . Fibromyalgia   . GERD (gastroesophageal reflux disease)   . Hypertension   . IBS (irritable bowel syndrome)   . IgG gammopathy   . LBBB (left bundle branch block)   . PONV (postoperative nausea and vomiting)   . Vitamin B12 deficiency     Past Surgical History:  Procedure Laterality Date  . BREAST LUMPECTOMY WITH RADIOACTIVE SEED LOCALIZATION Left 06/25/2014   Procedure: LEFT BREAST LUMPECTOMY WITH RADIOACTIVE SEED LOCALIZATION;  Surgeon: Coralie Keens, MD;  Location: [place];  Service: General;  Laterality: Left;  . CARDIAC CATHETERIZATION    . CHOLECYSTECTOMY    . TSA    . VAGINAL HYSTERECTOMY     L ovary removed  . VESICOVAGINAL FISTULA CLOSURE W/ TAH      There were no vitals filed for this visit.      Subjective Assessment - 11/12/15 1507    Subjective Pt reported she got a UTI 2 weeks ago and she completed her antibiotics. Pt has been having daily bowel movements instead of every 3 days. Pt started taking ground flax seed in her oatmeal. Pt notices her bladder is worst  with constipation.    Pertinent History 4 vaginal deliveries. stitches with every delivery. Take a estrogen pill for menopause.  Has not worked with a nutritionist for IBS. Pt eats "healthy" with veggies and fruit. Daily fluid intake: 6 cups of water, no sodas, 3 cups of coffee, 2 cups of green tea.  Workout: total gym. includes 5-6 sit-ups , standing toe touches 10 reps,  Gardening/ yard work: mowed and pick up sticks.  Pt has silent GERD and her breathing has limited her from gardening as much.    Patient Stated Goals not have to wear a pad             [facility] PT Assessment - 11/12/15 1554      Palpation   Spinal mobility increased paraspinal mm tensions medial to scapula B      Pelvic floor activation with inhalation not as strong as exhalation.  Increased diaphragmatic breathing achieved with training.              Pelvic Floor Special Questions - 11/12/15 1553    Pelvic Floor Internal Exam pt consented verbally and had no contraindications. Pt completed her antibiotics from a UTI   Exam Type Vaginal   Palpation decreased activation of R side of pelvic floor mm, noted erineal scar in posterior mm  Strength fair squeeze, definite lift   Strength # of reps 2   Strength # of seconds 7     HEP: 3 sec, 3 reps, 3 x day for endurance and 3 reps for quick power training.        [facility] Adult PT Treatment/Exercise - 11/12/15 1555      Therapeutic Activites    Therapeutic Activities --  see pt instructions      Manual Therapy   Manual therapy comments thiele massage on R pelvic floor , scar release on R perineal scar , STM release in supine position along paraspinals , gentle, short duration distraction at occiput      Moist heat 5' behind back with skin intact.           PT Education - 11/12/15 1558    Education provided Yes   Education Details HEP   Person(s) Educated Patient   Methods Explanation;Demonstration;Tactile cues;Verbal cues;Handout   Comprehension  Returned demonstration;Verbalized understanding             PT Long Term Goals - 10/23/15 1330      PT LONG TERM GOAL #1   Title Pt will decrease her pad wear from 2 x/ day to < 1 x/ day in order to improve QOL   Time 12   Period Weeks   Status New     PT LONG TERM GOAL #2   Title Pt will improve her gait speed from .88 m/s to > 1.0 m/s in order to ambulate safely in community   Time 12   Period Weeks   Status New     PT LONG TERM GOAL #3   Title Pt will demo IND with modifciations to fitness exercises that do not place load on her pelvic floor mm in order to improve constipation and urinary issues.   Time 12   Period Weeks   Status New     PT LONG TERM GOAL #4   Title Pt will report having Type 3-4 stool consistency 50% of the time during a week span in order to improve bowel function   Baseline 12   Period Weeks   Status New     PT LONG TERM GOAL #5   Title Pt will demo decreased scar restrictions in perineal area in order to promote function of pelvic floor   Time 12   Period Weeks   Status New     Additional Long Term Goals   Additional Long Term Goals Yes     PT LONG TERM GOAL #6   Title Pt will decrease her score on PFDI score from  to <  % in order to demo improved ADLs   Period Weeks   Status New     PT LONG TERM GOAL #7   Title Pt will report decreased straining 100% of the time with bowl movements to minimize strain on the pelvic floor   Time 12   Period Weeks   Status New               Plan - 11/12/15 1559    Clinical Impression Statement Pt showed low pelvic floor endurance and asymmtrical contraction. R sided muscles gained more ROM following perineal scar and thiele massage. Pt also showed increased grade strength following proper activation training of pelvic floor mm.  Initiated thoracic extension manual work and exercises to optimize alignment of deep core. Pt will continue to benefit from skilled PT.    Rehab Potential Good  Clinical Impairments Affecting Rehab Potential performed sit -ups and spinal flexions as part of her fitness routine, Hx of perineal stitches with all 4 vaginal deliveries   PT Frequency 1x / week   PT Duration 12 weeks   PT Treatment/Interventions ADLs/Self Care Home Management;Aquatic Therapy;Biofeedback;Lobbyist;Therapeutic exercise;Therapeutic activities;Functional mobility training;Stair training;Gait training;Neuromuscular re-education;Patient/family education;Scar mobilization;Manual lymph drainage;Manual techniques;Passive range of motion;Energy conservation;Taping   Consulted and Agree with Plan of Care Patient      Patient will benefit from skilled therapeutic intervention in order to improve the following deficits and impairments:  Abnormal gait, Improper body mechanics, Impaired sensation, Increased fascial restricitons, Decreased coordination, Decreased scar mobility, Decreased mobility, Postural dysfunction, Decreased range of motion, Decreased endurance, Decreased safety awareness  Visit Diagnosis: Dysphonia  Other abnormalities of gait and mobility  Myalgia     Problem List Patient Active Problem List   Diagnosis Date Noted  . Upper airway cough syndrome 07/29/2014  . Chest pain, atypical 04/09/2013  . TIA (transient ischemic attack) 03/25/2013  . Aortic stenosis 01/02/2013  . CAD (coronary artery disease), native coronary artery 01/02/2013  . MGUS (monoclonal gammopathy of unknown significance) 03/25/2011  . Lung nodules 08/30/2010  . ABDOMINAL PAIN 11/30/2009  . COUGH, CHRONIC 10/23/2009  . PERIPHERAL NEUROPATHY, FEET 09/16/2009  . ANXIETY, SITUATIONAL 01/28/2009  . FACIAL PAIN 01/28/2009  . POSTMENOPAUSAL STATUS 06/11/2008  . Vaginitis and vulvovaginitis, unspecified 05/14/2008  . GERD 03/26/2008  . FREQUENCY, URINARY 03/26/2008  . CARPAL TUNNEL SYNDROME, BILATERAL 01/21/2008  . OSTEOARTHRITIS, HANDS, BILATERAL 01/21/2008  .  DEGENERATIVE DISC DISEASE, CERVICAL SPINE 01/21/2008  . B12 DEFICIENCY 12/04/2007  . Hyperlipidemia 12/04/2007  . Essential hypertension, benign 12/04/2007  . Allergic rhinitis 12/04/2007  . DIVERTICULOSIS OF COLON 12/04/2007  . IRRITABLE BOWEL SYNDROME 12/04/2007  . FIBROMYALGIA 12/04/2007    Jerl Mina ,PT, DPT, E-RYT  11/12/2015, 4:01 PM  [place] MAIN [facility] SERVICES [address] [place], [place], [address] Phone: [phone]   Fax:  [phone]  Name: Erin Hunt MRN: [id number] Date of Birth: 1928-02-03

## 2015-11-12 NOTE — Patient Instructions (Signed)
Decrease hump back, improve upright posture to stack diaphragm over pelvis, throat over diaphragm midback extensions when seated 5 reps x 3 day  (chest lift, shoulder blades squeezing back)   Discontinue the exercise where you touch your toes and roll up   Pelvic floor strengthening PELVIC FLOOR / KEGEL EXERCISES   Pelvic floor/ Kegel exercises are used to strengthen the muscles in the base of your pelvis that are responsible for supporting your pelvic organs and preventing urine/feces leakage. Based on your therapist's recommendations, they can be performed while standing, sitting, or lying down. Imagine pelvic floor area as a diamond with pelvic landmarks: top =pubic bone, bottom tip=tailbone, sides=sitting bones (ischial tuberosities).    Make yourself aware of this muscle group by using these cues while coordinating your breath:  Inhale, feel pelvic floor diamond area lower like hammock towards your feet and ribcage/belly expanding. Pause. Let the exhale naturally and feel your belly sink, abdominal muscles hugging in around you and you may notice the pelvic diamond draws upward towards your head forming a umbrella shape. Give a squeeze during the exhalation like you are stopping the flow of urine. If you are squeezing the buttock muscles, try to give 50% less effort.   Common Errors:  Breath holding: If you are holding your breath, you may be bearing down against your bladder instead of pulling it up. If you belly bulges up while you are squeezing, you are holding your breath. Be sure to breathe gently in and out while exercising. Counting out loud may help you avoid holding your breath.  Accessory muscle use: You should not see or feel other muscle movement when performing pelvic floor exercises. When done properly, no one can tell that you are performing the exercises. Keep the buttocks, belly and inner thighs relaxed.  Overdoing it: Your muscles can fatigue and stop working for you if  you over-exercise. You may actually leak more or feel soreness at the lower abdomen or rectum.  YOUR HOME EXERCISE PROGRAM  LONG HOLDS: Position: on back  Inhale and then exhale. Then squeeze the muscle and count aloud for 3 seconds. Rest with three long breaths. (Be sure to let belly sink in with exhales and not push outward)  Perform 3 repetitions, 3 times/day  SHORT HOLDS: Position: on back, sitting   Inhale and then exhale. Then squeeze the muscle.  (Be sure to let belly sink in with exhales and not push outward)  Perform 3 repetitions, 3  Times/day                      DECREASE DOWNWARD PRESSURE ON  YOUR PELVIC FLOOR, ABDOMINAL, LOW BACK MUSCLES       PRESERVE YOUR PELVIC HEALTH LONG-TERM   ** SQUEEZE pelvic floor BEFORE YOUR SNEEZE, COUGH, LAUGH   ** EXHALE BEFORE YOU RISE AGAINST GRAVITY (lifting, sit to stand, from squat to stand)   ** LOG ROLL OUT OF BED INSTEAD OF CRUNCH/SIT-UP

## 2015-11-13 ENCOUNTER — Ambulatory Visit: Payer: Medicare Other | Admitting: Urology

## 2015-11-20 ENCOUNTER — Ambulatory Visit: Payer: Medicare Other | Admitting: Urology

## 2015-11-20 ENCOUNTER — Ambulatory Visit: Payer: Medicare Other | Admitting: Speech Pathology

## 2015-11-20 ENCOUNTER — Encounter: Payer: Medicare Other | Admitting: Physical Therapy

## 2015-11-23 ENCOUNTER — Ambulatory Visit: Payer: Medicare Other | Admitting: Physical Therapy

## 2015-12-01 ENCOUNTER — Encounter: Payer: Self-pay | Admitting: Urology

## 2015-12-01 ENCOUNTER — Ambulatory Visit (INDEPENDENT_AMBULATORY_CARE_PROVIDER_SITE_OTHER): Payer: Medicare Other | Admitting: Urology

## 2015-12-01 VITALS — BP 162/72 | HR 68 | Ht 63.0 in | Wt 135.2 lb

## 2015-12-01 DIAGNOSIS — N3941 Urge incontinence: Secondary | ICD-10-CM | POA: Diagnosis not present

## 2015-12-01 DIAGNOSIS — I2510 Atherosclerotic heart disease of native coronary artery without angina pectoris: Secondary | ICD-10-CM | POA: Diagnosis not present

## 2015-12-01 DIAGNOSIS — N3281 Overactive bladder: Secondary | ICD-10-CM | POA: Diagnosis not present

## 2015-12-01 DIAGNOSIS — K5900 Constipation, unspecified: Secondary | ICD-10-CM

## 2015-12-01 DIAGNOSIS — N393 Stress incontinence (female) (male): Secondary | ICD-10-CM | POA: Diagnosis not present

## 2015-12-01 LAB — BLADDER SCAN AMB NON-IMAGING: Scan Result: 0

## 2015-12-01 NOTE — Progress Notes (Signed)
12/01/2015 7:16 PM   Erin Hunt [birth year] [id number]  Referring provider: Perrin Maltese, MD [address] [place], [place] [address]  Chief Complaint  Patient presents with  . Urinary Incontinence    HPI: 82 yo F with mixed urinary incontinence returns today for routine follow-up.  Overall, she's doing quite well.  Since her last visit, she's been on Mybetriq 50 mg with an excellent response. She no longer has episodes of urgency and urge incontinence. She is quite pleased with the result. Postvoid residual today is minimal. No other side effects of the medication. This is affordable for her.  She has also been seen and evaluated by physical therapy and has started doing [place] exercises. She states she is have some stress incontinence but this is improving.  She continues to have alternating episodes of loose stool and constipation. She states that she prefer to be constipated As when she has loose stool, she will leak. She occasionally takes MiraLAX as needed.  She does have a known history of rectocele and will occasionally vaginally splint to evacuate her stool. She is previously declined pessary placement.  She notes that when she is constipated, her urinary symptoms are worse.  She is accompanied today by her daughter.   PMH: Past Medical History:  Diagnosis Date  . Allergic rhinitis   . Anemia   . Arthritis   . CAD (coronary artery disease)   . Carpal tunnel syndrome   . Cervical spine degeneration   . Diverticulosis   . Fibromyalgia   . GERD (gastroesophageal reflux disease)   . Hypertension   . IBS (irritable bowel syndrome)   . IgG gammopathy   . LBBB (left bundle branch block)   . PONV (postoperative nausea and vomiting)   . Vitamin B12 deficiency     Surgical History: Past Surgical History:  Procedure Laterality Date  . BREAST LUMPECTOMY WITH RADIOACTIVE SEED LOCALIZATION Left 06/25/2014   Procedure: LEFT BREAST LUMPECTOMY WITH RADIOACTIVE SEED  LOCALIZATION;  Surgeon: Coralie Keens, MD;  Location: [facility];  Service: General;  Laterality: Left;  . CARDIAC CATHETERIZATION    . CHOLECYSTECTOMY    . TSA    . VAGINAL HYSTERECTOMY     L ovary removed  . VESICOVAGINAL FISTULA CLOSURE W/ TAH      Home Medications:    Medication List       Accurate as of 12/01/15  7:16 PM. Always use your most recent med list.          ALIGN PO Take 1 tablet by mouth at bedtime.   amLODipine 10 MG tablet Commonly known as:  NORVASC Take 1 tablet (10 mg total) by mouth daily.   b complex vitamins tablet Take 1 tablet by mouth daily.   BIOTIN PO Take 1,000 mg by mouth daily at 12 noon.   bisoprolol 5 MG tablet Commonly known as:  ZEBETA TAKE HALF A TABLET (2.5 MG) BY MOUTH DAILY   celecoxib 200 MG capsule Commonly known as:  CELEBREX Take 200 mg by mouth as needed (joint pain).   COQ10 PO Take 1 tablet by mouth daily at 12 noon.   cyclobenzaprine 10 MG tablet Commonly known as:  FLEXERIL Take 10 mg by mouth as needed.   estropipate 1.5 MG tablet Commonly known as:  OGEN Take 1.5 mg by mouth every morning.   hyoscyamine 0.125 MG SL tablet Commonly known as:  LEVSIN SL Take 2 tablets by mouth twice daily as needed for  stomach cramps   ipratropium 0.06 % nasal spray Commonly known as:  ATROVENT Place 2 sprays into both nostrils 2 (two) times daily.   magnesium gluconate 500 MG tablet Commonly known as:  MAGONATE Take 500 mg by mouth daily.   Melatonin 1 MG Tabs Take by mouth. Use as needed per bottle for insomnia   mirabegron ER 50 MG Tb24 tablet Commonly known as:  MYRBETRIQ Take 1 tablet (50 mg total) by mouth daily.   NON FORMULARY Ultima electrolyte powder Take 1 tsp by mouth three times a day   VITAMIN D PO Take 1 tablet by mouth daily.   XIIDRA 5 % Soln Generic drug:  Lifitegrast Take 1 drop by mouth 2 (two) times daily.       Allergies:  Allergies  Allergen Reactions  .  Aspirin Other (See Comments)    Ecchymosis with high doses  . Codeine Sulfate Nausea Only  . Erythromycin Nausea And Vomiting  . Sulfa Antibiotics Nausea Only  . Sulfonamide Derivatives Nausea Only    Family History: Family History  Problem Relation Age of Onset  . Diverticulosis Mother   . Coronary artery disease Father   . Heart disease Paternal Aunt   . Heart disease Paternal Uncle   . Breast cancer Paternal Aunt   . Prostate cancer Brother   . Leukemia Brother   . Heart attack Brother   . Hypertension Brother   . Stroke Brother     Social History:  reports that she has never smoked. She has never used smokeless tobacco. She reports that she does not drink alcohol or use drugs.  ROS: UROLOGY Frequent Urination?: Yes Hard to postpone urination?: No Burning/pain with urination?: No Get up at night to urinate?: No Leakage of urine?: Yes Urine stream starts and stops?: Yes Trouble starting stream?: No Do you have to strain to urinate?: No Blood in urine?: No Urinary tract infection?: No Sexually transmitted disease?: No Injury to kidneys or bladder?: No Painful intercourse?: No Weak stream?: No Currently pregnant?: No Vaginal bleeding?: No Last menstrual period?: n  Gastrointestinal Nausea?: No Vomiting?: No Indigestion/heartburn?: No Diarrhea?: No Constipation?: Yes  Constitutional Fever: No Night sweats?: No Weight loss?: No Fatigue?: Yes  Skin Skin rash/lesions?: No Itching?: Yes  Eyes Blurred vision?: No Double vision?: No  Ears/Nose/Throat Sore throat?: No Sinus problems?: Yes  Hematologic/Lymphatic Swollen glands?: No Easy bruising?: No  Cardiovascular Leg swelling?: No Chest pain?: No  Respiratory Cough?: Yes Shortness of breath?: No  Endocrine Excessive thirst?: No  Musculoskeletal Back pain?: No Joint pain?: Yes  Neurological Headaches?: No Dizziness?: No  Psychologic Depression?: No Anxiety?: No  Physical  Exam: BP (!) 162/72   Pulse 68   Ht 5\' 3"  (1.6 m)   Wt 135 lb 3.2 oz (61.3 kg)   BMI 23.95 kg/m   Constitutional:  Alert and oriented, No acute distress.  Presents with daughter today. HEENT: [place] AT, moist mucus membranes.  Trachea midline, no masses. Cardiovascular: No clubbing, cyanosis, or edema. Respiratory: Normal respiratory effort, no increased work of breathing. Skin: No rashes, bruises or suspicious lesions. Neurologic: Grossly intact, no focal deficits, moving all 4 extremities. Psychiatric: Normal mood and affect.  Laboratory Data: Lab Results  Component Value Date   WBC 8.2 11/26/2014   HGB 13.8 11/26/2014   HCT 41.5 11/26/2014   MCV 84.6 11/26/2014   PLT 325.0 11/26/2014    Lab Results  Component Value Date   CREATININE 0.86 06/18/2015     Lab  Results  Component Value Date   HGBA1C 5.8 (H) 03/26/2013    Urinalysis Results for orders placed or performed in visit on 12/01/15  Bladder Scan (Post Void Residual) in office  Result Value Ref Range   Scan Result 0    Assessment & Plan:    1. Urinary incontinence, urge/ stress incontinence/ OAB Doing well with Mybetriq 50 mg daily Adequate bladder emptying Continue this medication indefinitely Patient was advised to return if she notes of the medication is no longer helping her   2. Stress incontinence, female Continue physical therapy, improving  3. Constipation/ fecal incontinence We discussed the importance of avoidance of constipation however relates to urinary symptoms With episodes of fecal incontinence, have concern for paresis Recommend daily Colace versus Metamucil for stool softening Splint vaginally to evacuate stool as needed in the setting of rectocele  No Follow-up on file.  Hollice Espy, MD  [facility] [address] [place], [place] [address] [phone]

## 2015-12-04 ENCOUNTER — Ambulatory Visit: Payer: Medicare Other | Attending: Urology | Admitting: Physical Therapy

## 2015-12-04 DIAGNOSIS — M791 Myalgia, unspecified site: Secondary | ICD-10-CM

## 2015-12-04 DIAGNOSIS — R2689 Other abnormalities of gait and mobility: Secondary | ICD-10-CM | POA: Diagnosis present

## 2015-12-04 DIAGNOSIS — R278 Other lack of coordination: Secondary | ICD-10-CM | POA: Diagnosis present

## 2015-12-04 NOTE — Patient Instructions (Addendum)
Breathing without rushing  Count inhale 1-2- pause,  Exhale 2-1 pause   ________  Incorporate 3 rest breaths between pelvic floor holds.  Progress to 7 sec and 6 reps  3 x day    ________  Erin Hunt are now ready to begin training the deep core muscles system: diaphragm, transverse abdominis, pelvic floor . These muscles must work together as a team.           The key to these exercises to train the brain to coordinate the timing of these muscles and to have them turn on for long periods of time to hold you upright against gravity (especially important if you are on your feet all day).These muscles are postural muscles and play a role stabilizing your spine and bodyweight. By doing these repetitions slowly and correctly instead of doing crunches, you will achieve a flatter belly without a lower pooch. You are also placing your spine in a more neutral position and breathing properly which in turn, decreases your risk for problems related to your pelvic floor, abdominal, and low back such as pelvic organ prolapse, hernias, diastasis recti (separation of superficial muscles), disk herniations, spinal fractures. These exercises set a solid foundation for you to later progress to resistance/ strength training with therabands and weights and return to other typical fitness exercises with a stronger deeper core.   Do level 1 : 10 reps Do level 2: 10 reps (left and right = 1 rep) x 3 sets , 2 x day Do not progress to level 3 for 3-4 weeks. You know you are ready when you do not have any rocking of pelvis nor arching in your back      Avoid forward bending and rolling exercise  Try 10 reps of mini squat with knees behind toes and exhale up with chest lift looking forward   _______  Use this mini squat for picking things up and lifting   ________  Exhale on exertion when using weights    ________ Erin Hunt out of bed

## 2015-12-04 NOTE — Therapy (Signed)
[place] MAIN [facility] SERVICES [address] [place], [place], [address] Phone: [phone]   Fax:  [phone]  Physical Therapy Treatment  Patient Details  Name: Erin Hunt MRN: [id number] Date of Birth: [birth year] Referring Provider: Dr. Erlene Quan   Encounter Date: 12/04/2015      PT End of Session - 12/04/15 1141    Visit Number 3   Number of Visits 12   Date for PT Re-Evaluation January 24, 2016   Authorization Type g codes   PT Start Time 1110   PT Stop Time 1200   PT Time Calculation (min) 50 min   Activity Tolerance Patient tolerated treatment well;No increased pain   Behavior During Therapy WFL for tasks assessed/performed      Past Medical History:  Diagnosis Date  . Allergic rhinitis   . Anemia   . Arthritis   . CAD (coronary artery disease)   . Carpal tunnel syndrome   . Cervical spine degeneration   . Diverticulosis   . Fibromyalgia   . GERD (gastroesophageal reflux disease)   . Hypertension   . IBS (irritable bowel syndrome)   . IgG gammopathy   . LBBB (left bundle branch block)   . PONV (postoperative nausea and vomiting)   . Vitamin B12 deficiency     Past Surgical History:  Procedure Laterality Date  . BREAST LUMPECTOMY WITH RADIOACTIVE SEED LOCALIZATION Left 06/25/2014   Procedure: LEFT BREAST LUMPECTOMY WITH RADIOACTIVE SEED LOCALIZATION;  Surgeon: Coralie Keens, MD;  Location: [place];  Service: General;  Laterality: Left;  . CARDIAC CATHETERIZATION    . CHOLECYSTECTOMY    . TSA    . VAGINAL HYSTERECTOMY     L ovary removed  . VESICOVAGINAL FISTULA CLOSURE W/ TAH      There were no vitals filed for this visit.      Subjective Assessment - 12/04/15 1113    Subjective Pt reports propping her feet up and breathing with bowel movements. Pt reports she has been practicing her pelvic floor exercises. Pt saw her urologist and her test showed she was able to empty her urine completely.     Pertinent History 4 vaginal deliveries. stitches with every delivery. Take a estrogen pill for menopause.  Has not worked with a nutritionist for IBS. Pt eats "healthy" with veggies and fruit. Daily fluid intake: 6 cups of water, no sodas, 3 cups of coffee, 2 cups of green tea.  Workout: total gym. includes 5-6 sit-ups , standing toe touches 10 reps,  Gardening/ yard work: mowed and pick up sticks.  Pt has silent GERD and her breathing has limited her from gardening as much.             [facility] PT Assessment - 12/04/15 1138      Coordination   Gross Motor Movements are Fluid and Coordinated --  deep core level 2 required cuing to pause before exhale   Coordination and Movement Description short breaths, requried cuing for less effort in chest and a count of 1-2-pause      Squat   Comments anterior COM     Ambulation/Gait   Gait Comments forward head, short strides                   Pelvic Floor Special Questions - 12/04/15 1137    Pelvic Floor Internal Exam pt consented verbally and had no contraindications. Pt completed her antibiotics from a UTI   Exam Type Vaginal   Palpation  circumferential contraction timing correct. required cuing for 3 rest breaths to lengthen pelvic floor    Strength fair squeeze, definite lift   Strength # of reps 6   Strength # of seconds 7           [facility] Adult PT Treatment/Exercise - 12/04/15 1139      Therapeutic Activites    Therapeutic Activities --  see pt instructions     Neuro Re-ed    Neuro Re-ed Details  see pt instructions  gait training for upright posture                PT Education - 12/04/15 1141    Education provided Yes   Education Details HEP, education on lengthened breathing to faciliate lengthenign of pelvic floor which will help with pelvic floor strength and comletely emptying urine to minimize UTIs   Person(s) Educated Patient   Methods Explanation;Demonstration;Tactile cues;Verbal cues;Handout    Comprehension Returned demonstration;Verbalized understanding             PT Long Term Goals - 12/04/15 1211      PT LONG TERM GOAL #1   Title Pt will decrease her pad wear from 2 x/ day to < 1 x/ day in order to improve QOL   Time 12   Period Weeks   Status On-going     PT LONG TERM GOAL #2   Title Pt will improve her gait speed from .88 m/s to > 1.0 m/s in order to ambulate safely in community   Time 12   Period Weeks   Status On-going     PT LONG TERM GOAL #3   Title Pt will demo IND with modifications to fitness exercises that do not place load on her pelvic floor mm in order to improve constipation and urinary issues.   Time 12   Period Weeks   Status On-going     PT LONG TERM GOAL #4   Title Pt will report having Type 3-4 stool consistency 50% of the time during a week span in order to improve bowel function   Baseline 12   Period Weeks   Status Achieved     PT LONG TERM GOAL #5   Title Pt will demo decreased scar restrictions in perineal area in order to promote function of pelvic floor   Time 12   Period Weeks   Status Achieved     PT LONG TERM GOAL #6   Title Pt will decrease her score on PFDI score from  to <  % in order to demo improved ADLs   Period Weeks   Status On-going     PT LONG TERM GOAL #7   Title Pt will report decreased straining 100% of the time with bowl movements to minimize strain on the pelvic floor   Time 12   Period Weeks   Status Achieved               Plan - 12/04/15 1209    Clinical Impression Statement Pt is showing improved pelvic floor coordination and endurance but pt required cuing to on breathing technique (lengthened breath for full pelvic floor lengthening) and rest period to faciliate ROM of pelvic floor between endurance contractions. Pt required cuing for more upright posture and gait mechanics which PT will continue address at next session. Modified pt's self-selected exercises to optimize pelvic floor health.  Pt demo'd correctly after training. Pt will cotninue to benefit from skilled PT.    Rehab Potential  Good   Clinical Impairments Affecting Rehab Potential performed sit -ups and spinal flexions as part of her fitness routine, Hx of perineal stitches with all 4 vaginal deliveries   PT Frequency 1x / week   PT Duration 12 weeks   PT Treatment/Interventions ADLs/Self Care Home Management;Aquatic Therapy;Biofeedback;Lobbyist;Therapeutic exercise;Therapeutic activities;Functional mobility training;Stair training;Gait training;Neuromuscular re-education;Patient/family education;Scar mobilization;Manual lymph drainage;Manual techniques;Passive range of motion;Energy conservation;Taping      Patient will benefit from skilled therapeutic intervention in order to improve the following deficits and impairments:     Visit Diagnosis: Other abnormalities of gait and mobility  Myalgia  Other lack of coordination     Problem List Patient Active Problem List   Diagnosis Date Noted  . Arthritis 10/26/2015  . Hypertension 10/26/2015  . Dysphonia 10/05/2015  . Pain in throat 10/05/2015  . Encounter for fitting or adjustment of hearing aid 04/28/2015  . Chronic rhinitis 10/28/2014  . Sensorineural hearing loss of both ears 08/29/2014  . Postnasal drip 07/29/2014  . Atypical chest pain 04/09/2013  . Temporary cerebral vascular dysfunction 03/25/2013  . Aortic valve stenosis 01/02/2013  . Coronary arteriosclerosis in native artery 01/02/2013  . Monoclonal gammopathy of unknown significance 03/25/2011  . Multiple pulmonary nodules 08/30/2010  . ABDOMINAL PAIN 11/30/2009  . COUGH, CHRONIC 10/23/2009  . Hereditary and idiopathic neuropathy 09/16/2009  . Acute stress disorder 01/28/2009  . FACIAL PAIN 01/28/2009  . Headache 01/28/2009  . POSTMENOPAUSAL STATUS 06/11/2008  . Post menopausal syndrome 06/11/2008  . Vaginitis and vulvovaginitis 05/14/2008  .  Gastroesophageal reflux disease 03/26/2008  . Increased frequency of urination 03/26/2008  . CARPAL TUNNEL SYNDROME, BILATERAL 01/21/2008  . Osteoarthritis of hand 01/21/2008  . Degeneration of intervertebral disc of cervical region 01/21/2008  . Vitamin B-complex deficiency 12/04/2007  . Hyperlipidemia 12/04/2007  . Benign essential hypertension 12/04/2007  . Allergic rhinitis 12/04/2007  . DIVERTICULOSIS OF COLON 12/04/2007  . Irritable bowel syndrome 12/04/2007  . Muscle pain 12/04/2007  . Diverticulosis of large intestine without perforation or abscess without bleeding 12/04/2007    Jerl Mina ,PT, DPT, E-RYT  12/04/2015, 12:14 PM  [place] MAIN [facility] SERVICES [address] [place], [place], [address] Phone: [phone]   Fax:  [phone]  Name: Erin Hunt MRN: [id number] Date of Birth: 07/21/1928

## 2015-12-16 ENCOUNTER — Ambulatory Visit: Payer: Medicare Other | Admitting: Physical Therapy

## 2015-12-22 ENCOUNTER — Ambulatory Visit: Payer: Medicare Other | Admitting: Physical Therapy

## 2016-01-05 ENCOUNTER — Encounter: Payer: Medicare Other | Admitting: Physical Therapy

## 2016-01-14 ENCOUNTER — Ambulatory Visit: Payer: Medicare Other | Attending: Urology | Admitting: Physical Therapy

## 2016-01-14 DIAGNOSIS — R2689 Other abnormalities of gait and mobility: Secondary | ICD-10-CM | POA: Diagnosis not present

## 2016-01-14 DIAGNOSIS — R278 Other lack of coordination: Secondary | ICD-10-CM | POA: Diagnosis present

## 2016-01-14 DIAGNOSIS — M791 Myalgia, unspecified site: Secondary | ICD-10-CM

## 2016-01-14 DIAGNOSIS — R490 Dysphonia: Secondary | ICD-10-CM | POA: Insufficient documentation

## 2016-01-14 NOTE — Patient Instructions (Addendum)
Back stretches:  Knee to chest with a towel under the thighs Exhale to pull closer to chest   5 reps   ______ Find comfort in laying on back without back spasms When laying on your back, knees bent, feet hip width apart, arms pressed to the bed by your side, inhale and exhale lift hips up, lower the ribs down first then the pelvis to lengthen your back    _______ Pelvic floor  Only do pelvic floor short contractions 7 reps, 3 x day  Inhale to lengthen and then exhale to contraction. Then inhale to relax and lengthen the pelvc floor muscles again before squeezing again   QUICK SQUEEZE WHEN YOU SNEEZE OR COUGH  ________    Red band under heels while laying on back w/ knees bent  "W" exercise  10 reps x 2 sets   Band is placed under feet, knees bent, feet are hip width apart Hold band with thumbs point out, keep upper arm and elbow touching the bed the whole time  - inhale and then exhale pull bands by bending elbows hands move in a "w"  (feel shoulder blades squeezing)     10 reps x 2 sets

## 2016-01-15 NOTE — Therapy (Signed)
[place] MAIN [facility] SERVICES [address] [place], [place], [address] Phone: [phone]   Fax:  [phone]  Physical Therapy Treatment / Progress Note  Patient Details  Name: Erin Hunt MRN: [id number] Date of Birth: 04-Jan-1929 Referring Provider: Dr. Erlene Quan   Encounter Date: 2016-01-22      PT End of Session - 01/15/16 1802    Visit Number 4   Date for PT Re-Evaluation 04/16/2016   Authorization Type g codes 1  01-22-23)      Past Medical History:  Diagnosis Date  . Allergic rhinitis   . Anemia   . Arthritis   . CAD (coronary artery disease)   . Carpal tunnel syndrome   . Cervical spine degeneration   . Diverticulosis   . Fibromyalgia   . GERD (gastroesophageal reflux disease)   . Hypertension   . IBS (irritable bowel syndrome)   . IgG gammopathy   . LBBB (left bundle branch block)   . PONV (postoperative nausea and vomiting)   . Vitamin B12 deficiency     Past Surgical History:  Procedure Laterality Date  . BREAST LUMPECTOMY WITH RADIOACTIVE SEED LOCALIZATION Left 06/25/2014   Procedure: LEFT BREAST LUMPECTOMY WITH RADIOACTIVE SEED LOCALIZATION;  Surgeon: Coralie Keens, MD;  Location: [place];  Service: General;  Laterality: Left;  . CARDIAC CATHETERIZATION    . CHOLECYSTECTOMY    . TSA    . VAGINAL HYSTERECTOMY     L ovary removed  . VESICOVAGINAL FISTULA CLOSURE W/ TAH      There were no vitals filed for this visit.      Subjective Assessment - 01/22/16 1128    Subjective Pt notices she has able to make it to the bathroom in time without leakage. Pt has been practicing her HEP but finds difficulty with the sustained contractions.    Pertinent History 4 vaginal deliveries. stitches with every delivery. Take a estrogen pill for menopause.  Has not worked with a nutritionist for IBS. Pt eats "healthy" with veggies and fruit. Daily fluid intake: 6 cups of water, no sodas, 3 cups of coffee, 2 cups  of green tea.  Workout: total gym. includes 5-6 sit-ups , standing toe touches 10 reps,  Gardening/ yard work: mowed and pick up sticks.  Pt has silent GERD and her breathing has limited her from gardening as much.                       Pelvic Floor Special Questions - 01/15/16 1751    Pelvic Floor Internal Exam pt consented verbally and had no contraindications. Pt completed her antibiotics from a UTI   Exam Type Vaginal   Palpation circumferential contraction, but loss of endurance. downgraded to short contractions. coordination correct  introduced co-contraction of pelvic floor mm   Strength good squeeze, good lift, able to hold agaisnt strong resistance   Strength # of reps 7   Strength # of seconds 1           [facility] Adult PT Treatment/Exercise - 01/15/16 1750      Therapeutic Activites    Therapeutic Activities --  see pt instructions     Neuro Re-ed    Neuro Re-ed Details  se pt instructions                PT Education - 01-22-16 1128    Education provided Yes   Education Details HEP   Person(s) Educated Patient  Methods Explanation;Demonstration;Tactile cues;Verbal cues;Handout   Comprehension Returned demonstration;Verbalized understanding             PT Long Term Goals - 01/15/16 1754      PT LONG TERM GOAL #1   Title Pt will decrease her pad wear from 2 x/ day to < 1 x/ day in order to improve QOL   Time 12   Period Weeks   Status On-going     PT LONG TERM GOAL #2   Title Pt will improve her gait speed from .88 m/s to > 1.0 m/s in order to ambulate safely in community   Time 12   Period Weeks   Status On-going     PT LONG TERM GOAL #3   Title Pt will demo IND with modifications to fitness exercises that do not place load on her pelvic floor mm in order to improve constipation and urinary issues.   Time 12   Period Weeks   Status On-going     PT LONG TERM GOAL #4   Title Pt will report having Type 3-4 stool consistency 50%  of the time during a week span in order to improve bowel function   Baseline 12   Period Weeks   Status Achieved     PT LONG TERM GOAL #5   Title Pt will demo decreased scar restrictions in perineal area in order to promote function of pelvic floor   Time 12   Period Weeks   Status Achieved     PT LONG TERM GOAL #6   Title Pt will decrease her score on PFDI score from  to <  % in order to demo improved ADLs   Period Weeks   Status On-going     PT LONG TERM GOAL #7   Title Pt will report decreased straining 100% of the time with bowl movements to minimize strain on the pelvic floor   Time 12   Period Weeks   Status Achieved               Plan - 01/15/16 1756    Clinical Impression Statement Pt has achieved 3/7 goals and is progressing well towards her goals. Pt reported that pt is starting to notice she has able to make it to the bathroom in time without leakage and she has improved bowel movements without straining. Pt has achieved proper deep core coordination and pelvic floor activation after showing a more aligned pelvis and less thoracic kyphosis with manual Tx and exercises. Pt continues to benefit from skilled PT.       Rehab Potential Good   Clinical Impairments Affecting Rehab Potential performed sit -ups and spinal flexions as part of her fitness routine, Hx of perineal stitches with all 4 vaginal deliveries   PT Frequency 1x / week   PT Duration 12 weeks   PT Treatment/Interventions ADLs/Self Care Home Management;Aquatic Therapy;Biofeedback;Lobbyist;Therapeutic exercise;Therapeutic activities;Functional mobility training;Stair training;Gait training;Neuromuscular re-education;Patient/family education;Scar mobilization;Manual lymph drainage;Manual techniques;Passive range of motion;Energy conservation;Taping   Consulted and Agree with Plan of Care Patient      Patient will benefit from skilled therapeutic intervention in order to  improve the following deficits and impairments:  Abnormal gait, Improper body mechanics, Impaired sensation, Increased fascial restricitons, Decreased coordination, Decreased scar mobility, Decreased mobility, Postural dysfunction, Decreased range of motion, Decreased endurance, Decreased safety awareness  Visit Diagnosis: Other abnormalities of gait and mobility - Plan: PT plan of care cert/re-cert  Myalgia - Plan: PT plan of care  cert/re-cert  Other lack of coordination - Plan: PT plan of care cert/re-cert  Dysphonia - Plan: PT plan of care cert/re-cert       G-Codes - 2016/01/19 1802    Functional Assessment Tool Used clincial judgement    Functional Limitation Mobility: Walking and moving around   Mobility: Walking and Moving Around Current Status [phone]) At least 40 percent but less than 60 percent impaired, limited or restricted   Mobility: Walking and Moving Around Goal Status [id number]) At least 40 percent but less than 60 percent impaired, limited or restricted      Problem List Patient Active Problem List   Diagnosis Date Noted  . Arthritis 10/26/2015  . Hypertension 10/26/2015  . Dysphonia 10/05/2015  . Pain in throat 10/05/2015  . Encounter for fitting or adjustment of hearing aid 04/28/2015  . Chronic rhinitis 10/28/2014  . Sensorineural hearing loss of both ears 08/29/2014  . Postnasal drip 07/29/2014  . Atypical chest pain 04/09/2013  . Temporary cerebral vascular dysfunction 03/25/2013  . Aortic valve stenosis 01/02/2013  . Coronary arteriosclerosis in native artery 01/02/2013  . Monoclonal gammopathy of unknown significance 03/25/2011  . Multiple pulmonary nodules 08/30/2010  . ABDOMINAL PAIN 11/30/2009  . COUGH, CHRONIC 10/23/2009  . Hereditary and idiopathic neuropathy 09/16/2009  . Acute stress disorder 01/28/2009  . FACIAL PAIN 01/28/2009  . Headache 01/28/2009  . POSTMENOPAUSAL STATUS 06/11/2008  . Post menopausal syndrome 06/11/2008  . Vaginitis and  vulvovaginitis 05/14/2008  . Gastroesophageal reflux disease 03/26/2008  . Increased frequency of urination 03/26/2008  . CARPAL TUNNEL SYNDROME, BILATERAL 01/21/2008  . Osteoarthritis of hand 01/21/2008  . Degeneration of intervertebral disc of cervical region 01/21/2008  . Vitamin B-complex deficiency 12/04/2007  . Hyperlipidemia 12/04/2007  . Benign essential hypertension 12/04/2007  . Allergic rhinitis 12/04/2007  . DIVERTICULOSIS OF COLON 12/04/2007  . Irritable bowel syndrome 12/04/2007  . Muscle pain 12/04/2007  . Diverticulosis of large intestine without perforation or abscess without bleeding 12/04/2007    Jerl Mina ,PT, DPT, E-RYT  2016/01/19, 6:03 PM  [place] MAIN [facility] SERVICES [address] [place], [place], [address] Phone: [phone]   Fax:  [phone]  Name: Erin Hunt MRN: [id number] Date of Birth: Mar 14, 1928

## 2016-02-18 ENCOUNTER — Other Ambulatory Visit: Payer: Self-pay | Admitting: Internal Medicine

## 2016-02-23 ENCOUNTER — Ambulatory Visit: Payer: Medicare Other | Attending: Urology | Admitting: Physical Therapy

## 2016-02-23 DIAGNOSIS — M791 Myalgia, unspecified site: Secondary | ICD-10-CM

## 2016-02-23 DIAGNOSIS — R2689 Other abnormalities of gait and mobility: Secondary | ICD-10-CM | POA: Diagnosis not present

## 2016-02-23 DIAGNOSIS — R278 Other lack of coordination: Secondary | ICD-10-CM | POA: Diagnosis present

## 2016-02-23 NOTE — Patient Instructions (Signed)
Pectoralis stretch by wall (elbows bent)  5 breaths each side  Shoulder blade squeezes and chin tuck like wall is behind you 3 sec holds, 10 reps   Pelvic Floor  Quick holds:  5 x 5 x 5 day      Long holds:  For 3rd week -5 weeks, 5  sec  hold x 5 reps x 5 times a day   On 6-8 weeks, 6  sec holds, x 5 reps x 5 times a day'  Gradually progress

## 2016-02-23 NOTE — Therapy (Addendum)
[place] MAIN [facility] SERVICES [address] [place], [place], [address] Phone: [phone]   Fax:  [phone]  Physical Therapy Treatment / Discharge Summary   Patient Details  Name: Erin Hunt MRN: [id number] Date of Birth: Feb 25, 1928 Referring Provider: Dr. Erlene Quan   Encounter Date: 02/23/2016      PT End of Session - 02/23/16 1516    Visit Number 5   Number of Visits 12   Date for PT Re-Evaluation 04/09/2016   Authorization Type g codes 2  Jan 15, 2023)   PT Start Time [id number]   PT Stop Time 1430   PT Time Calculation (min) 25 min   Activity Tolerance Patient tolerated treatment well;No increased pain      Past Medical History:  Diagnosis Date  . Allergic rhinitis   . Anemia   . Arthritis   . CAD (coronary artery disease)   . Carpal tunnel syndrome   . Cervical spine degeneration   . Diverticulosis   . Fibromyalgia   . GERD (gastroesophageal reflux disease)   . Hypertension   . IBS (irritable bowel syndrome)   . IgG gammopathy   . LBBB (left bundle branch block)   . PONV (postoperative nausea and vomiting)   . Vitamin B12 deficiency     Past Surgical History:  Procedure Laterality Date  . BREAST LUMPECTOMY WITH RADIOACTIVE SEED LOCALIZATION Left 06/25/2014   Procedure: LEFT BREAST LUMPECTOMY WITH RADIOACTIVE SEED LOCALIZATION;  Surgeon: Coralie Keens, MD;  Location: [facility];  Service: General;  Laterality: Left;  . CARDIAC CATHETERIZATION    . CHOLECYSTECTOMY    . TSA    . VAGINAL HYSTERECTOMY     L ovary removed  . VESICOVAGINAL FISTULA CLOSURE W/ TAH      There were no vitals filed for this visit.      Subjective Assessment - 02/23/16 1443    Subjective Pt reported last week she started to no wear a pad            [facility] PT Assessment - 02/23/16 1505      Ambulation/Gait   Gait Pattern --  increased trunk sway, increased flexibility   Gait Comments 1.3 m/s                       [facility] Adult PT Treatment/Exercise - 02/23/16 1505      Therapeutic Activites    Therapeutic Activities --  reassessed goals, discussed d/c                 PT Education - 02/23/16 1504    Education Details d/c   Person(s) Educated Patient   Methods Explanation;Demonstration;Tactile cues;Verbal cues;Handout   Comprehension Returned demonstration;Verbalized understanding             PT Long Term Goals - 02/23/16 1444      PT LONG TERM GOAL #1   Title Pt will decrease her pad wear from 2 x/ day to < 1 x/ day in order to improve QOL (1/30: 0 pads)    Time 12   Period Weeks   Status Achieved     PT LONG TERM GOAL #2   Title Pt will improve her gait speed from .88 m/s to > 1.0 m/s in order to ambulate safely in community  (1/30: 1.3 M/S)    Time 12   Period Weeks   Status On-going     PT LONG TERM GOAL #3   Title Pt  will demo IND with modifications to fitness exercises that do not place load on her pelvic floor mm in order to improve constipation and urinary issues.   Time 12   Period Weeks   Status Achieved     PT LONG TERM GOAL #4   Title Pt will report having Type 3-4 stool consistency 50% of the time during a week span in order to improve bowel function   Baseline 12   Period Weeks   Status Achieved     PT LONG TERM GOAL #5   Title Pt will demo decreased scar restrictions in perineal area in order to promote function of pelvic floor   Time 12   Period Weeks   Status Achieved     PT LONG TERM GOAL #6   Title Pt will decrease her score on PFDI score from  to <  % in order to demo improved ADLs   Period Weeks   Status Unable to assess     PT LONG TERM GOAL #7   Title Pt will report decreased straining 100% of the time with bowl movements to minimize strain on the pelvic floor   Time 12   Period Weeks   Status Achieved               Plan - 07-Mar-2016 1516    Clinical Impression Statement Pt has achieved 100% of  her goals. After 5 visits , pt reports she is no longer wearing pads and is having better bowel movements. Based on the [facility] scale, pt states her incontinence and bowel movements have improved "a great deal better" and her LBP has improved "moderately" better.   Pt demo'd improved pelvic floor and deep core coordination along with increased gait and flexibility of her spine and hips. Pt is ready for discharge today.    Rehab Potential Good   Clinical Impairments Affecting Rehab Potential performed sit -ups and spinal flexions as part of her fitness routine, Hx of perineal stitches with all 4 vaginal deliveries   PT Frequency 1x / week   PT Duration 12 weeks   PT Treatment/Interventions ADLs/Self Care Home Management;Aquatic Therapy;Biofeedback;Lobbyist;Therapeutic exercise;Therapeutic activities;Functional mobility training;Stair training;Gait training;Neuromuscular re-education;Patient/family education;Scar mobilization;Manual lymph drainage;Manual techniques;Passive range of motion;Energy conservation;Taping   Consulted and Agree with Plan of Care Patient      Patient will benefit from skilled therapeutic intervention in order to improve the following deficits and impairments:  Abnormal gait, Improper body mechanics, Impaired sensation, Increased fascial restricitons, Decreased coordination, Decreased scar mobility, Decreased mobility, Postural dysfunction, Decreased range of motion, Decreased endurance, Decreased safety awareness  Visit Diagnosis: Other abnormalities of gait and mobility  Myalgia  Other lack of coordination       G-Codes - 03/07/2016 1446    Functional Assessment Tool Used clincial judgement , walking speed   Functional Limitation Mobility: Walking and moving around   Mobility: Walking and Moving Around Current Status [phone]) At least 1 percent but less than 20 percent impaired, limited or restricted   Mobility: Walking and Moving Around Goal  Status [phone]) At least 1 percent but less than 20 percent impaired, limited or restricted   Mobility: Walking and Moving Around Discharge Status [phone]) At least 1 percent but less than 20 percent impaired, limited or restricted      Problem List Patient Active Problem List   Diagnosis Date Noted  . Arthritis 10/26/2015  . Hypertension 10/26/2015  . Dysphonia 10/05/2015  . Pain in throat 10/05/2015  .  Encounter for fitting or adjustment of hearing aid 04/28/2015  . Chronic rhinitis 10/28/2014  . Sensorineural hearing loss of both ears 08/29/2014  . Postnasal drip 07/29/2014  . Atypical chest pain 04/09/2013  . Temporary cerebral vascular dysfunction 03/25/2013  . Aortic valve stenosis 01/02/2013  . Coronary arteriosclerosis in native artery 01/02/2013  . Monoclonal gammopathy of unknown significance 03/25/2011  . Multiple pulmonary nodules 08/30/2010  . ABDOMINAL PAIN 11/30/2009  . COUGH, CHRONIC 10/23/2009  . Hereditary and idiopathic neuropathy 09/16/2009  . Acute stress disorder 01/28/2009  . FACIAL PAIN 01/28/2009  . Headache 01/28/2009  . POSTMENOPAUSAL STATUS 06/11/2008  . Post menopausal syndrome 06/11/2008  . Vaginitis and vulvovaginitis 05/14/2008  . Gastroesophageal reflux disease 03/26/2008  . Increased frequency of urination 03/26/2008  . CARPAL TUNNEL SYNDROME, BILATERAL 01/21/2008  . Osteoarthritis of hand 01/21/2008  . Degeneration of intervertebral disc of cervical region 01/21/2008  . Vitamin B-complex deficiency 12/04/2007  . Hyperlipidemia 12/04/2007  . Benign essential hypertension 12/04/2007  . Allergic rhinitis 12/04/2007  . DIVERTICULOSIS OF COLON 12/04/2007  . Irritable bowel syndrome 12/04/2007  . Muscle pain 12/04/2007  . Diverticulosis of large intestine without perforation or abscess without bleeding 12/04/2007    Jerl Mina ,PT, DPT, E-RYT  02/23/2016, 3:33 PM  [place] MAIN [facility] SERVICES [address] [place], [place], [address] Phone: [phone]   Fax:  [phone]  Name: OLLIE MCDAID MRN: [id number] Date of Birth: [birth year]

## 2016-04-25 ENCOUNTER — Other Ambulatory Visit: Payer: Self-pay | Admitting: Internal Medicine

## 2016-05-16 ENCOUNTER — Other Ambulatory Visit: Payer: Self-pay

## 2016-05-16 DIAGNOSIS — N3281 Overactive bladder: Secondary | ICD-10-CM

## 2016-05-16 MED ORDER — MIRABEGRON ER 50 MG PO TB24: 50.0000 mg | ORAL_TABLET | Freq: Every day | ORAL | 1 refills | Status: DC

## 2016-06-23 ENCOUNTER — Ambulatory Visit (INDEPENDENT_AMBULATORY_CARE_PROVIDER_SITE_OTHER): Payer: Medicare Other | Admitting: Internal Medicine

## 2016-06-23 ENCOUNTER — Encounter: Payer: Self-pay | Admitting: Internal Medicine

## 2016-06-23 VITALS — BP 156/78 | HR 88 | Ht 62.0 in | Wt 137.5 lb

## 2016-06-23 DIAGNOSIS — I350 Nonrheumatic aortic (valve) stenosis: Secondary | ICD-10-CM | POA: Diagnosis not present

## 2016-06-23 DIAGNOSIS — I951 Orthostatic hypotension: Secondary | ICD-10-CM | POA: Diagnosis not present

## 2016-06-23 DIAGNOSIS — E875 Hyperkalemia: Secondary | ICD-10-CM | POA: Diagnosis not present

## 2016-06-23 DIAGNOSIS — I10 Essential (primary) hypertension: Secondary | ICD-10-CM | POA: Diagnosis not present

## 2016-06-23 MED ORDER — BISOPROLOL FUMARATE 5 MG PO TABS: 5.0000 mg | ORAL_TABLET | Freq: Every day | ORAL | 3 refills | Status: DC

## 2016-06-23 NOTE — Progress Notes (Signed)
    Patient Care Team: Khan, Neelam S, MD as PCP - General (Internal Medicine)   HPI  Erin Hunt is a 83 y.o. female Seen in followup of orthostatic hypotension and systolic hypertension.  Seen 7/16 and discussed non pharmacological Rx, incl blocks isometric contraction, abdominal binders, and fluids   She's had problems with edema limiting up titration of amlodipine. ARB's were not tolerated because of hyperkalemia. At the last visit we added low-dose metoprolol.  Blood pressures have been well-controlled. On lower dose of amlodipine there has been no interval edema  Date Cr K  12/16 1.03 5.5  5/17 0.86 4.9     Most recent laboratory work was notable for potassium of 5.5.Her ARB was discontinued and amlodipine was started  Echocardiogram 3/15 was reviewed. DATE TEST    3/15 echo  EF normal   mild AS  6/17 Echo  EF 45-50% Mild AS (Mean G 10)   6/17 myoview EF normal No ischemia     Past Medical History:  Diagnosis Date  . Allergic rhinitis   . Anemia   . Arthritis   . CAD (coronary artery disease)   . Carpal tunnel syndrome   . Cervical spine degeneration   . Diverticulosis   . Fibromyalgia   . GERD (gastroesophageal reflux disease)   . Hypertension   . IBS (irritable bowel syndrome)   . IgG gammopathy   . LBBB (left bundle branch block)   . PONV (postoperative nausea and vomiting)   . Vitamin B12 deficiency     Past Surgical History:  Procedure Laterality Date  . BREAST LUMPECTOMY WITH RADIOACTIVE SEED LOCALIZATION Left 06/25/2014   Procedure: LEFT BREAST LUMPECTOMY WITH RADIOACTIVE SEED LOCALIZATION;  Surgeon: Douglas Blackman, MD;  Location: [facility];  Service: General;  Laterality: Left;  . CARDIAC CATHETERIZATION    . CHOLECYSTECTOMY    . TSA    . VAGINAL HYSTERECTOMY     L ovary removed  . VESICOVAGINAL FISTULA CLOSURE W/ TAH      Current Outpatient Prescriptions  Medication Sig Dispense Refill  . bisoprolol (ZEBETA) 5 MG  tablet TAKE 1/2 TABLET BY MOUTH EVERY DAY. 15 tablet 3  . celecoxib (CELEBREX) 200 MG capsule Take 200 mg by mouth as needed (joint pain).     . cetirizine (ZYRTEC) 10 MG chewable tablet Chew 10 mg by mouth at bedtime.    . Cholecalciferol (VITAMIN D PO) Take 1 tablet by mouth daily.    . cyclobenzaprine (FLEXERIL) 10 MG tablet Take 10 mg by mouth as needed.     . estropipate (OGEN) 1.5 MG tablet Take 1.5 mg by mouth every morning.     . fluticasone (VERAMYST) 27.5 MCG/SPRAY nasal spray Place 2 sprays into the nose daily.    . hyoscyamine (LEVSIN SL) 0.125 MG SL tablet Take 2 tablets by mouth twice daily as needed for stomach cramps    . loratadine (CLARITIN) 10 MG tablet Take 10 mg by mouth daily.    . magnesium gluconate (MAGONATE) 500 MG tablet Take 500 mg by mouth daily.     . Melatonin 1 MG TABS Take by mouth. Use as needed per bottle for insomnia    . mirabegron ER (MYRBETRIQ) 50 MG TB24 tablet Take 1 tablet (50 mg total) by mouth daily. 90 tablet 1  . NON FORMULARY Ultima electrolyte powder Take 1 tsp by mouth three times a day    . Probiotic Product (ALIGN PO) Take 1 tablet by mouth   at bedtime.    . XIIDRA 5 % SOLN Take 1 drop by mouth 2 (two) times daily.     No current facility-administered medications for this visit.     Allergies  Allergen Reactions  . Aspirin Other (See Comments)    Ecchymosis with high doses  . Codeine Sulfate Nausea Only  . Erythromycin Nausea And Vomiting  . Sulfa Antibiotics Nausea Only  . Sulfonamide Derivatives Nausea Only      Review of Systems negative except from HPI and PMH  Physical Exam BP (!) 156/78 (BP Location: Left Arm, Patient Position: Sitting, Cuff Size: Normal)   Pulse 88   Ht 5' 2" (1.575 m)   Wt 137 lb 8 oz (62.4 kg)   BMI 25.15 kg/m    Well developed and nourished in no acute distress HENT normal Neck supple with JVP-flat Carotids brisk and full with transmitted murmur Clear Regular rate and rhythm, 3/6 M Abd-soft  with active BS without hepatomegaly No Clubbing cyanosis edema Skin-warm and dry A & Oriented  Grossly normal sensory and motor function     ECG today Sinus rhythm 88 Intervals 14/12/38 LBBB Axis -67  Assessment and  Plan  Hypertension  Orthostatic hypotension  Aortic stenosis-mild  Hyperkalemia  LBBB    We will resume her bisoprolol.  Bisoprolol can be associated with edema but mostly as best as I gather this occurs in the context of heart failure. She was also concurrently on amlodipine which is a far more likely culprit.  This may help her blood pressure.  Aortic stenosis by most recent assessment remains mild. \ We will recheck her metabolic profile given her hyperkalemia  Orthostasis is relatively quiescent.  She is struggling with moving from her house with [place]-2 acre yard       

## 2016-06-23 NOTE — Patient Instructions (Signed)
Medication Instructions: - Your physician has recommended you make the following change in your medication:  1) Increase bisoprolol 5 mg- take 1 tablet by mouth once daily  Labwork: - none ordered  Procedures/Testing: - none ordered  Follow-Up: - Your physician wants you to follow-up in: 1 year with Dr. Caryl Comes. You will receive a reminder letter in the mail two months in advance. If you don't receive a letter, please call our office to schedule the follow-up appointment.   Any Additional Special Instructions Will Be Listed Below (If Applicable).     If you need a refill on your cardiac medications before your next appointment, please call your pharmacy.

## 2016-07-06 ENCOUNTER — Other Ambulatory Visit
Admission: RE | Admit: 2016-07-06 | Discharge: 2016-07-06 | Disposition: A | Discharge status: Home / Self Care | Payer: Medicare Other | Source: Ambulatory Visit | Attending: Unknown Physician Specialty | Admitting: Unknown Physician Specialty

## 2016-07-06 ENCOUNTER — Other Ambulatory Visit: Payer: Self-pay | Admitting: Unknown Physician Specialty

## 2016-07-06 DIAGNOSIS — R05 Cough: Secondary | ICD-10-CM | POA: Diagnosis present

## 2016-07-06 DIAGNOSIS — R1312 Dysphagia, oropharyngeal phase: Secondary | ICD-10-CM

## 2016-07-06 LAB — EXPECTORATED SPUTUM ASSESSMENT W REFEX TO RESP CULTURE

## 2016-07-06 LAB — EXPECTORATED SPUTUM ASSESSMENT W GRAM STAIN, RFLX TO RESP C

## 2016-07-12 LAB — CULTURE, RESPIRATORY W GRAM STAIN

## 2016-07-12 LAB — CULTURE, RESPIRATORY

## 2016-07-22 ENCOUNTER — Ambulatory Visit
Admission: RE | Admit: 2016-07-22 | Discharge: 2016-07-22 | Disposition: A | Discharge status: Home / Self Care | Payer: Medicare Other | Source: Ambulatory Visit | Attending: Unknown Physician Specialty | Admitting: Unknown Physician Specialty

## 2016-07-22 DIAGNOSIS — R1310 Dysphagia, unspecified: Secondary | ICD-10-CM | POA: Insufficient documentation

## 2016-07-22 DIAGNOSIS — I447 Left bundle-branch block, unspecified: Secondary | ICD-10-CM | POA: Insufficient documentation

## 2016-07-22 DIAGNOSIS — K219 Gastro-esophageal reflux disease without esophagitis: Secondary | ICD-10-CM | POA: Insufficient documentation

## 2016-07-22 DIAGNOSIS — K589 Irritable bowel syndrome without diarrhea: Secondary | ICD-10-CM | POA: Insufficient documentation

## 2016-07-22 DIAGNOSIS — I2510 Atherosclerotic heart disease of native coronary artery without angina pectoris: Secondary | ICD-10-CM | POA: Insufficient documentation

## 2016-07-22 DIAGNOSIS — E538 Deficiency of other specified B group vitamins: Secondary | ICD-10-CM | POA: Insufficient documentation

## 2016-07-22 DIAGNOSIS — M797 Fibromyalgia: Secondary | ICD-10-CM | POA: Diagnosis not present

## 2016-07-22 DIAGNOSIS — R1312 Dysphagia, oropharyngeal phase: Secondary | ICD-10-CM

## 2016-07-22 NOTE — Therapy (Signed)
[place], [place], [address] Phone: [phone]   Fax:     Modified Barium Swallow  Patient Details  Name: Erin Hunt MRN: [id number] Date of Birth: [birth year] Referring Provider: Dr. [name]  Encounter Date: 07/22/2016      End of Session - 07/22/16 1404    Visit Number 1   Number of Visits 1   Date for SLP Re-Evaluation 07/22/16   SLP Start Time 1194   SLP Stop Time  1330   SLP Time Calculation (min) 45 min   Activity Tolerance Patient tolerated treatment well      Past Medical History:  Diagnosis Date  . Allergic rhinitis   . Anemia   . Arthritis   . CAD (coronary artery disease)   . Carpal tunnel syndrome   . Cervical spine degeneration   . Diverticulosis   . Fibromyalgia   . GERD (gastroesophageal reflux disease)   . Hypertension   . IBS (irritable bowel syndrome)   . IgG gammopathy   . LBBB (left bundle branch block)   . PONV (postoperative nausea and vomiting)   . Vitamin B12 deficiency     Past Surgical History:  Procedure Laterality Date  . BREAST LUMPECTOMY WITH RADIOACTIVE SEED LOCALIZATION Left 06/25/2014   Procedure: LEFT BREAST LUMPECTOMY WITH RADIOACTIVE SEED LOCALIZATION;  Surgeon: Coralie Keens, MD;  Location: [facility];  Service: General;  Laterality: Left;  . CARDIAC CATHETERIZATION    . CHOLECYSTECTOMY    . TSA    . VAGINAL HYSTERECTOMY     L ovary removed  . VESICOVAGINAL FISTULA CLOSURE W/ TAH      There were no vitals filed for this visit.  Subjective: Patient behavior: (alertness, ability to follow instructions, etc.): The patient is able to follow directions and verbalize her swallowing concerns  Chief complaint: "strangling" due to liquids "going the wrong way";    Objective:  Radiological Procedure: A videoflouroscopic evaluation of oral-preparatory, reflex initiation, and pharyngeal phases of the swallow was performed; as  well as a screening of the upper esophageal phase.  I. POSTURE: Upright in MBS chair  II. VIEW: Lateral  III. COMPENSATORY STRATEGIES: N/A  IV. BOLUSES ADMINISTERED:   Thin Liquid: 2 small cup rim sips, 3 rapid consecutive sips   Nectar-thick Liquid: 1 moderate cup rim sip   Honey-thick Liquid: DNT   Puree: 2 teaspoon presentations   Mechanical Soft: 1/4 graham cracker in applesauce  V. RESULTS OF EVALUATION: A. ORAL PREPARATORY PHASE: (The lips, tongue, and velum are observed for strength and coordination)       **Overall Severity Rating: Within normal limits  B. SWALLOW INITIATION/REFLEX: (The reflex is normal if "triggered" by the time the bolus reached the base of the tongue)  **Overall Severity Rating: Mild; triggers while falling from the valleculae to the pyriform sinuses  C. PHARYNGEAL PHASE: (Pharyngeal function is normal if the bolus shows rapid, smooth, and continuous transit through the pharynx and there is no pharyngeal residue after the swallow)  **Overall Severity Rating: Mild; reduced hyolaryngeal excursion, incomplete epiglottic inversion, coating residue throughout pharynx  D. LARYNGEAL PENETRATION: (Material entering into the laryngeal inlet/vestibule but not aspirated) None  E. ASPIRATION: None  F. ESOPHAGEAL PHASE: (Screening of the upper esophagus) No observed abnormality within the viewable cervical esophagus  ASSESSMENT: This 83 year old year old woman; with dysphonia (diplophonic, tight, hoarseness, breathiness, glottal fry, pharyngeal focus, phonation breaks, decreased loudness, weak voice) and subjective  complaints of "strangling" due to liquids "going the wrong way"; is presenting with mild oropharyngeal dysphagia characterized by delayed pharyngeal swallow initiation, reduced hyolaryngeal excursion, and incomplete epiglottic inversion.  There is no observed laryngeal penetration or tracheal aspiration.  The patient is not at significant risk for  chronic aspiration.  The episodes that she experiences do not pose a health risk.    PLAN/RECOMMENDATIONS:   A. Diet: Regular   B. Swallowing Precautions: Drink "mindfully"   C. Recommended consultation to: return to MD as recommended   D. Therapy recommendations: N/A   E. Results and recommendations were discussed with the patient immediately following the study and the final report routed to the referring MD.    Oropharyngeal dysphagia - Plan: DG OP Swallowing Func-Medicare/Speech Path, DG OP Swallowing Func-Medicare/Speech Path      G-Codes - 2016/08/02 1405    Functional Assessment Tool Used MBS, clinical judgment   Functional Limitations Swallowing   Swallow Current Status (Z6109) At least 20 percent but less than 40 percent impaired, limited or restricted   Swallow Goal Status (U0454) At least 20 percent but less than 40 percent impaired, limited or restricted   Swallow Discharge Status [phone]) At least 20 percent but less than 40 percent impaired, limited or restricted          Problem List Patient Active Problem List   Diagnosis Date Noted  . Arthritis 10/26/2015  . Hypertension 10/26/2015  . Dysphonia 10/05/2015  . Pain in throat 10/05/2015  . Encounter for fitting or adjustment of hearing aid 04/28/2015  . Chronic rhinitis 10/28/2014  . Sensorineural hearing loss of both ears 08/29/2014  . Postnasal drip 07/29/2014  . Atypical chest pain 04/09/2013  . Temporary cerebral vascular dysfunction 03/25/2013  . Aortic valve stenosis 01/02/2013  . Coronary arteriosclerosis in native artery 01/02/2013  . Monoclonal gammopathy of unknown significance 03/25/2011  . Multiple pulmonary nodules 08/30/2010  . ABDOMINAL PAIN 11/30/2009  . COUGH, CHRONIC 10/23/2009  . Hereditary and idiopathic neuropathy 09/16/2009  . Acute stress disorder 01/28/2009  . FACIAL PAIN 01/28/2009  . Headache 01/28/2009  . POSTMENOPAUSAL STATUS 06/11/2008  . Post menopausal syndrome  06/11/2008  . Vaginitis and vulvovaginitis 05/14/2008  . Gastroesophageal reflux disease 03/26/2008  . Increased frequency of urination 03/26/2008  . CARPAL TUNNEL SYNDROME, BILATERAL 01/21/2008  . Osteoarthritis of hand 01/21/2008  . Degeneration of intervertebral disc of cervical region 01/21/2008  . Vitamin B-complex deficiency 12/04/2007  . Hyperlipidemia 12/04/2007  . Benign essential hypertension 12/04/2007  . Allergic rhinitis 12/04/2007  . DIVERTICULOSIS OF COLON 12/04/2007  . Irritable bowel syndrome 12/04/2007  . Muscle pain 12/04/2007  . Diverticulosis of large intestine without perforation or abscess without bleeding 12/04/2007   Leroy Sea, MS/CCC- SLP  Lou Miner 2016-08-02, 2:05 PM  [facility] [place], [place], [address] Phone: [phone]   Fax:     Name: Erin Hunt MRN: [id number] Date of Birth: 03/22/1928

## 2016-07-25 ENCOUNTER — Other Ambulatory Visit: Payer: Medicare Other

## 2016-08-03 ENCOUNTER — Telehealth: Payer: Self-pay | Admitting: Internal Medicine

## 2016-08-03 NOTE — Telephone Encounter (Signed)
Pt daughter called stating she was advised by patient PCP to call us if BP was to stay high in the next couple weeks She is letting us know, also wanted to know could the High BP be caused by patient having a UTI   She will keep a record of BP and will call us back in a few days   Please advise

## 2016-08-04 NOTE — Telephone Encounter (Signed)
I left a message for the patient to call. 

## 2016-08-29 ENCOUNTER — Telehealth: Payer: Self-pay | Admitting: Internal Medicine

## 2016-08-29 MED ORDER — HYDRALAZINE HCL 25 MG PO TABS: 25.0000 mg | ORAL_TABLET | Freq: Three times a day (TID) | ORAL | 3 refills | Status: DC

## 2016-08-29 MED ORDER — AMLODIPINE BESYLATE 5 MG PO TABS: 5.0000 mg | ORAL_TABLET | Freq: Every day | ORAL | 3 refills | Status: DC

## 2016-08-29 NOTE — Telephone Encounter (Signed)
S/w patient's daughter. She verbalized understanding of instructions and how to take medications and rationale. She will continue to have the patient monitor BP and HR and call us with an update if no improvement. Rx for amlodipine and hydralazine sent to pharmacy. Will route to Dr Caryl Comes for additional advice and review.

## 2016-08-29 NOTE — Telephone Encounter (Signed)
Pt daughter calling stating pt has had high bp for a couple weeks She called pcp but they advised her to call us and give [place] the readings She only has this reading below from memory She will have to call patient and get the rest    She will call back in a little bit with the rest of the readings    08/29/16: 178/90

## 2016-08-29 NOTE — Telephone Encounter (Signed)
No answer to daughter listed on DPR. Left message to call back.

## 2016-08-29 NOTE — Telephone Encounter (Signed)
Received incoming call from daughter, ok per DPR. Patient's BP has been elevated: 08/29/16 165/98 08/28/16 179/97 08/27/16 191/88 08/26/16 168/79 08/25/16 191/100 08/24/16 178/86  Heart rate is consistently in the 60's to 70's.  Daughter says her mother is a "little princess at times, though I love her to death" and says her eyes are not really that puffy but the patient feels they are. Patient also reports her face feels tingly when BP is high. Denies chest pain, shortness of breath, dizziness or palpitations. Patient took Amlodipine 1/2 tablet (5mg ) on Saturday and Sunday and they feel the BP has decreased some. Patient is taking Bisoprolol 1/2 tablet (2.5 mg) by mouth twice a day to equal the full 5 mg for daily. Patient is convinced the Bisoprolol was what made her swell even though she is not having any swelling now since stopping Amlodipine around 06/23/16. Advised to continue to monitor BP and symptoms and will route to [name], PA-C for advice as Dr Caryl Comes is out of the office so elevated BP can be addressed.

## 2016-08-29 NOTE — Telephone Encounter (Signed)
Pt c/o BP issue: STAT if pt c/o blurred vision, one-sided weakness or slurred speech  1. What are your last 5 BP readings?   08/29/16 178/90 08/28/16 179/97 08/27/16 191/88 08/26/16 168/79 08/25/16 191/100 08/24/16 178/86   2. Are you having any other symptoms (ex. Dizziness, headache, blurred vision, passed out)?  When pt bp is high, her eyes get puffy and face feels red.   3. What is your BP issue?  BP is running high She told patient to take an extra amilodpine half a pill sat and Sunday morning in addition to the Bisoprolol

## 2016-08-29 NOTE — Telephone Encounter (Signed)
There can be some swelling associated with bisoprolol though it is much more common with amlodipine. Looks like she has also previously had issues with hyperkalemia which may have precluded the usage of ACE inhibitor. If patient is tolerating amlodipine 5 mg daily I would continue this dose without further titration. She has previously had issues with orthostatic hypotension, thus we must be cautious and titration of her antihypertensives. Given her heart rates in the 60s I am hesitant to further increase her bisoprolol. Would recommend patient start hydralazine 25 mg 3 times a day for systolic blood pressure greater than 160 mmHg. Follow-up with Dr. Caryl Comes.

## 2016-08-30 NOTE — Telephone Encounter (Signed)
Lets see how it works

## 2016-11-14 ENCOUNTER — Other Ambulatory Visit: Payer: Self-pay | Admitting: Urology

## 2016-11-14 DIAGNOSIS — N3281 Overactive bladder: Secondary | ICD-10-CM

## 2016-11-23 ENCOUNTER — Ambulatory Visit (INDEPENDENT_AMBULATORY_CARE_PROVIDER_SITE_OTHER): Payer: Medicare Other | Admitting: Urology

## 2016-11-23 ENCOUNTER — Encounter: Payer: Self-pay | Admitting: Urology

## 2016-11-23 VITALS — BP 146/61 | HR 67 | Ht 63.0 in | Wt 130.0 lb

## 2016-11-23 DIAGNOSIS — N390 Urinary tract infection, site not specified: Secondary | ICD-10-CM | POA: Diagnosis not present

## 2016-11-23 DIAGNOSIS — N3941 Urge incontinence: Secondary | ICD-10-CM | POA: Diagnosis not present

## 2016-11-23 DIAGNOSIS — K5900 Constipation, unspecified: Secondary | ICD-10-CM | POA: Diagnosis not present

## 2016-11-23 LAB — MICROSCOPIC EXAMINATION
EPITHELIAL CELLS (NON RENAL): NONE SEEN /HPF (ref 0–10)
RBC, UA: NONE SEEN /hpf (ref 0–?)
WBC, UA: NONE SEEN /hpf (ref 0–?)

## 2016-11-23 LAB — URINALYSIS, COMPLETE
Bilirubin, UA: NEGATIVE
Glucose, UA: NEGATIVE
Ketones, UA: NEGATIVE
LEUKOCYTES UA: NEGATIVE
Nitrite, UA: NEGATIVE
PH UA: 6 (ref 5.0–7.5)
PROTEIN UA: NEGATIVE
RBC, UA: NEGATIVE
Specific Gravity, UA: <1.005 — ABNORMAL LOW (ref 1.005–1.030)
Urobilinogen, Ur: 0.2 mg/dL (ref 0.2–1.0)

## 2016-11-23 LAB — BLADDER SCAN AMB NON-IMAGING

## 2016-11-23 MED ORDER — ESTROGENS, CONJUGATED 0.625 MG/GM VA CREA: 1.0000 | TOPICAL_CREAM | Freq: Every day | VAGINAL | 12 refills | Status: DC

## 2016-11-23 NOTE — Progress Notes (Signed)
11/23/2016 11:58 AM   Erin Hunt 1928-04-21 [id number]  Referring provider: Perrin Maltese, MD [place], [place] [address]  Chief Complaint  Patient presents with  . Recurrent UTI    follow up    HPI: 83 yo F with mixed urinary incontinence returns today was referred back today for UTIs.  She was treated for UTIs x 2 in the last 6 months as associated gross hematuria.   She was treated by urgenct care which she reports was E. Coli and an infection treated by her PCP prior to this.  She reports with each of these infections, she had dysuria, gross hematuria, frequency, urgency, and overall fatigue/malaise.  Records requested from her primary care do not include her visit for acute UTI.  She is asymptomatic today.  Her urinary symptoms are at baseline.  No dysuria or gross hematuria.  She continues to take Mybetriq 50 mg which has significantly improved her urinary frequency and urgency symptoms.  She has no side effects from this medication.  She has also been seen and evaluated by physical therapy and has started doing Kegel exercises.  Her stress incontinence is also improved dramatically.  She does have already take probiotic.    She does have issues with constipation and occational anal sepage.  She uses a butterfly device to help control this.    PMH: Past Medical History:  Diagnosis Date  . Allergic rhinitis   . Anemia   . Arthritis   . CAD (coronary artery disease)   . Carpal tunnel syndrome   . Cervical spine degeneration   . Diverticulosis   . Fibromyalgia   . GERD (gastroesophageal reflux disease)   . Hypertension   . IBS (irritable bowel syndrome)   . IgG gammopathy   . LBBB (left bundle branch block)   . PONV (postoperative nausea and vomiting)   . Vitamin B12 deficiency     Surgical History: Past Surgical History:  Procedure Laterality Date  . BREAST LUMPECTOMY WITH RADIOACTIVE SEED LOCALIZATION Left 06/25/2014   Procedure: LEFT BREAST  LUMPECTOMY WITH RADIOACTIVE SEED LOCALIZATION;  Surgeon: Coralie Keens, MD;  Location: [facility];  Service: General;  Laterality: Left;  . CARDIAC CATHETERIZATION    . CHOLECYSTECTOMY    . TSA    . VAGINAL HYSTERECTOMY     L ovary removed  . VESICOVAGINAL FISTULA CLOSURE W/ TAH      Home Medications:  Allergies as of 11/23/2016      Reactions   Aspirin Other (Hunt Comments)   Ecchymosis with high doses   Codeine Sulfate Nausea Only   Erythromycin Nausea And Vomiting   Sulfa Antibiotics Nausea Only   Sulfonamide Derivatives Nausea Only      Medication List       Accurate as of 11/23/16 11:59 PM. Always use your most recent med list.          ALIGN PO Take 1 tablet by mouth at bedtime.   amLODipine 5 MG tablet Commonly known as:  NORVASC Take 1 tablet (5 mg total) by mouth daily.   bisoprolol 5 MG tablet Commonly known as:  ZEBETA Take 1 tablet (5 mg total) by mouth daily.   celecoxib 200 MG capsule Commonly known as:  CELEBREX Take 200 mg by mouth as needed (joint pain).   cetirizine 10 MG chewable tablet Commonly known as:  ZYRTEC Chew 10 mg by mouth at bedtime.   conjugated estrogens vaginal cream Commonly known as:  PREMARIN  Place 1 Applicatorful vaginally daily. Use pea sized amount M-W-Fr before bedtime   cyclobenzaprine 10 MG tablet Commonly known as:  FLEXERIL Take 10 mg by mouth as needed.   estropipate 1.5 MG tablet Commonly known as:  OGEN Take 1.5 mg by mouth every morning.   fluticasone 27.5 MCG/SPRAY nasal spray Commonly known as:  VERAMYST Place 2 sprays into the nose daily.   hydrALAZINE 25 MG tablet Commonly known as:  APRESOLINE Take 1 tablet (25 mg total) by mouth 3 (three) times daily.   hyoscyamine 0.125 MG SL tablet Commonly known as:  LEVSIN SL Take 2 tablets by mouth twice daily as needed for stomach cramps   loratadine 10 MG tablet Commonly known as:  CLARITIN Take 10 mg by mouth daily.   magnesium  gluconate 500 MG tablet Commonly known as:  MAGONATE Take 500 mg by mouth daily.   Melatonin 1 MG Tabs Take by mouth. Use as needed per bottle for insomnia   MYRBETRIQ 50 MG Tb24 tablet Generic drug:  mirabegron ER TAKE 1 TABLET DAILY.   NON FORMULARY Ultima electrolyte powder Take 1 tsp by mouth three times a day   VITAMIN D PO Take 1 tablet by mouth daily.   XIIDRA 5 % Soln Generic drug:  Lifitegrast Take 1 drop by mouth 2 (two) times daily.       Allergies:  Allergies  Allergen Reactions  . Aspirin Other (Hunt Comments)    Ecchymosis with high doses  . Codeine Sulfate Nausea Only  . Erythromycin Nausea And Vomiting  . Sulfa Antibiotics Nausea Only  . Sulfonamide Derivatives Nausea Only    Family History: Family History  Problem Relation Age of Onset  . Diverticulosis Mother   . Coronary artery disease Father   . Heart disease Paternal Aunt   . Heart disease Paternal Uncle   . Breast cancer Paternal Aunt   . Prostate cancer Brother   . Leukemia Brother   . Heart attack Brother   . Hypertension Brother   . Stroke Brother     Social History:  reports that she has never smoked. She has never used smokeless tobacco. She reports that she does not drink alcohol or use drugs.  ROS: UROLOGY Frequent Urination?: Yes Hard to postpone urination?: Yes Burning/pain with urination?: No Get up at night to urinate?: No Leakage of urine?: No Urine stream starts and stops?: Yes Trouble starting stream?: No Do you have to strain to urinate?: Yes Blood in urine?: No Urinary tract infection?: No Sexually transmitted disease?: No Injury to kidneys or bladder?: No Painful intercourse?: No Weak stream?: No Currently pregnant?: No Vaginal bleeding?: No Last menstrual period?: n  Gastrointestinal Nausea?: No Vomiting?: No Indigestion/heartburn?: No Diarrhea?: No Constipation?: No  Constitutional Fever: No Night sweats?: No Weight loss?: No Fatigue?:  No  Skin Skin rash/lesions?: No Itching?: Yes  Eyes Blurred vision?: Yes Double vision?: No  Ears/Nose/Throat Sore throat?: No Sinus problems?: Yes  Hematologic/Lymphatic Swollen glands?: No Easy bruising?: No  Cardiovascular Leg swelling?: No Chest pain?: No  Respiratory Cough?: Yes Shortness of breath?: No  Endocrine Excessive thirst?: No  Musculoskeletal Back pain?: Yes Joint pain?: Yes  Neurological Headaches?: No Dizziness?: Yes  Psychologic Depression?: No Anxiety?: No  Physical Exam: BP (!) 146/61   Pulse 67   Ht 5\' 3"  (1.6 m)   Wt 130 lb (59 kg)   BMI 23.03 kg/m   Constitutional:  Alert and oriented, No acute distress.  Presents with daughter today. HEENT:  [place] AT, moist mucus membranes.  Trachea midline, no masses. Cardiovascular: No clubbing, cyanosis, or edema. Respiratory: Normal respiratory effort, no increased work of breathing. Skin: No rashes, bruises or suspicious lesions. Neurologic: Grossly intact, no focal deficits, moving all 4 extremities. Psychiatric: Normal mood and affect.  Laboratory Data: Lab Results  Component Value Date   WBC 8.2 11/26/2014   HGB 13.8 11/26/2014   HCT 41.5 11/26/2014   MCV 84.6 11/26/2014   PLT 325.0 11/26/2014    Lab Results  Component Value Date   CREATININE 0.86 06/18/2015     Lab Results  Component Value Date   HGBA1C 5.8 (H) 03/26/2013    Urinalysis Results for orders placed or performed in visit on 11/23/16  Microscopic Examination  Result Value Ref Range   WBC, UA None seen 0 - 5 /hpf   RBC, UA None seen 0 - 2 /hpf   Epithelial Cells (non renal) None seen 0 - 10 /hpf   Casts Present (A) None seen /lpf   Cast Type Hyaline casts N/A   Mucus, UA Present (A) Not Estab.   Bacteria, UA Few (A) None seen/Few  Urinalysis, Complete  Result Value Ref Range   Specific Gravity, UA <1.005 (L) 1.005 - 1.030   pH, UA 6.0 5.0 - 7.5   Color, UA Yellow Yellow   Appearance Ur Clear Clear    Leukocytes, UA Negative Negative   Protein, UA Negative Negative/Trace   Glucose, UA Negative Negative   Ketones, UA Negative Negative   RBC, UA Negative Negative   Bilirubin, UA Negative Negative   Urobilinogen, Ur 0.2 0.2 - 1.0 mg/dL   Nitrite, UA Negative Negative   Microscopic Examination Hunt below:   BLADDER SCAN AMB NON-IMAGING  Result Value Ref Range   Scan Result 24ml    Assessment & Plan:    1. Urinary incontinence, urge/ stress incontinence/ OAB Doing well with Mybetriq 50 mg daily Adequate bladder emptying- PVR remains low Continue this medication indefinitely Patient was advised to return if she notes of the medication is no longer helping her   2. Stress incontinence, female Continue physical therapy, improving  3. Recurrent UTI Similarly 2 infections over the past 6 months At this time, would not recommend any additional workup unless she continues to have recurrent episodes of infection I would like her to come to our office specifically for UA/urine culture if she has signs or symptoms of infection Recommend continuation of cranberry tabs twice daily, probiotic She is currently on low-dose hormone therapy, but may benefit from additional local topical estrogen cream Topical estrogen, pea-sized amount to be applied per urethra Monday Wednesday Friday was prescribed, no contraindications and extensively reviewed application  Return in about 1 year (around 11/23/2017) for recheck.  Or sooner with UTI symptoms  Hollice Espy, MD  [facility] [address] [place], [place], [place] [address] [phone]

## 2016-12-02 ENCOUNTER — Telehealth: Payer: Self-pay | Admitting: Urology

## 2016-12-05 ENCOUNTER — Other Ambulatory Visit: Payer: Self-pay | Admitting: Physician Assistant

## 2016-12-05 ENCOUNTER — Ambulatory Visit
Admission: RE | Admit: 2016-12-05 | Discharge: 2016-12-05 | Disposition: A | Discharge status: Home / Self Care | Payer: Medicare Other | Source: Ambulatory Visit | Attending: Physician Assistant | Admitting: Physician Assistant

## 2016-12-05 DIAGNOSIS — R198 Other specified symptoms and signs involving the digestive system and abdomen: Secondary | ICD-10-CM

## 2016-12-05 DIAGNOSIS — R1084 Generalized abdominal pain: Secondary | ICD-10-CM

## 2016-12-05 DIAGNOSIS — R140 Abdominal distension (gaseous): Secondary | ICD-10-CM

## 2016-12-28 ENCOUNTER — Other Ambulatory Visit: Payer: Self-pay

## 2017-01-26 ENCOUNTER — Encounter: Payer: Self-pay | Admitting: Internal Medicine

## 2017-01-26 ENCOUNTER — Ambulatory Visit (INDEPENDENT_AMBULATORY_CARE_PROVIDER_SITE_OTHER): Payer: Medicare Other | Admitting: Internal Medicine

## 2017-01-26 VITALS — BP 164/70 | HR 67 | Ht 63.0 in | Wt 136.8 lb

## 2017-01-26 DIAGNOSIS — I951 Orthostatic hypotension: Secondary | ICD-10-CM

## 2017-01-26 DIAGNOSIS — I10 Essential (primary) hypertension: Secondary | ICD-10-CM

## 2017-01-26 DIAGNOSIS — E875 Hyperkalemia: Secondary | ICD-10-CM | POA: Diagnosis not present

## 2017-01-26 DIAGNOSIS — I350 Nonrheumatic aortic (valve) stenosis: Secondary | ICD-10-CM | POA: Diagnosis not present

## 2017-01-26 DIAGNOSIS — I447 Left bundle-branch block, unspecified: Secondary | ICD-10-CM

## 2017-01-26 MED ORDER — HYDRALAZINE HCL 25 MG PO TABS: 25.0000 mg | ORAL_TABLET | Freq: Two times a day (BID) | ORAL | Status: DC

## 2017-01-26 NOTE — Patient Instructions (Signed)
Medication Instructions: - Your physician has recommended you make the following change in your medication:  1) Hydralazine 25 mg- take 1 tablet by mouth twice daily  Labwork: - none ordered  Procedures/Testing: - Your physician has requested that you have an echocardiogram- in 1 year. Echocardiography is a painless test that uses sound waves to create images of your heart. It provides your doctor with information about the size and shape of your heart and how well your heart's chambers and valves are working. This procedure takes approximately one hour. There are no restrictions for this procedure.  Follow-Up: - Your physician wants you to follow-up in: 1 year with Dr. Caryl Comes (echocardiogram just prior) You will receive a reminder letter in the mail two months in advance. If you don't receive a letter, please call our office to schedule the follow-up appointment.   Any Additional Special Instructions Will Be Listed Below (If Applicable).     If you need a refill on your cardiac medications before your next appointment, please call your pharmacy.

## 2017-01-26 NOTE — Progress Notes (Signed)
Patient Care Team: Perrin Maltese, MD as PCP - General (Internal Medicine)   HPI  Erin Hunt is a 84 y.o. female Seen in followup of orthostatic hypotension and systolic hypertension.  Seen 7/16 and discussed non pharmacological Rx, incl blocks isometric contraction, abdominal binders, and fluids   She's had problems with edema limiting up titration of amlodipine. ARB's were not tolerated because of hyperkalemia. At the last visit we added low-dose bisoprolol  Last 6 months have not been good.  She has had episodic hypertension requiring a couple of days of hydralazine at a time.  Hypertension is associated with flushing headaches and shortness of breath.   She has some lightheadedness with bending.  She also has had significant GI complaints  Date Cr K  12/16 1.03 5.5  5/17 0.86 4.9            Echocardiogram 3/15 was reviewed. DATE TEST    3/15 echo  EF normal   mild AS  6/17 Echo  EF 45-50% Mild AS (Mean G 10)   6/17 myoview EF normal No ischemia     Past Medical History:  Diagnosis Date  . Allergic rhinitis   . Anemia   . Arthritis   . CAD (coronary artery disease)   . Carpal tunnel syndrome   . Cervical spine degeneration   . Diverticulosis   . Fibromyalgia   . GERD (gastroesophageal reflux disease)   . Hypertension   . IBS (irritable bowel syndrome)   . IgG gammopathy   . LBBB (left bundle branch block)   . PONV (postoperative nausea and vomiting)   . Vitamin B12 deficiency     Past Surgical History:  Procedure Laterality Date  . BREAST LUMPECTOMY WITH RADIOACTIVE SEED LOCALIZATION Left 06/25/2014   Procedure: LEFT BREAST LUMPECTOMY WITH RADIOACTIVE SEED LOCALIZATION;  Surgeon: Coralie Keens, MD;  Location: [facility];  Service: General;  Laterality: Left;  . CARDIAC CATHETERIZATION    . CHOLECYSTECTOMY    . TSA    . VAGINAL HYSTERECTOMY     L ovary removed  . VESICOVAGINAL FISTULA CLOSURE W/ TAH      Current Outpatient  Medications  Medication Sig Dispense Refill  . amLODipine (NORVASC) 5 MG tablet Take 1 tablet (5 mg total) by mouth daily. 180 tablet 3  . bisoprolol (ZEBETA) 5 MG tablet Take 1 tablet (5 mg total) by mouth daily. (Patient taking differently: Take 5 mg by mouth daily. Patient is taking 1/2 tablet (2.5 mg) by mouth twice a day.) 90 tablet 3  . celecoxib (CELEBREX) 200 MG capsule Take 200 mg by mouth as needed (joint pain).     . Cholecalciferol (VITAMIN D PO) Take 1 tablet by mouth daily.    [name] conjugated estrogens (PREMARIN) vaginal cream Place 1 Applicatorful vaginally daily. Use pea sized amount M-W-Fr before bedtime 42.5 g 12  . cyclobenzaprine (FLEXERIL) 10 MG tablet Take 10 mg by mouth as needed.     [name] esomeprazole (NEXIUM) 40 MG capsule Take 40 mg by mouth daily at 12 noon.    . estropipate (OGEN) 1.5 MG tablet Take 1.5 mg by mouth every morning.     . hydrALAZINE (APRESOLINE) 25 MG tablet Take 1 tablet (25 mg total) by mouth 3 (three) times daily. 270 tablet 3  . hyoscyamine (LEVSIN SL) 0.125 MG SL tablet Take 2 tablets by mouth twice daily as needed for stomach cramps    . levocetirizine (XYZAL) 5 MG tablet  Take 5 mg by mouth every evening.    . magnesium gluconate (MAGONATE) 500 MG tablet Take 500 mg by mouth daily.     . Melatonin 1 MG TABS Take by mouth. Use as needed per bottle for insomnia    . MYRBETRIQ 50 MG TB24 tablet TAKE 1 TABLET DAILY. 90 tablet 1  . NON FORMULARY Ultima electrolyte powder Take 1 tsp by mouth three times a day    . Probiotic Product (ALIGN PO) Take 1 tablet by mouth at bedtime.     No current facility-administered medications for this visit.     Allergies  Allergen Reactions  . Aspirin Other (See Comments)    Ecchymosis with high doses  . Codeine Sulfate Nausea Only  . Erythromycin Nausea And Vomiting  . Sulfa Antibiotics Nausea Only  . Sulfonamide Derivatives Nausea Only      Review of Systems negative except from HPI and PMH  Physical Exam BP  (!) 164/70 (BP Location: Left Arm, Patient Position: Sitting, Cuff Size: Normal)   Pulse 67   Ht 5\' 3"  (1.6 m)   Wt 136 lb 12 oz (62 kg)   BMI 24.22 kg/m  Well developed and nourished in no acute distress HENT normal Neck supple with JVP-flat Clear Carotids brisk Regular rate and rhythm,3/6 m Abd-soft with active BS No Clubbing cyanosis edema Skin-warm and dry A & Oriented  Grossly normal sensory and motor function     ECG today Sinus rhythm 67 15/12/42   Assessment and  Plan  Hypertension  Orthostatic hypotension  Aortic stenosis-mild  Hyperkalemia  LBBB  Cardiomyopathy--mild  BP remains elevated and quite symptomatic when elevated. Will change hydralazine to bid not prn   Aortic stenosis remains mild by exam; will recheck echo next year  Cardiomyopathy mild may be related to LBBB  No symptoms of heart failure currently to justify early echo or resynchronization   She asked about cholesterol-- data recently published supported recommendations NOT to initiate therapy in Pts> 75 without DM and not even in Pts with DM > 85 Hence will neither measure nor treat   Metabolic profile will tracked down from PCP to make sure K is ok with history of hyperkalemia  More than 50% of 25 min was spent in counseling related to the above

## 2017-03-07 ENCOUNTER — Other Ambulatory Visit: Payer: Self-pay

## 2017-03-07 ENCOUNTER — Ambulatory Visit (INDEPENDENT_AMBULATORY_CARE_PROVIDER_SITE_OTHER): Payer: Medicare Other | Admitting: Family Medicine

## 2017-03-07 ENCOUNTER — Telehealth: Payer: Self-pay | Admitting: Urology

## 2017-03-07 VITALS — BP 161/73 | HR 67 | Temp 98.2°F | Ht 63.0 in

## 2017-03-07 DIAGNOSIS — N390 Urinary tract infection, site not specified: Secondary | ICD-10-CM

## 2017-03-07 LAB — URINALYSIS, COMPLETE
BILIRUBIN UA: NEGATIVE
Glucose, UA: NEGATIVE
Nitrite, UA: NEGATIVE
PH UA: 6 (ref 5.0–7.5)
SPEC GRAV UA: 1.025 (ref 1.005–1.030)
Urobilinogen, Ur: 1 mg/dL (ref 0.2–1.0)

## 2017-03-07 LAB — MICROSCOPIC EXAMINATION: WBC, UA: >30 /hpf — ABNORMAL HIGH (ref 0–?)

## 2017-03-07 MED ORDER — CEPHALEXIN 500 MG PO CAPS: 500.0000 mg | ORAL_CAPSULE | Freq: Four times a day (QID) | ORAL | 0 refills | Status: DC

## 2017-03-07 NOTE — Telephone Encounter (Signed)
Spoke with pt daughter in reference to needing abx and pharmacy. Daughter voiced understanding. [facility] [place]

## 2017-03-07 NOTE — Telephone Encounter (Signed)
Patient called requesting an appointment with Dr. Erlene Quan for a possible bladder infection.  Patient was seen in Oct 2018 so I added her to the nurse schedule for today.

## 2017-03-07 NOTE — Telephone Encounter (Signed)
-----   Message from Hollice Espy, MD sent at 03/07/2017 12:45 PM EST ----- This urine looks frankly positive much different than her previous urinalysis.  I do suspect a UTI.  Go ahead and treat with Keflex 500 mg 4 times a day for 7 days, will call to adjust as needed based on her culture sensitivities.Hollice Espy, MD

## 2017-03-07 NOTE — Progress Notes (Signed)
Patient presents today with urinary frequency, dysuria and back pain. She has not been on ABX in the last 30days and has not had any Urological surgeries in the last 30 days. A urine was collected for UA, UCX.  Patient states her symptoms started 2 days ago and have gotten significantly worse this morning. I told her to get the OTC AZO to help with the pain while we wait on the Culture to come back.

## 2017-03-07 NOTE — Telephone Encounter (Signed)
LMOM-  Medication was not sent to pharmacy. Need a local pharmacy.

## 2017-03-10 ENCOUNTER — Telehealth: Payer: Self-pay

## 2017-03-10 LAB — CULTURE, URINE COMPREHENSIVE

## 2017-03-10 MED ORDER — NITROFURANTOIN MONOHYD MACRO 100 MG PO CAPS: 100.0000 mg | ORAL_CAPSULE | Freq: Two times a day (BID) | ORAL | 0 refills | Status: DC

## 2017-03-10 NOTE — Telephone Encounter (Signed)
Spoke with pt daughter, [name], in reference to +ucx and macrobid and estrace. [name] voiced understanding of whole conversation.

## 2017-03-10 NOTE — Telephone Encounter (Signed)
-----   Message from Hollice Espy, MD sent at 03/10/2017 12:30 PM EST ----- Please let this patient know that she grew pansensitive E. coli.  The antibiotics provided should be effective.  Since she has had 3 infections this year, I like to place her on a low dose antibiotic prophylaxis for the next 3 months.  Please prescribe Macrobid once daily for 90 days.  Hopefully this will help break the cycle of infections.  Also, please ensure that she is using the topical estrogen cream.  If she has a breakthrough infection, we will consider cystoscopy.  Hollice Espy, MD

## 2017-03-14 ENCOUNTER — Other Ambulatory Visit: Payer: Self-pay

## 2017-03-14 MED ORDER — NITROFURANTOIN MONOHYD MACRO 100 MG PO CAPS: 100.0000 mg | ORAL_CAPSULE | Freq: Two times a day (BID) | ORAL | 0 refills | Status: DC

## 2017-03-22 ENCOUNTER — Telehealth: Payer: Self-pay | Admitting: Urology

## 2017-03-22 NOTE — Telephone Encounter (Signed)
Spoke with pt daughter and made aware macrobid has been sent to [facility]. Sherry voiced understanding.

## 2017-03-22 NOTE — Telephone Encounter (Signed)
Pt's daughter, [name], (works in anesthesia at [employer]) called again this morning asking about when fax was sent to [facility] for 90 day supply of Macrobid.  Please give her a call at work [phone]

## 2017-03-23 ENCOUNTER — Other Ambulatory Visit: Payer: Self-pay | Admitting: Internal Medicine

## 2017-03-24 NOTE — Telephone Encounter (Signed)
To Dr. Klein to review. 

## 2017-03-24 NOTE — Telephone Encounter (Signed)
Please advise- medication on back order

## 2017-03-24 NOTE — Telephone Encounter (Signed)
Do you mind calling the pharmacy to find out if [facility] and do they think another pharmacy may be able to get this for her?

## 2017-03-24 NOTE — Telephone Encounter (Signed)
I spoke to [facility] from [facility] and she mentioned that Bisoprolol is on a National BO and for their distribution center it shows that it won't be back in stock until July.

## 2017-03-25 IMAGING — RF DG ESOPHAGUS
14 of 24 series · 14 of 24 positions shown · non-contrast
Comparison: No priors.

CLINICAL DATA: 85-year-old female is history of cough and
hoarseness of voice. Difficulty swallowing. Pain in the lower
esophagus for the past year.

EXAM:
ESOPHOGRAM / BARIUM SWALLOW / BARIUM TABLET STUDY
TECHNIQUE: Combined double contrast and single contrast examination performed
using effervescent crystals, thick barium liquid, and thin barium
liquid. The patient was observed with fluoroscopy swallowing a 13 mm
barium sulphate tablet.
FLUOROSCOPY TIME:  If the device does not provide the exposure
index:
Fluoroscopy Time:  2 minutes and 50 seconds.
Number of Acquired Images:  31

[Series 1: run · 1 of 1 slices shown (1 of 14)]
[im 1/1]
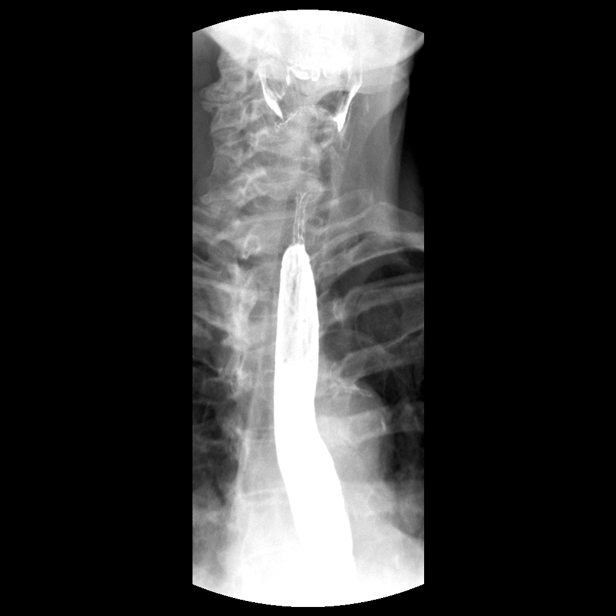

[Series 3: run · 1 of 1 slices shown (2 of 14)]
[im 1/1]
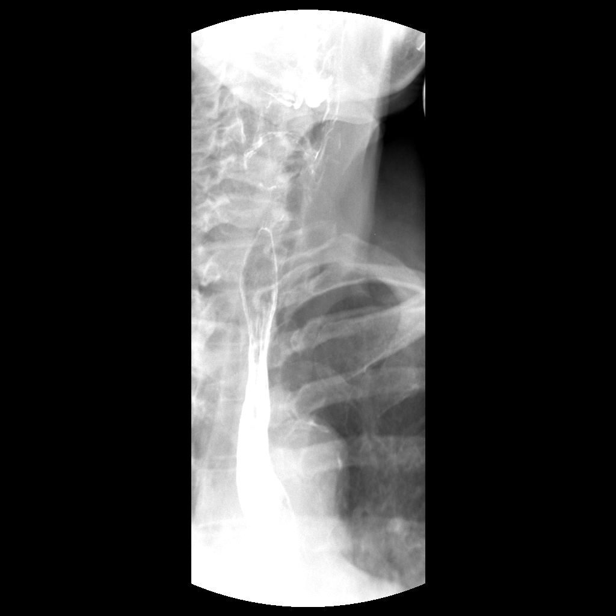

[Series 5: run · 1 of 1 slices shown (3 of 14)]
[im 1/1]
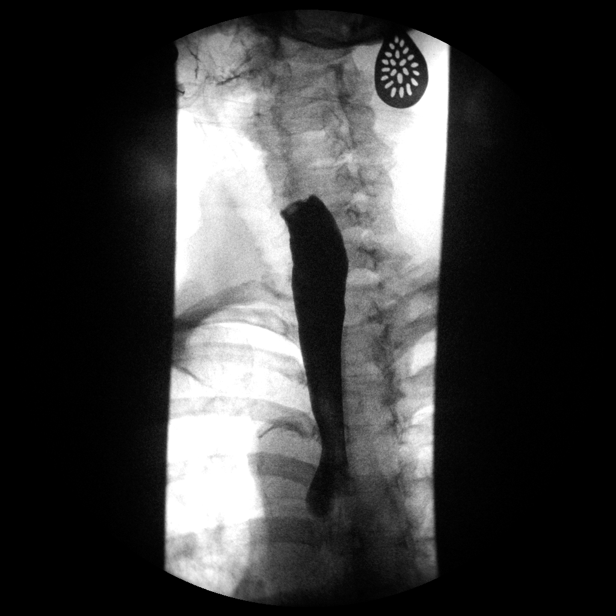

[Series 7: run · 1 of 1 slices shown (4 of 14)]
[im 1/1]
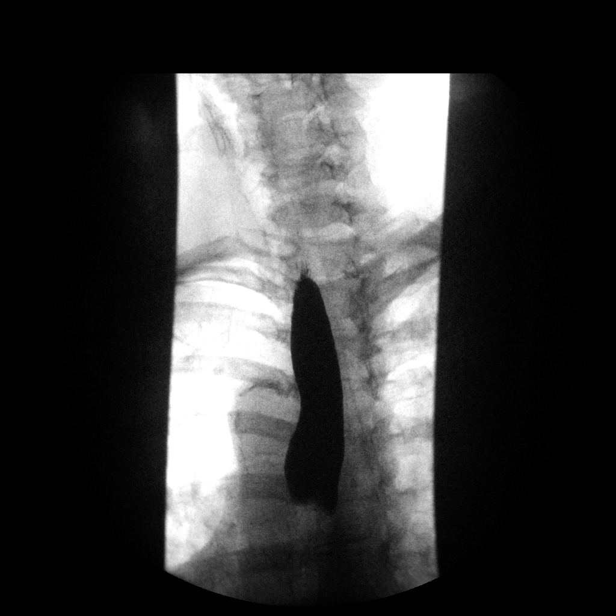

[Series 8: run · 1 of 1 slices shown (5 of 14)]
[im 1/1]
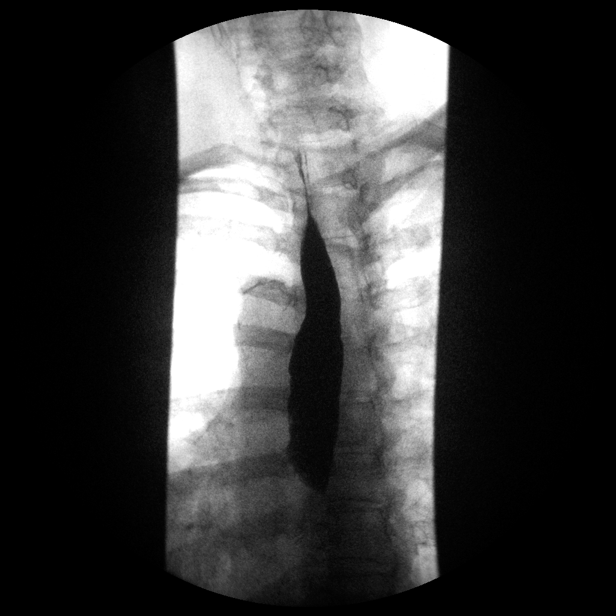

[Series 10: run · 1 of 1 slices shown (6 of 14)]
[im 1/1]
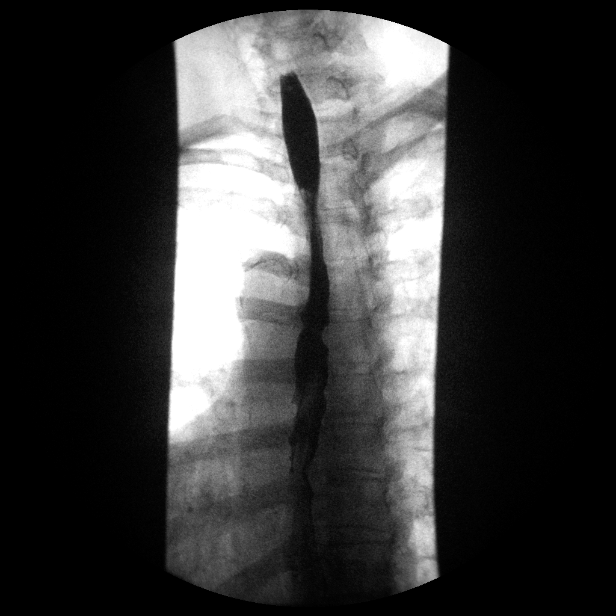

[Series 12: run · 1 of 1 slices shown (7 of 14)]
[im 1/1]
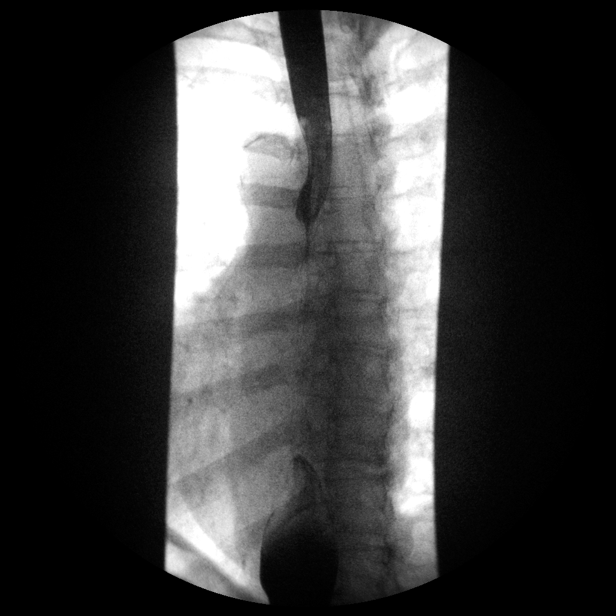

[Series 13: run · 1 of 1 slices shown (8 of 14)]
[im 1/1]
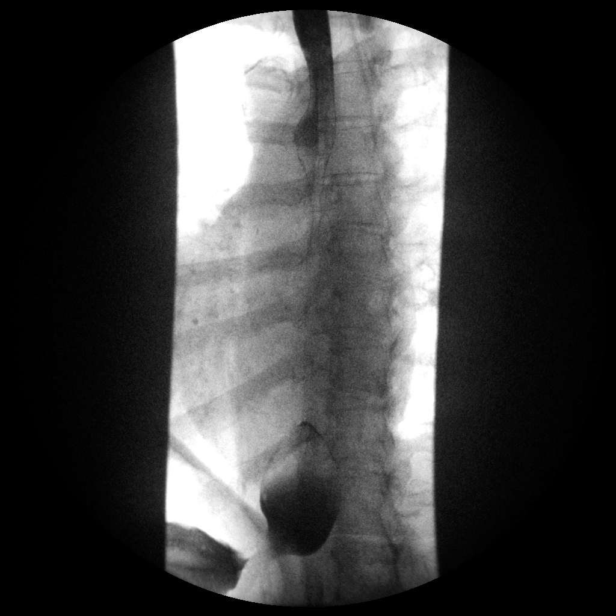

[Series 15: run · 1 of 1 slices shown (9 of 14)]
[im 1/1]
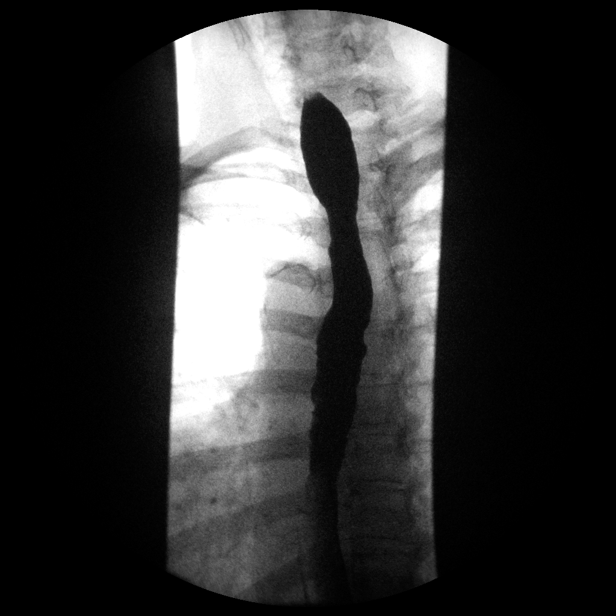

[Series 17: run · 1 of 1 slices shown (10 of 14)]
[im 1/1]
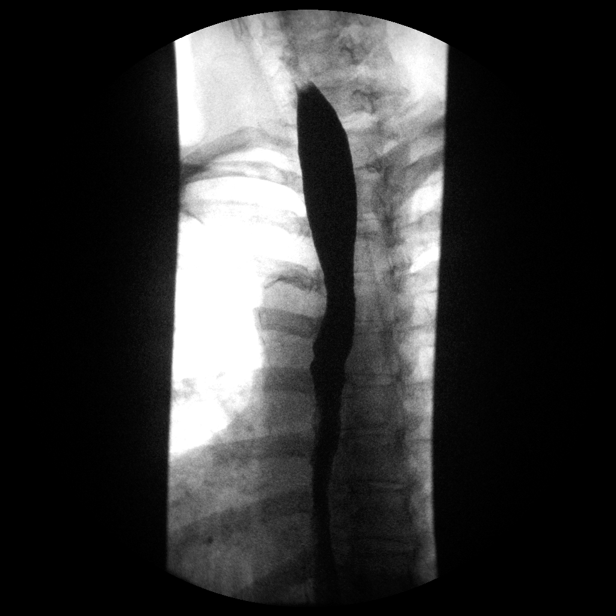

[Series 19: run · 1 of 1 slices shown (11 of 14)]
[im 1/1]
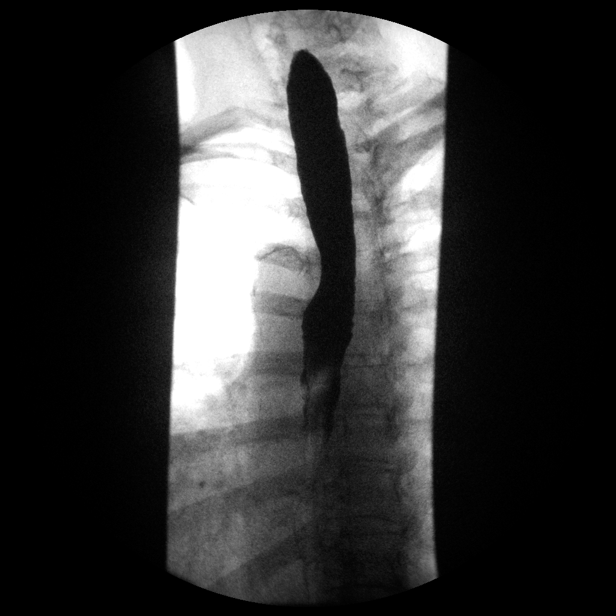

[Series 20: run · 1 of 1 slices shown (12 of 14)]
[im 1/1]
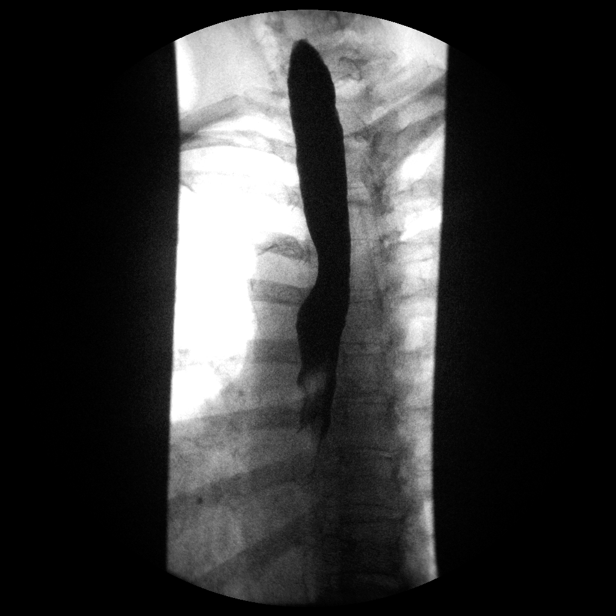

[Series 22: run · 1 of 1 slices shown (13 of 14)]
[im 1/1]
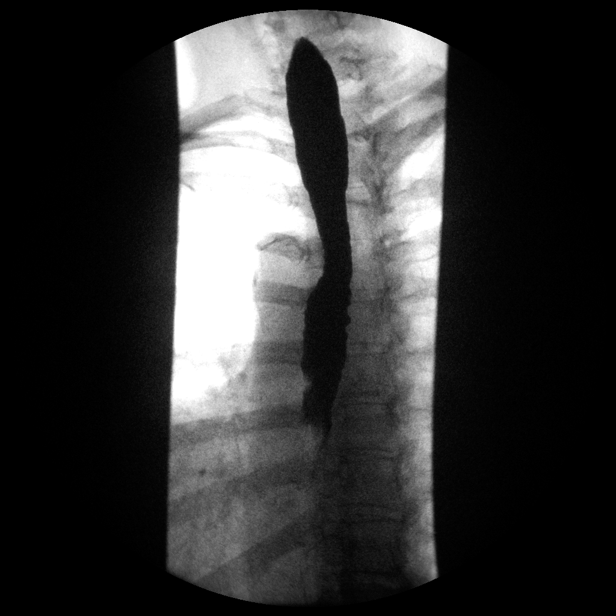

[Series 24: run · 1 of 1 slices shown (14 of 14)]
[im 1/1]
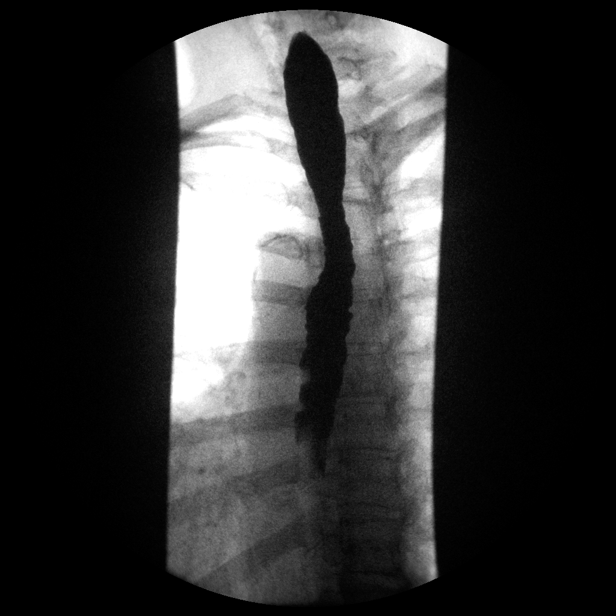

[14 of 24 positions shown; findings below may reference images not displayed]

FINDINGS: Double contrast images of the esophagus demonstrated a normal
appearance of the esophageal mucosa. Multiple single swallow
attempts were observed, which demonstrated failure of all of the
primary peristaltic waves to adequately propagate. Multiple tertiary
contractions were noted. Full column esophagram demonstrated no
evidence of esophageal mass, stricture, ring or hiatal hernia. Water
siphon test was negative for gastroesophageal reflux. A barium
tablet was administered which passed readily into the stomach.
IMPRESSION: 1. Nonspecific esophageal motility disorder with multiple tertiary
contractions.
2. Otherwise, normal esophagram.

## 2017-04-04 ENCOUNTER — Telehealth: Payer: Self-pay | Admitting: Internal Medicine

## 2017-04-04 NOTE — Telephone Encounter (Signed)
We have received notification from the pharmacy that the patient's bisoprolol is on national backorder and will need to be replaced. Reviewed with Dr. Caryl Comes- 3 options given for replacement for bisoprolol  1) Metoprolol succinate 25 mg once daily 2) Metoprolol tartrate 25 mg BID 3) or Inderal LA 60 mg once daily  I left a message for the patient's daughter, [name] (ok per [facility]) to please call to discuss.

## 2017-04-05 NOTE — Telephone Encounter (Signed)
See 04/04/17 phone note.

## 2017-04-05 NOTE — Telephone Encounter (Signed)
I spoke with the patient's daughter, [name] this morning. I advised her of what is going on with the national backorder on the patient's bisoprolol- she checked with the patient and the patient currently has #46 tablets on her bisoprolol left. She gets it through mail order ([facility]).  I advised [name] that since we do not know how long the back order is and the patient is ok for another month on her current supply of bisoprolol, that when her next refill comes around, if we find out the bisoprolol is still on backorder, then we can switch her to something different. [name] is agreeable and voices understanding.

## 2017-04-13 ENCOUNTER — Telehealth: Payer: Self-pay | Admitting: Cardiovascular Disease

## 2017-04-13 ENCOUNTER — Telehealth: Payer: Self-pay | Admitting: Internal Medicine

## 2017-04-13 NOTE — Telephone Encounter (Signed)
I left a message for the patient's daughter, [name] (ok per [facility]) to call.

## 2017-04-13 NOTE — Telephone Encounter (Signed)
I reviewed the patient's symptoms with Dr. Caryl Comes. Per Dr. Caryl Comes, continue to have the patient monitor her weight and her edema.  She should stop amlodipine if she is currently taking this.  I called and left a message on Sherry's identified work #. I advised her of Dr. Olin Pia recommendations as above. I also advised if the patient's current symptoms worsen or she becomes SOB to please call us back.  I asked that she call back with any further questions/ concerns.

## 2017-04-13 NOTE — Telephone Encounter (Signed)
Pt daughter is returning your call. 

## 2017-04-13 NOTE — Telephone Encounter (Signed)
Pt c/o swelling: STAT is pt has developed SOB within 24 hours  1) How much weight have you gained and in what time span? 7lbs in a week   2) If swelling, where is the swelling located? BLE almost up to knees - feet   3) Are you currently taking a fluid pill? Not sure   4) Are you currently SOB? No   5) Do you have a log of your daily weights (if so, list)?  No   6) Have you gained 3 pounds in a day or 5 pounds in a week? yes  7) Have you traveled recently? Just moved into a condo this week on feet a lot decorating and moving in

## 2017-04-13 NOTE — Telephone Encounter (Signed)
I spoke with [name]. She reports that the patient had recently increased her hydralazine as her BP had been running up some. She started taking hydralazine 25 mg TID. She has moved to her own condo in the last week so she has been constantly up on her feet and on the go for 10-12 hours a day.  She has gained about 7 lbs in 1 week. She does have lower extremity swelling going up towards the knees.   She denies any SOB She missed her bedtime dose of hydralazine last night and her BP was 145/61 this morning, but her swelling was some better.   Per [name], the patient refused to wear compression socks and she will not sit down long enough to prop up her feet.   I inquired if the patient has been eating more sodium during her move, per [name], the patient may have been doing this. She typically will add a dose of an electrolyte replacement to her drink about 2-3 times a day as she tends to run low on her sodium and has been hospitalized for this in the past.   [name] is unsure if the patient is still taking amlodipine. She does reiterate that the patient is in no distress at all.    I advised I will review with Dr. Caryl Comes and call her back with any further recommendations.  [name] is agreeable.

## 2017-04-13 NOTE — Telephone Encounter (Signed)
Encounter created in error

## 2017-05-22 ENCOUNTER — Telehealth: Payer: Self-pay | Admitting: Internal Medicine

## 2017-05-22 MED ORDER — BISOPROLOL FUMARATE 5 MG PO TABS: 5.0000 mg | ORAL_TABLET | Freq: Every day | ORAL | 3 refills | Status: DC

## 2017-05-22 NOTE — Telephone Encounter (Signed)
New Message   Pt c/o medication issue:  1. Name of Medication: bisoprolol (ZEBETA) 5 MG tablet  2. How are you currently taking this medication (dosage and times per day)? 5mg  1/2 tablet by mouth twice a day.    3. Are you having a reaction (difficulty breathing--STAT)?    4. What is your medication issue? Patients daughter is calling because the pharmacy is not able to get the rx. They do not know when they will get it in. They are requesting a new prescription to be called in.

## 2017-05-22 NOTE — Telephone Encounter (Signed)
LVM with daughter letting her know I sent in her refill for bisoprolol.

## 2017-05-23 MED ORDER — BISOPROLOL FUMARATE 5 MG PO TABS: 2.5000 mg | ORAL_TABLET | Freq: Every day | ORAL | 3 refills | Status: DC

## 2017-05-23 NOTE — Telephone Encounter (Addendum)
Patient daughter calling back to let us know the same med was called in for refill .  Patient is out of medication and needs an alternative.  Please call [name] to discuss.

## 2017-05-23 NOTE — Telephone Encounter (Signed)
[facility] [place]. They only have the bisoprolol 10 mg tablets so patient could split the pill in half.  Called daugther. She states patient is actually taking bisoprolol 5 mg, taking 1/2 tablet (2.5 mg) by mouth two times a day. Therefore, cutting the 10 mg tablet into 4th's may not be possible.   Called [facility] in [place] and they do have the 5mg  tablets available. However, patient's daughter says it would be too difficult for the patient to get to that [place] as she is not used to going there.  [facility] [place] back. They said patient would be unable to split 10 mg tablets into 4ths. Pharmacist did say she had some 5 mg tablets on the shelf, around 15. She could fill prescription with as many as they have until the backorder comes in. I just need to resend in prescription for bisoprolol 5mg , take 1/2 tablet (2.5mg ) by mouth two times a day. Rx sent to pharmacy.  [name], patient's daughter, notified and she was very Patent attorney.

## 2017-05-23 NOTE — Addendum Note (Signed)
Addended by: Vanessa Ralphs on: 05/23/2017 10:26 AM   Modules accepted: Orders

## 2017-06-05 ENCOUNTER — Other Ambulatory Visit: Payer: Self-pay | Admitting: *Deleted

## 2017-06-05 ENCOUNTER — Telehealth: Payer: Self-pay | Admitting: Internal Medicine

## 2017-06-05 MED ORDER — BISOPROLOL FUMARATE 5 MG PO TABS: 2.5000 mg | ORAL_TABLET | Freq: Two times a day (BID) | ORAL | 0 refills | Status: DC

## 2017-06-05 MED ORDER — BISOPROLOL FUMARATE 5 MG PO TABS: 2.5000 mg | ORAL_TABLET | Freq: Two times a day (BID) | ORAL | 3 refills | Status: DC

## 2017-06-05 NOTE — Telephone Encounter (Signed)
Requested Prescriptions   Signed Prescriptions Disp Refills  . bisoprolol (ZEBETA) 5 MG tablet 90 tablet 3    Sig: Take 0.5 tablets (2.5 mg total) by mouth 2 (two) times daily.    Authorizing Provider: Deboraha Sprang    Ordering User: Britt Bottom

## 2017-06-05 NOTE — Telephone Encounter (Signed)
Pt request we send in couple day supply until pt receives mail order [facility].   [facility] [place] [facility] for alt. Medication place of Bisoprolol due to manufacturer BO.  Please advise.

## 2017-06-05 NOTE — Telephone Encounter (Signed)
°*  STAT* If patient is at the pharmacy, call can be transferred to refill team.   1. Which medications need to be refilled? (please list name of each medication and dose if known) Bisoprolol   2. Which pharmacy/location (including street and city if local pharmacy) [facility] [place]   3. Do they need a 30 day or 90 day supply?  Would like a few send to local pharmacy and then 90 day sent to [facility]

## 2017-06-06 MED ORDER — METOPROLOL TARTRATE 25 MG PO TABS: 25.0000 mg | ORAL_TABLET | Freq: Two times a day (BID) | ORAL | 3 refills | Status: DC

## 2017-06-06 NOTE — Telephone Encounter (Addendum)
Discussed alternatives to bisoprolol, per Dr. Caryl Comes, back in March with the patient's daughter:  1) Metoprolol succinate 25 mg once daily 2) Metoprolol tartrate 25 mg BID 3) or Inderal LA 60 mg once daily  At the time, the patient did have enough bisoprolol and the patient's daughter elected to wait to see if the national backorder would resolve.  The patient is currently out of bisoprolol and needs an alternative. The patient currently is on bisoprolol 5 mg- 1/2 tablet (2.5 mg) by mouth BID.  Reviewed the previous alternatives with Dr. Caryl Comes and he is still agreeable with the above as equivalent alternatives to the patient's current dose of bisoprolol.   I have sent in metoprolol tartrate 25 mg- take 1 tablet by mouth TWICE daily to [facility] [place]. I left a message on Sherry's identified voice mail that this has been sent in and apologized for the delay.

## 2017-06-06 NOTE — Telephone Encounter (Signed)
Patient daughter calling to let us know she did not need bisoprolol called in because the pharmacy is still out of this medication.  She has been asking for an alternative.  Patient is now out of medication and daughter states she is now at risk for MI/ CVA if she has to skip a dose.  Please call in an alternative rx TODAY and relay to Daughter sherry name and dose so that she can get an [facility]RGENT ref[facility] [place]  Please call daughter ASAP as she feels like this should have been resolved 2 weeks ago

## 2017-06-07 ENCOUNTER — Ambulatory Visit: Payer: Medicare Other | Admitting: Family Medicine

## 2017-06-07 VITALS — BP 201/66 | HR 56 | Ht 63.0 in | Wt 134.6 lb

## 2017-06-07 DIAGNOSIS — N390 Urinary tract infection, site not specified: Secondary | ICD-10-CM

## 2017-06-07 LAB — URINALYSIS, COMPLETE
BILIRUBIN UA: NEGATIVE
Glucose, UA: NEGATIVE
Ketones, UA: NEGATIVE
LEUKOCYTES UA: NEGATIVE
Nitrite, UA: NEGATIVE
PH UA: 5.5 (ref 5.0–7.5)
Protein, UA: NEGATIVE
RBC UA: NEGATIVE
Specific Gravity, UA: 1.01 (ref 1.005–1.030)
UUROB: 0.2 mg/dL (ref 0.2–1.0)

## 2017-06-07 LAB — MICROSCOPIC EXAMINATION
RBC MICROSCOPIC, UA: NONE SEEN /HPF (ref 0–2)
WBC UA: NONE SEEN /HPF (ref 0–5)

## 2017-06-07 NOTE — Progress Notes (Signed)
Patient presents today with urinary frequency, flank pain. She denies fever and chills. She was on Keflex in February, she takes a daily dose of Nitrofurantoin daily. She has not had any Urological surgeries in the last 30 days.

## 2017-06-09 ENCOUNTER — Telehealth: Payer: Self-pay | Admitting: Urology

## 2017-06-09 NOTE — Telephone Encounter (Signed)
LMOM for [name] to inform her that her mother's urine is still pending culture. The UA was normal.

## 2017-06-09 NOTE — Telephone Encounter (Signed)
Patient's daughter [name] is calling asking for the results for her mom's culture. She wants to know if she will need an abx? Please call her back at [facility]

## 2017-06-10 LAB — CULTURE, URINE COMPREHENSIVE

## 2017-06-12 ENCOUNTER — Telehealth: Payer: Self-pay

## 2017-06-12 NOTE — Telephone Encounter (Signed)
-----   Message from Abbie Sons, MD sent at 06/10/2017  9:33 PM EDT ----- Urine culture was negative for infection

## 2017-06-12 NOTE — Telephone Encounter (Signed)
Patient's daughter Notified on vmail

## 2017-06-15 NOTE — Telephone Encounter (Signed)
error 

## 2017-07-10 ENCOUNTER — Telehealth: Payer: Self-pay | Admitting: Urology

## 2017-07-10 NOTE — Telephone Encounter (Signed)
Pt's daughter, Kerin Perna, [phone] wants to know if pt should remain on Macrodantin as a preventative.  Please give her a call.  Mom was confused.

## 2017-07-12 NOTE — Telephone Encounter (Signed)
No, long-term use of Macrodantin can cause bacterial resistance as well as cause pulmonary fibrosis.  Hollice Espy, MD

## 2017-07-13 NOTE — Telephone Encounter (Signed)
Patient's daughter notified and voiced understanding. 

## 2017-07-13 NOTE — Telephone Encounter (Signed)
LMOM for Daughter to return call.

## 2017-08-03 ENCOUNTER — Other Ambulatory Visit: Payer: Self-pay | Admitting: Internal Medicine

## 2017-10-02 ENCOUNTER — Telehealth: Payer: Self-pay | Admitting: Internal Medicine

## 2017-10-02 NOTE — Telephone Encounter (Signed)
°*  STAT* If patient is at the pharmacy, call can be transferred to refill team.   1. Which medications need to be refilled? (please list name of each medication and dose if known) Hydralazine 25 mg po BID per daughter patient taking TID   2. Which pharmacy/location (including street and city if local pharmacy) is medication to be sent to? [facility] [address] [place]   3. Do they need a 30 day or 90 day supply?  90   Patient is out of medication

## 2017-10-02 NOTE — Telephone Encounter (Signed)
Please review for refill.  

## 2017-10-03 ENCOUNTER — Other Ambulatory Visit: Payer: Self-pay

## 2017-10-03 MED ORDER — HYDRALAZINE HCL 25 MG PO TABS: 25.0000 mg | ORAL_TABLET | Freq: Three times a day (TID) | ORAL | 2 refills | Status: DC

## 2017-10-03 MED ORDER — HYDRALAZINE HCL 25 MG PO TABS: 25.0000 mg | ORAL_TABLET | Freq: Two times a day (BID) | ORAL | 2 refills | Status: DC

## 2017-10-03 NOTE — Telephone Encounter (Signed)
Do you mind calling her to ask what she is taking. I think her med list needs to be cleaned up a little bit. Her daughter had mentioned back on 04/13/17 that her mom had increased her hydralazine to TID on her own.  Can you confirm with her that she his taking 1) hydralazine 25 mg three times a day 2) also , is she taking bisoprolol or metoprolol?  Just let me know what she says and I'll fix her list.   Thanks!!

## 2017-10-03 NOTE — Telephone Encounter (Signed)
*  STAT* If patient is at the pharmacy, call can be transferred to refill team.   1. Which medications need to be refilled? (please list name of each medication and dose if known) Hydralazine  2. Which pharmacy/location (including street and city if local pharmacy) is medication to be sent to? [facility] [place]  3. Do they need a 30 day or 90 day supply? 48  PT DAUGHTER ASK THAT SOMEONE CALL HER WHEN THIS HAS BEEN CALLED IN.

## 2017-10-03 NOTE — Telephone Encounter (Signed)
Pt daughter requesting hydralazine 25 mg states her mom has been taking this TID and doing well. She is also taking Metoprolol 25 mg and wants to stay ont this instead of the Zebeta.

## 2017-10-16 ENCOUNTER — Ambulatory Visit: Payer: Medicare Other

## 2017-10-16 VITALS — BP 188/71 | HR 60 | Temp 97.4°F | Ht 63.0 in | Wt 134.0 lb

## 2017-10-16 DIAGNOSIS — N390 Urinary tract infection, site not specified: Secondary | ICD-10-CM

## 2017-10-16 NOTE — Addendum Note (Signed)
Addended by: Garnette Gunner on: 10/16/2017 05:09 PM   Modules accepted: Orders

## 2017-10-16 NOTE — Progress Notes (Signed)
Pt is present today for a nurse visit. She is complaining of urinary frequency, difficulty postponing, back/flank pain, lower abdominal pain, night sweats, and urgency. Pt denies antibiotic usage in last 30 days. Pt denies urological surgery in the past 30 days. Pt provided a clean catch urine specimen. Analysis shows no significance for infection. Urine was sent for culture.

## 2017-10-17 ENCOUNTER — Ambulatory Visit: Payer: Medicare Other

## 2017-10-17 LAB — URINALYSIS, COMPLETE
Bilirubin, UA: NEGATIVE
Glucose, UA: NEGATIVE
Ketones, UA: NEGATIVE
LEUKOCYTES UA: NEGATIVE
NITRITE UA: NEGATIVE
PH UA: 5.5 (ref 5.0–7.5)
Protein, UA: NEGATIVE
RBC, UA: NEGATIVE
Specific Gravity, UA: 1.015 (ref 1.005–1.030)
Urobilinogen, Ur: 0.2 mg/dL (ref 0.2–1.0)

## 2017-10-17 LAB — MICROSCOPIC EXAMINATION
BACTERIA UA: NONE SEEN
RBC MICROSCOPIC, UA: NONE SEEN /HPF (ref 0–2)
WBC, UA: NONE SEEN /hpf (ref 0–5)

## 2017-10-18 LAB — CULTURE, URINE COMPREHENSIVE

## 2017-10-19 ENCOUNTER — Telehealth: Payer: Self-pay | Admitting: Family Medicine

## 2017-10-19 NOTE — Telephone Encounter (Signed)
-----   Message from Hollice Espy, MD sent at 10/18/2017  4:47 PM EDT ----- This patient's urine was completely negative without any red or white blood cells.  Her urine culture ultimately grew E. coli which does not make much sense given her completely bland urine.  This is likely contaminant.  I would strongly recommend that she return to our office for a catheterized specimen to eliminate the possibility of contamination if she continues to be symptomatic.  I am not inclined to give her antibiotics based on her urinalysis being completely negative.  Hollice Espy, MD

## 2017-10-19 NOTE — Telephone Encounter (Signed)
Patient's daughter notified and voiced understanding. 

## 2017-10-26 ENCOUNTER — Other Ambulatory Visit: Payer: Self-pay | Admitting: Internal Medicine

## 2017-11-21 ENCOUNTER — Ambulatory Visit: Payer: Medicare Other | Admitting: Urology

## 2017-11-25 ENCOUNTER — Other Ambulatory Visit: Payer: Self-pay | Admitting: Internal Medicine

## 2017-12-12 NOTE — Progress Notes (Signed)
12/13/17 12:06 PM   Erin Hunt [birth year] [id number]  Referring provider: Perrin Maltese, MD [place], [place] [address]  Chief Complaint  Patient presents with  . Follow-up  . Recurrent UTI    HPI: Erin Hunt is a 84 yo F with mixed urinary incontinence returns today for management and evaluation of rUTIs. The patient's last visit with [place] was on 11/23/2016.   She has had one true confirmed UTI this year on 02/2017, E. Coli with symptoms including dysuria, gross hematuria, urgency y and frequency.  She was started on suppressive macrobid x 90 days thereafter which was well tolerated.    Her 2 UAs from 5/19 annd 9/19 were completely negative. She did, however, grow E.Coli on the UA from 09/2017 which could be a possible contaminant. These were voided specimens.   Pt was previously on Mybetriq 50 mg which she took for almost a year for OAB symptoms.  She has stopped taking this medication as it did not seem to be helping her symptoms.  She continues to have urinary urgency and frequency as well as urge incontinence.  She wears several diapers per day which are variably saturated.  This is become increasingly bothersome to her.  She had also been seen and evaluated by physical therapy and which started her doing Kegel exercises with improved SUI.  She continues taking probiotic, topical estrogen every other day, and cranberry tablets.    She does have issues with constipation and occational anal sepage.  She uses a butterfly device to help control this.   This is unchanged.     PMH: Past Medical History:  Diagnosis Date  . Allergic rhinitis   . Anemia   . Arthritis   . CAD (coronary artery disease)   . Carpal tunnel syndrome   . Cervical spine degeneration   . Diverticulosis   . Fibromyalgia   . GERD (gastroesophageal reflux disease)   . Hypertension   . IBS (irritable bowel syndrome)   . IgG gammopathy   . LBBB (left bundle branch block)   . PONV  (postoperative nausea and vomiting)   . Vitamin B12 deficiency     Surgical History: Past Surgical History:  Procedure Laterality Date  . BREAST LUMPECTOMY WITH RADIOACTIVE SEED LOCALIZATION Left 06/25/2014   Procedure: LEFT BREAST LUMPECTOMY WITH RADIOACTIVE SEED LOCALIZATION;  Surgeon: Coralie Keens, MD;  Location: [facility];  Service: General;  Laterality: Left;  . CARDIAC CATHETERIZATION    . CHOLECYSTECTOMY    . TSA    . VAGINAL HYSTERECTOMY     L ovary removed  . VESICOVAGINAL FISTULA CLOSURE W/ TAH      Home Medications:  Allergies as of 12/13/2017      Reactions   Angiotensin Receptor Blockers Other (See Comments)   hyperkalemia   Aspirin Other (See Comments)   Ecchymosis with high doses   Codeine Sulfate Nausea Only   Donepezil    Other reaction(s): Other (See Comments) Could not tolerate   Erythromycin Nausea And Vomiting   Sulfa Antibiotics Nausea Only   Sulfonamide Derivatives Nausea Only      Medication List        Accurate as of 12/13/17 12:06 PM. Always use your most recent med list.          ALIGN PO Take 1 tablet by mouth at bedtime.   amLODipine 5 MG tablet Commonly known as:  NORVASC Take 1 tablet (5 mg total) by mouth daily.  celecoxib 200 MG capsule Commonly known as:  CELEBREX Take 200 mg by mouth as needed (joint pain).   conjugated estrogens vaginal cream Commonly known as:  PREMARIN Place 1 Applicatorful vaginally daily. Use pea sized amount M-W-Fr before bedtime   cyclobenzaprine 10 MG tablet Commonly known as:  FLEXERIL Take 10 mg by mouth as needed.   esomeprazole 40 MG capsule Commonly known as:  NEXIUM Take 40 mg by mouth daily at 12 noon.   estropipate 1.5 MG tablet Commonly known as:  OGEN Take 1.5 mg by mouth every morning.   hydrALAZINE 25 MG tablet Commonly known as:  APRESOLINE TAKE 1 TABLET BY MOUTH THREE TIMES A DAY   hyoscyamine 0.125 MG SL tablet Commonly known as:  LEVSIN SL Take 2  tablets by mouth twice daily as needed for stomach cramps   levocetirizine 5 MG tablet Commonly known as:  XYZAL Take 5 mg by mouth every evening.   magnesium gluconate 500 MG tablet Commonly known as:  MAGONATE Take 500 mg by mouth daily.   Melatonin 1 MG Tabs Take by mouth. Use as needed per bottle for insomnia   metoprolol tartrate 25 MG tablet Commonly known as:  LOPRESSOR TAKE 1 TABLET BY MOUTH TWICE A DAY   MYRBETRIQ 50 MG Tb24 tablet Generic drug:  mirabegron ER TAKE 1 TABLET DAILY.   NON FORMULARY Ultima electrolyte powder Take 1 tsp by mouth three times a day   VITAMIN D PO Take 1 tablet by mouth daily.       Allergies:  Allergies  Allergen Reactions  . Angiotensin Receptor Blockers Other (See Comments)    hyperkalemia  . Aspirin Other (See Comments)    Ecchymosis with high doses  . Codeine Sulfate Nausea Only  . Donepezil     Other reaction(s): Other (See Comments) Could not tolerate  . Erythromycin Nausea And Vomiting  . Sulfa Antibiotics Nausea Only  . Sulfonamide Derivatives Nausea Only    Family History: Family History  Problem Relation Age of Onset  . Diverticulosis Mother   . Coronary artery disease Father   . Heart disease Paternal Aunt   . Heart disease Paternal Uncle   . Breast cancer Paternal Aunt   . Prostate cancer Brother   . Leukemia Brother   . Heart attack Brother   . Hypertension Brother   . Stroke Brother     Social History:  reports that she has never smoked. She has never used smokeless tobacco. She reports that she does not drink alcohol or use drugs.  ROS: UROLOGY Frequent Urination?: Yes Hard to postpone urination?: Yes Burning/pain with urination?: No Get up at night to urinate?: Yes Leakage of urine?: Yes Urine stream starts and stops?: No Trouble starting stream?: No Do you have to strain to urinate?: No Blood in urine?: No Urinary tract infection?: No Sexually transmitted disease?: No Injury to kidneys or  bladder?: No Painful intercourse?: No Weak stream?: No Currently pregnant?: No Vaginal bleeding?: No Last menstrual period?: n  Gastrointestinal Nausea?: No Vomiting?: No Indigestion/heartburn?: Yes Diarrhea?: No Constipation?: No  Constitutional Fever: No Night sweats?: No Weight loss?: No Fatigue?: Yes  Skin Skin rash/lesions?: No Itching?: No  Eyes Blurred vision?: No Double vision?: No  Ears/Nose/Throat Sore throat?: No Sinus problems?: Yes  Hematologic/Lymphatic Swollen glands?: No Easy bruising?: No  Cardiovascular Leg swelling?: No Chest pain?: No  Respiratory Cough?: No Shortness of breath?: No  Endocrine Excessive thirst?: No  Musculoskeletal Back pain?: Yes Joint pain?: Yes  Neurological Headaches?: No Dizziness?: No  Psychologic Depression?: No Anxiety?: No  Physical Exam: BP (!) 180/73 (BP Location: Left Arm)   Pulse 80   Ht 5\' 3"  (1.6 m)   Wt 136 lb 9.6 oz (62 kg)   BMI 24.20 kg/m   Constitutional:  Alert and oriented, No acute distress. HEENT: [place] AT, moist mucus membranes.  Trachea midline, no masses. Cardiovascular: No clubbing, cyanosis, or edema. Respiratory: Normal respiratory effort, no increased work of breathing. GU: No CVA tenderness Skin: No rashes, bruises or suspicious lesions. Neurologic: Grossly intact, no focal deficits, moving all 4 extremities. Psychiatric: Normal mood and affect.  Laboratory Data: Lab Results  Component Value Date   WBC 8.2 11/26/2014   HGB 13.8 11/26/2014   HCT 41.5 11/26/2014   MCV 84.6 11/26/2014   PLT 325.0 11/26/2014    Lab Results  Component Value Date   CREATININE 0.86 06/18/2015    Lab Results  Component Value Date   HGBA1C 5.8 (H) 03/26/2013    Urinalysis Results for orders placed or performed in visit on 12/13/17  Microscopic Examination  Result Value Ref Range   WBC, UA None seen 0 - 5 /hpf   RBC, UA None seen 0 - 2 /hpf   Epithelial Cells (non renal) 0-10 0  - 10 /hpf   Bacteria, UA Few (A) None seen/Few  Urinalysis, Complete  Result Value Ref Range   Specific Gravity, UA <1.005 (L) 1.005 - 1.030   pH, UA 5.5 5.0 - 7.5   Color, UA Yellow Yellow   Appearance Ur Clear Clear   Leukocytes, UA Negative Negative   Protein, UA Negative Negative/Trace   Glucose, UA Negative Negative   Ketones, UA Negative Negative   RBC, UA Negative Negative   Bilirubin, UA Negative Negative   Urobilinogen, Ur 0.2 0.2 - 1.0 mg/dL   Nitrite, UA Negative Negative   Microscopic Examination See below:      Assessment & Plan:    1. Urinary incontinence, urge/ stress incontinence/ OAB  Minimal efficacy with Myrbetriq Refractory symptoms, discussed alternatives as below PTNS AND botox injections as alternative treatments, risk and benefits of each discussed She may be interested in PTNS, she will let us know  Information given on both treatment options  2. Stress incontinence, female To new pelvic floor exercises  3. Recurrent UTI Overall, only one true infection over the past 12 months which is a definite improvement from previous year As per previous discussions, I would like her to come to our office specifically for UA/urine culture if she has signs or symptoms of infection for CATHED UA Recommend continuation of cranberry tabs twice daily, probiotic If rUTI increase then possibility of increasing cranberry tabs to [place] (higher strength) She is currently on low-dose hormone therapy, but may benefit from additional local topical estrogen cream Continues Topical estrogen, pea-sized amount to be applied per urethra Monday Wednesday Friday was prescribed, no contraindications and extensively reviewed application  Return prn.   [facility] [address], [place], [place] [address] [phone]  I, Lucas Mallow, am acting as a scribe for Dr. Hollice Espy,  I have reviewed the above  documentation for accuracy and completeness, and I agree with the above.   Hollice Espy, MD

## 2017-12-13 ENCOUNTER — Encounter

## 2017-12-13 ENCOUNTER — Encounter: Payer: Self-pay | Admitting: Urology

## 2017-12-13 ENCOUNTER — Ambulatory Visit (INDEPENDENT_AMBULATORY_CARE_PROVIDER_SITE_OTHER): Payer: Medicare Other | Admitting: Urology

## 2017-12-13 VITALS — BP 180/73 | HR 80 | Ht 63.0 in | Wt 136.6 lb

## 2017-12-13 DIAGNOSIS — N390 Urinary tract infection, site not specified: Secondary | ICD-10-CM

## 2017-12-13 DIAGNOSIS — N393 Stress incontinence (female) (male): Secondary | ICD-10-CM | POA: Diagnosis not present

## 2017-12-13 DIAGNOSIS — N3281 Overactive bladder: Secondary | ICD-10-CM

## 2017-12-13 LAB — URINALYSIS, COMPLETE
BILIRUBIN UA: NEGATIVE
Glucose, UA: NEGATIVE
Ketones, UA: NEGATIVE
Leukocytes, UA: NEGATIVE
NITRITE UA: NEGATIVE
PROTEIN UA: NEGATIVE
RBC UA: NEGATIVE
Specific Gravity, UA: <1.005 — ABNORMAL LOW (ref 1.005–1.030)
UUROB: 0.2 mg/dL (ref 0.2–1.0)
pH, UA: 5.5 (ref 5.0–7.5)

## 2017-12-13 LAB — MICROSCOPIC EXAMINATION
RBC MICROSCOPIC, UA: NONE SEEN /HPF (ref 0–2)
WBC UA: NONE SEEN /HPF (ref 0–5)

## 2017-12-25 ENCOUNTER — Other Ambulatory Visit: Payer: Self-pay | Admitting: Cardiovascular Disease

## 2017-12-28 ENCOUNTER — Ambulatory Visit (INDEPENDENT_AMBULATORY_CARE_PROVIDER_SITE_OTHER): Payer: Medicare Other | Admitting: Nurse Practitioner

## 2017-12-28 ENCOUNTER — Encounter

## 2017-12-28 ENCOUNTER — Ambulatory Visit (INDEPENDENT_AMBULATORY_CARE_PROVIDER_SITE_OTHER): Payer: Medicare Other

## 2017-12-28 ENCOUNTER — Telehealth: Payer: Self-pay | Admitting: Internal Medicine

## 2017-12-28 ENCOUNTER — Encounter: Payer: Self-pay | Admitting: Nurse Practitioner

## 2017-12-28 VITALS — BP 179/76 | HR 78 | Ht 63.0 in | Wt 137.2 lb

## 2017-12-28 DIAGNOSIS — I951 Orthostatic hypotension: Secondary | ICD-10-CM

## 2017-12-28 DIAGNOSIS — I10 Essential (primary) hypertension: Secondary | ICD-10-CM | POA: Diagnosis not present

## 2017-12-28 DIAGNOSIS — R42 Dizziness and giddiness: Secondary | ICD-10-CM | POA: Diagnosis not present

## 2017-12-28 DIAGNOSIS — I350 Nonrheumatic aortic (valve) stenosis: Secondary | ICD-10-CM | POA: Diagnosis not present

## 2017-12-28 DIAGNOSIS — R55 Syncope and collapse: Secondary | ICD-10-CM

## 2017-12-28 NOTE — Telephone Encounter (Signed)
I spoke with the patient's daughter, Erin Hunt (ok per DPR).  She states that the patient has been having worsening episodes of dizziness/ pre-syncope every morning since about the middle of last week.  She is seen by Dr. Caryl Comes for a history of orthostatic hypotension.  She does have hypertension as well, but per Erin Hunt, the patient's BP/ HR have been fairly well controlled at home.  She does tend to have low sodium so she will drink electrolyte replacement at times.  I inquired if the patient had recently been sick with any vomiting/ diarrhea.  Per Erin Hunt, the patient has IBS and did have an episode of diarrhea that lasted about a day the week before last.  The patient has been maintaining adequate hydration per Erin Hunt.   They are concerned at the patient's worsening symptoms of pre-syncope, especially when waking up in the morning.  They are requesting an appointment for the patient to be seen today. I have advised we did have an opening today at 2:30 pm with Ignacia Bayley, NP. They are agreeable with this. Appointment scheduled.  I have advised Erin Hunt to make sure that the patient does take a minute to stand in place when changing position prior to walking.  [name] voices understanding.

## 2017-12-28 NOTE — Progress Notes (Signed)
Office Visit    Patient Name: Erin Hunt Date of Encounter: 12/28/2017  Primary Care Provider:  Perrin Maltese, MD Primary Cardiologist:  Virl Axe, MD  Chief Complaint    84 year old female with a history of hypertension, orthostatic hypotension, diastolic dysfunction, left bundle branch block, and mild to moderate aortic stenosis, who presents for follow-up related to a 1 week history of worsening presyncope.  Past Medical History    Past Medical History:  Diagnosis Date  . Allergic rhinitis   . Anemia   . Aortic stenosis   . Arthritis   . Carpal tunnel syndrome   . Cervical spine degeneration   . Coronary artery calcification seen on CT scan    a. 12/2011 CT: dense coronary Ca2+; b. 06/2015 MV: EF 59%, basal infsept, mid infsept, apical spet defect, likely artifact 2/2 LBBB. No ischemia.  . Diastolic dysfunction    a. 05/2013 Echo: EF 45-50%, Gr1 DD, mild to mod MR, mild AI. Mean grad 83mmHg. Mild MR, mildly dil LA.  . Diverticulosis   . Essential hypertension   . Fibromyalgia   . GERD (gastroesophageal reflux disease)   . IBS (irritable bowel syndrome)   . IgG gammopathy   . LBBB (left bundle branch block)   . Orthostatic hypotension   . PONV (postoperative nausea and vomiting)   . Vitamin B12 deficiency    Past Surgical History:  Procedure Laterality Date  . BREAST LUMPECTOMY WITH RADIOACTIVE SEED LOCALIZATION Left 06/25/2014   Procedure: LEFT BREAST LUMPECTOMY WITH RADIOACTIVE SEED LOCALIZATION;  Surgeon: Coralie Keens, MD;  Location: [facility];  Service: General;  Laterality: Left;  . CARDIAC CATHETERIZATION    . CHOLECYSTECTOMY    . TSA    . VAGINAL HYSTERECTOMY     L ovary removed  . VESICOVAGINAL FISTULA CLOSURE W/ TAH      Allergies  Allergies  Allergen Reactions  . Angiotensin Receptor Blockers Other (See Comments)    hyperkalemia  . Aspirin Other (See Comments)    Ecchymosis with high doses  . Codeine Sulfate Nausea Only   . Donepezil     Other reaction(s): Other (See Comments) Could not tolerate  . Erythromycin Nausea And Vomiting  . Sulfa Antibiotics Nausea Only  . Sulfonamide Derivatives Nausea Only    History of Present Illness    84 year old female with the above complex past medical history who is followed in cardiology clinic for history of hypertension, orthostatic hypotension, diastolic dysfunction, left bundle branch block, and mild to moderate aortic stenosis.  She was last seen in clinic by Dr. Caryl Comes in January of this year.  She typically does quite well though has a long history of orthostatic lightheadedness in the setting of resting hypertension.  About a week or so ago, she had severe diarrhea.  In that setting, due to a history of hyponatremia, her daughter made sure that she took an electrolyte supplement.  She was seen by primary care on November 27, and underwent labs which showed normal renal function and electrolytes.  H&H were also normal that day with a normal white count.  Despite lab findings, over the past week, patient has been experiencing significant orthostatic presyncope when standing up first thing in the morning with a sensation that she will pass out if she does not get back into bed.  This eventually passes and she is able to get up later with recurrent lightheadedness but not presyncope.  Symptoms seem to lessen as the day progresses  and become more like what she is accustomed to.  Regardless, by the next morning, presyncope recurs with standing.  She has also noted that while lying in bed after these episodes, her heart is racing and beating hard.  She has not been able to check her blood pressure heart rate during these episodes.  Due to the symptoms, the office was contacted and she was placed on my schedule today.  She has also been wearing her lower extremity compression hose more regularly.  She is currently asymptomatic but was hypertensive while sitting with a blood pressure of  190/81.  This dropped to 166/75 after standing for 3 minutes and was associated with dizziness.  She has not had chest pain, dyspnea, PND, orthopnea, syncope, edema, or early satiety.  Home Medications    Prior to Admission medications   Medication Sig Start Date End Date Taking? Authorizing Provider  amLODipine (NORVASC) 5 MG tablet Take 1 tablet (5 mg total) by mouth daily. 08/29/16 12/28/17 Yes Dunn, Areta Haber, PA-C  celecoxib (CELEBREX) 200 MG capsule Take 200 mg by mouth as needed (joint pain).  06/10/13  Yes [provider]  Cholecalciferol (VITAMIN D PO) Take 1 tablet by mouth daily.   Yes [provider]  conjugated estrogens (PREMARIN) vaginal cream Place 1 Applicatorful vaginally daily. Use pea sized amount M-W-Fr before bedtime 11/23/16  Yes Hollice Espy, MD  cyclobenzaprine (FLEXERIL) 10 MG tablet Take 10 mg by mouth as needed.  05/25/13  Yes [provider]  esomeprazole (NEXIUM) 40 MG capsule Take 40 mg by mouth daily at 12 noon.   Yes [provider]  estropipate (OGEN) 1.5 MG tablet Take 1.5 mg by mouth every morning.  04/01/13  Yes [provider]  hydrALAZINE (APRESOLINE) 25 MG tablet TAKE 1 TABLET BY MOUTH THREE TIMES A DAY 12/26/17  Yes Gollan, Kathlene November, MD  hyoscyamine (LEVSIN SL) 0.125 MG SL tablet Take 2 tablets by mouth twice daily as needed for stomach cramps 05/01/13  Yes [provider]  magnesium gluconate (MAGONATE) 500 MG tablet Take 500 mg by mouth daily.    Yes [provider]  Melatonin 1 MG TABS Take by mouth. Use as needed per bottle for insomnia   Yes [provider]  metoprolol tartrate (LOPRESSOR) 25 MG tablet TAKE 1 TABLET BY MOUTH TWICE A DAY 10/26/17  Yes Deboraha Sprang, MD  NON FORMULARY Ultima electrolyte powder Take 1 tsp by mouth three times a day   Yes [provider]  Probiotic Product (ALIGN PO) Take 1 tablet by mouth at bedtime.   Yes [provider]    Review of  Systems    Orthostatic presyncope as outlined above.  She has also been experiencing tachypalpitations during these episodes.  She denies chest pain, dyspnea, PND, orthopnea, syncope, edema, or early satiety.  All other systems reviewed and are otherwise negative except as noted above.  Physical Exam    VS:  BP (!) 179/76 (BP Location: Left Arm, Patient Position: Sitting, Cuff Size: Normal)   Pulse 78   Ht 5\' 3"  (1.6 m)   Wt 137 lb 4 oz (62.3 kg)   BMI 24.31 kg/m  , BMI Body mass index is 24.31 kg/m.  Orthostatic VS for the past 24 hrs:  BP- Lying Pulse- Lying BP- Sitting Pulse- Sitting BP- Standing at 0 minutes Pulse- Standing at 0 minutes  12/28/17 1439 190/81 79 172/83 77 170/79 79   GEN: Well nourished, well developed, in no  acute distress. HEENT: normal. Neck: Supple, no JVD, carotid bruits, or masses. Cardiac: RRR, no murmurs, rubs, or gallops. No clubbing, cyanosis, edema.  Radials/DP/PT 2+ and equal bilaterally.  Respiratory:  Respirations regular and unlabored, clear to auscultation bilaterally. GI: Soft, nontender, nondistended, BS + x 4. MS: no deformity or atrophy. Skin: warm and dry, no rash. Neuro:  Strength and sensation are intact. Psych: Normal affect.  Accessory Clinical Findings    ECG personally reviewed by me today -regular sinus rhythm, 78, left axis deviation, left bundle branch block- no acute changes.  Lab work November 27 Hemoglobin 13.1, hematocrit 40.4, WBC 7.0, platelets 302 Sodium 137, potassium 4.8, chloride 100, CO2 21, BUN 20, creatinine 0.86, glucose 137 Total bilirubin 2.5, alkaline phosphatase 72, AST 20, ALT 20 TSH 3.210  Assessment & Plan    1.  Orthostatic presyncope: Patient has a long history of orthostatic hypotension and lightheadedness but over the past week, following an episode of diarrhea just prior to Thanksgiving, this is been more pronounced, specifically first thing in the morning when getting out of bed and associated with a  sensation of nearly passing out.  She has noted tachypalpitations immediately following these episodes that resolve within few minutes spontaneously.  She does not have a prior history of palpitations.  She had lab work on November 27, following the diarrheal episode, which was unremarkable.  She is orthostatic in clinic today with resting hypertension, which is not new for her.  When we checked orthostatics, blood pressure rose to 190/81 while lying and then dropped to 166/70 3:05 minutes of standing.  Heart rate did not really move much.  Given history of mild to moderate aortic stenosis with new development of presyncope, I will follow-up in echocardiogram.  In the setting of palpitations, I will also arrange for a Zio monitor.  I have recommended that she purchase an abdominal binder, which she could put on her as soon as she wakes up in the morning, before getting out of bed.  Patient is stressed that she prefers to avoid any medical therapy for orthostasis if possible we collectively agreed that an abdominal binder might be something that she could more easily get on her before she gets out of bed, unlike compression stockings.  2.  Mild to moderate aortic stenosis: In the setting of above, will follow-up echo.  3.  Tachypalpitations: In the setting of above.  We will arrange for ZIO monitor.  4.  Essential hypertension: Blood pressure persistently elevated however, in the setting of significant presyncope and orthostasis, I am reluctant to titrate anything at this point.  5.  Disposition: Follow-up monitoring and echo.  Follow-up in clinic in 1 month with Dr. Caryl Comes.   [name], NP 12/28/2017, 5:15 PM

## 2017-12-28 NOTE — Telephone Encounter (Signed)
Patient daughter Langley Gauss calling   STAT if patient feels like he/she is going to faint   1) Are you dizzy now? Yes, had an episode this morning   2) Do you feel faint or have you passed out? Very close, fell into the bed  3) Do you have any other symptoms? no  4) Have you checked your HR and BP (record if available)?  Dec 2 142/72 HR 61 Dec 4th 146/70  Patient daughter would like for patient to be seen by someone today, stressed about a possible fall.  Please call to discuss.

## 2017-12-28 NOTE — Patient Instructions (Addendum)
Medication Instructions:  Your physician recommends that you continue on your current medications as directed. Please refer to the Current Medication list given to you today.  If you need a refill on your cardiac medications before your next appointment, please call your pharmacy.   Lab work: None today   If you have labs (blood work) drawn today and your tests are completely normal, you will receive your results only by: [name] MyChart Message (if you have MyChart) OR . A paper copy in the mail If you have any lab test that is abnormal or we need to change your treatment, we will call you to review the results.  Testing/Procedures: Your physician has requested that you have an echocardiogram. Echocardiography is a painless test that uses sound waves to create images of your heart. It provides your doctor with information about the size and shape of your heart and how well your heart's chambers and valves are working. This procedure takes approximately one hour. There are no restrictions for this procedure. You may need an IV for this procedure.     Follow-Up: At [facility], you and your health needs are our priority.  As part of our continuing mission to provide you with exceptional heart care, we have created designated Provider Care Teams.  These Care Teams include your primary Cardiologist (physician) and Advanced Practice Providers (APPs -  Physician Assistants and Nurse Practitioners) who all work together to provide you with the care you need, when you need it. You will need a follow up appointment in 1 months.  Please call our office 2 months in advance to schedule this appointment.  You may see Erin Axe, MD or one of the following Advanced Practice Providers on your designated Care Team:   [name], NP [name], PA-C . [name], PA-C  Any Other Special Instructions Will Be Listed Below (If Applicable). A zio monitor (# [id number]) was placed today. It will  remain on for 14 days. You will then return monitor and event diary in provided box. It takes 1-2 weeks for report to be downloaded and returned to [place]. We will call you with the results. If monitor falls of or has orange flashing light, please call Zio for further instructions.

## 2017-12-29 ENCOUNTER — Other Ambulatory Visit: Payer: Self-pay | Admitting: Cardiovascular Disease

## 2018-01-22 ENCOUNTER — Telehealth: Payer: Self-pay | Admitting: Urology

## 2018-01-22 ENCOUNTER — Other Ambulatory Visit: Payer: Medicare Other

## 2018-01-22 DIAGNOSIS — N390 Urinary tract infection, site not specified: Secondary | ICD-10-CM

## 2018-01-22 LAB — URINALYSIS, COMPLETE
Bilirubin, UA: NEGATIVE
GLUCOSE, UA: NEGATIVE
Ketones, UA: NEGATIVE
Nitrite, UA: NEGATIVE
Protein, UA: NEGATIVE
Specific Gravity, UA: 1.02 (ref 1.005–1.030)
Urobilinogen, Ur: 0.2 mg/dL (ref 0.2–1.0)
pH, UA: 6 (ref 5.0–7.5)

## 2018-01-22 LAB — MICROSCOPIC EXAMINATION

## 2018-01-22 NOTE — Telephone Encounter (Signed)
Pt called office wanting to drop off a urine sample.  She says she's had a lot of UTI's and she knows she has another.  It hurts when she pees.  She also has backache and headache.  Pt is going to drop off urine and fill out symptom check list.  I spoke w/Okie about this.

## 2018-01-23 NOTE — Telephone Encounter (Signed)
Patient's daughter, [name] [phone]), called to inquire about the urinalysis from her mom's urine sample drop off yesterday.  I advised her that Dr. Diamantina Providence took a look at her UA yesterday and it was nitrate negative and there were only trace amounts of RBC.  He did not want to prescribe anything for her at this point, but will send out for culture.  We will contact her when the urine culture results are back.

## 2018-01-23 NOTE — Progress Notes (Signed)
SUBJECTIVE: Erin Hunt is a 84 y.o. female who complains of urinary frequency, urgency and dysuria x 2 days, without flank pain, fever, chills, or abnormal vaginal discharge or bleeding. Patient allergic to sulfa drugs and erythromycin. Patient using [facility] [place], Last cx grew out E.Coli on 10/16/2017.  PLAN: Treatment per orders - also push fluids, may use Pyridium OTC prn. Call or return to clinic prn if these symptoms worsen or fail to improve as anticipated. Will call patient with urine cx results. Consulted Dr.Sninsky.

## 2018-01-24 LAB — CULTURE, URINE COMPREHENSIVE

## 2018-01-25 ENCOUNTER — Other Ambulatory Visit: Payer: Self-pay

## 2018-01-25 ENCOUNTER — Telehealth: Payer: Self-pay | Admitting: Urology

## 2018-01-25 ENCOUNTER — Ambulatory Visit (INDEPENDENT_AMBULATORY_CARE_PROVIDER_SITE_OTHER): Payer: Medicare Other

## 2018-01-25 DIAGNOSIS — I350 Nonrheumatic aortic (valve) stenosis: Secondary | ICD-10-CM | POA: Diagnosis not present

## 2018-01-25 NOTE — Telephone Encounter (Signed)
Pt's daughter, Ginger Carne, called asking about urine culture results.  Urine was dropped on on Monday 12/30.  She said pt is also having sharpe pain with urination.  Please give daughter a call [phone]

## 2018-01-26 ENCOUNTER — Telehealth: Payer: Self-pay

## 2018-01-26 MED ORDER — NITROFURANTOIN MONOHYD MACRO 100 MG PO CAPS: 100.0000 mg | ORAL_CAPSULE | Freq: Two times a day (BID) | ORAL | 0 refills | Status: DC

## 2018-01-26 NOTE — Telephone Encounter (Signed)
Informed patients daughter [name] of culture results RX 5 days Nitrofurantoin 100mg  BID was sent to pharmacy.

## 2018-01-26 NOTE — Telephone Encounter (Signed)
Call to patient to discuss results from Echo, daughter [name] answered. Okay to release results per DPR.  She verbalized understanding. All questions answered. No new orders at this time.  Advised pt to call for any further questions or concerns

## 2018-01-26 NOTE — Telephone Encounter (Signed)
-----   Message from Theora Gianotti, NP sent at 01/26/2018  6:40 AM EST ----- Nl heart squeezing fxn with mildly stiff muscle. Aortic valve is only mildly stenotic (tight) and only mildly leaky.  Mitral valve is also mildly leaky.  None of this is likely to be contributing to lightheaded spells.

## 2018-02-01 ENCOUNTER — Telehealth: Payer: Self-pay | Admitting: Internal Medicine

## 2018-02-01 ENCOUNTER — Other Ambulatory Visit: Payer: Self-pay

## 2018-02-01 MED ORDER — METOPROLOL TARTRATE 25 MG PO TABS: 25.0000 mg | ORAL_TABLET | Freq: Two times a day (BID) | ORAL | 0 refills | Status: DC

## 2018-02-01 NOTE — Telephone Encounter (Signed)
°*  STAT* If patient is at the pharmacy, call can be transferred to refill team.   1. Which medications need to be refilled? (please list name of each medication and dose if known) metoprolol 25 mg po BID  2. Which pharmacy/location (including street and city if local pharmacy) is medication to be sent to?[facility] [address] [place]   3. Do they need a 30 day or 90 day supply? [place]

## 2018-02-01 NOTE — Telephone Encounter (Signed)
Requested Prescriptions   Signed Prescriptions Disp Refills  . metoprolol tartrate (LOPRESSOR) 25 MG tablet 180 tablet 0    Sig: Take 1 tablet (25 mg total) by mouth 2 (two) times daily.    Authorizing Provider: Deboraha Sprang    Ordering User: Janan Ridge

## 2018-02-15 ENCOUNTER — Encounter: Payer: Self-pay | Admitting: Internal Medicine

## 2018-02-15 ENCOUNTER — Ambulatory Visit (INDEPENDENT_AMBULATORY_CARE_PROVIDER_SITE_OTHER): Payer: Medicare Other | Admitting: Internal Medicine

## 2018-02-15 VITALS — BP 140/78 | HR 68 | Ht 63.0 in | Wt 137.0 lb

## 2018-02-15 DIAGNOSIS — I350 Nonrheumatic aortic (valve) stenosis: Secondary | ICD-10-CM | POA: Diagnosis not present

## 2018-02-15 DIAGNOSIS — I447 Left bundle-branch block, unspecified: Secondary | ICD-10-CM | POA: Diagnosis not present

## 2018-02-15 DIAGNOSIS — I10 Essential (primary) hypertension: Secondary | ICD-10-CM

## 2018-02-15 DIAGNOSIS — I951 Orthostatic hypotension: Secondary | ICD-10-CM

## 2018-02-15 MED ORDER — HYDRALAZINE HCL 25 MG PO TABS: ORAL_TABLET | ORAL | Status: DC

## 2018-02-15 NOTE — Patient Instructions (Addendum)
Medication Instructions:  - Your physician has recommended you make the following change in your medication:   1) Decrease hydralazine 25 mg- take 1 tablet twice daily at noon & bedtime (skip the morning dose)  If you need a refill on your cardiac medications before your next appointment, please call your pharmacy.   Lab work: - none ordered  If you have labs (blood work) drawn today and your tests are completely normal, you will receive your results only by: [name] MyChart Message (if you have MyChart) OR . A paper copy in the mail If you have any lab test that is abnormal or we need to change your treatment, we will call you to review the results.  Testing/Procedures: - none ordered  Follow-Up: At [facility], you and your health needs are our priority.  As part of our continuing mission to provide you with exceptional heart care, we have created designated Provider Care Teams.  These Care Teams include your primary Cardiologist (physician) and Advanced Practice Providers (APPs -  Physician Assistants and Nurse Practitioners) who all work together to provide you with the care you need, when you need it. . You will need a follow up appointment in 6 months with Dr. Caryl Comes Please call our office 2 months in advance to schedule this appointment.   Any Other Special Instructions Will Be Listed Below (If Applicable).  1) Raise the head of your bed 2) Wear an abdominal binder during your waking hours 3) Wear thigh sleeves during your waking hours

## 2018-02-15 NOTE — Progress Notes (Signed)
Patient Care Team: Perrin Maltese, MD as PCP - General (Internal Medicine) Deboraha Sprang, MD as PCP - Cardiology (Cardiology)   HPI  Erin Hunt is a 85 y.o. female Seen in followup of orthostatic hypotension and systolic hypertension.  Seen 7/16 and discussed non pharmacological Rx, incl blocks isometric contraction, abdominal binders, and fluids   She's had problems with edema limiting up titration of amlodipine. ARB's were not tolerated because of hyperkalemia also on  low-dose bisoprolol    Date Cr K  12/16 1.03 5.5  5/17 0.86 4.9        DATE TEST EF   3/15 echo   normal   mild AS  6/17 Echo  45-50% Mild AS (Mean G 10)   6/17 myoview  normal No ischemia  1/20 Echo  55-65% AS mild ( mean G 11)   Has struggled with orthostatic intolerance.  She saw C. Sharolyn Douglas 12/19 because of acute worsening of orthostatic intolerance.  She went to bed one night and woke up the next morning with extreme postural dizziness.  Amlodipine was discontinued.  Abdominal binder was recommended.  A ZIO patch was placed  Her symptoms have abated considerably.  She still walks with her hands on the wall for balance.  Previously she had tried raising the head of her bed but with slippery sheets she found herself sliding..  Past Medical History:  Diagnosis Date  . Allergic rhinitis   . Anemia   . Aortic stenosis   . Arthritis   . Carpal tunnel syndrome   . Cervical spine degeneration   . Coronary artery calcification seen on CT scan    a. 12/2011 CT: dense coronary Ca2+; b. 06/2015 MV: EF 59%, basal infsept, mid infsept, apical spet defect, likely artifact 2/2 LBBB. No ischemia.  . Diastolic dysfunction    a. 05/2013 Echo: EF 45-50%, Gr1 DD, mild to mod MR, mild AI. Mean grad 55mmHg. Mild MR, mildly dil LA.  . Diverticulosis   . Essential hypertension   . Fibromyalgia   . GERD (gastroesophageal reflux disease)   . IBS (irritable bowel syndrome)   . IgG gammopathy   . LBBB (left bundle  branch block)   . Orthostatic hypotension   . PONV (postoperative nausea and vomiting)   . Vitamin B12 deficiency     Past Surgical History:  Procedure Laterality Date  . BREAST LUMPECTOMY WITH RADIOACTIVE SEED LOCALIZATION Left 06/25/2014   Procedure: LEFT BREAST LUMPECTOMY WITH RADIOACTIVE SEED LOCALIZATION;  Surgeon: Coralie Keens, MD;  Location: [facility];  Service: General;  Laterality: Left;  . CARDIAC CATHETERIZATION    . CHOLECYSTECTOMY    . TSA    . VAGINAL HYSTERECTOMY     L ovary removed  . VESICOVAGINAL FISTULA CLOSURE W/ TAH      Current Outpatient Medications  Medication Sig Dispense Refill  . celecoxib (CELEBREX) 200 MG capsule Take 200 mg by mouth as needed (joint pain).     . Cholecalciferol (VITAMIN D PO) Take 1 tablet by mouth daily.    [name] conjugated estrogens (PREMARIN) vaginal cream Place 1 Applicatorful vaginally daily. Use pea sized amount M-W-Fr before bedtime 42.5 g 12  . cyclobenzaprine (FLEXERIL) 10 MG tablet Take 10 mg by mouth as needed.     [name] esomeprazole (NEXIUM) 40 MG capsule Take 40 mg by mouth daily at 12 noon.    [name] estradiol (ESTRACE) 1 MG tablet daily.     [name] estropipate (OGEN)  1.5 MG tablet Take 1.5 mg by mouth every morning.     . hydrALAZINE (APRESOLINE) 25 MG tablet TAKE 1 TABLET BY MOUTH THREE TIMES A DAY 90 tablet 0  . hyoscyamine (LEVSIN SL) 0.125 MG SL tablet Take 2 tablets by mouth twice daily as needed for stomach cramps    . magnesium gluconate (MAGONATE) 500 MG tablet Take 500 mg by mouth daily.     . Melatonin 1 MG TABS Take by mouth. Use as needed per bottle for insomnia    . metoprolol tartrate (LOPRESSOR) 25 MG tablet Take 1 tablet (25 mg total) by mouth 2 (two) times daily. 180 tablet 0  . NON FORMULARY Ultima electrolyte powder Take 1 tsp by mouth three times a day    . Probiotic Product (ALIGN PO) Take 1 tablet by mouth at bedtime.     No current facility-administered medications for this visit.     Allergies    Allergen Reactions  . Angiotensin Receptor Blockers Other (See Comments)    hyperkalemia  . Aspirin Other (See Comments)    Ecchymosis with high doses  . Codeine Sulfate Nausea Only  . Donepezil     Other reaction(s): Other (See Comments) Could not tolerate  . Erythromycin Nausea And Vomiting  . Sulfa Antibiotics Nausea Only  . Sulfonamide Derivatives Nausea Only      Review of Systems negative except from HPI and PMH  Physical Exam BP 140/78 (BP Location: Left Arm, Patient Position: Sitting, Cuff Size: Normal)   Pulse 68   Ht 5\' 3"  (1.6 m)   Wt 137 lb (62.1 kg)   BMI 24.27 kg/m  Well developed and nourished in no acute distress HENT normal Neck supple with JVP-flat Clear Regular rate and rhythm, 2-0/9 systolic ejection murmur  abd-soft with active BS No Clubbing cyanosis edema Skin-warm and dry A & Oriented  Grossly normal sensory and motor function    ECG today sinus at 68 Interval 15/12/42 Left bundle branch block Left axis deviation   Assessment and  Plan  Hypertension  Orthostatic hypotension  Aortic stenosis-mild  Hyperkalemia  LBBB  Cardiomyopathy--mild      Orthostasis was very problematic a few months ago.  She is inclined towards less medication.  Hence, we have reviewed the value of compressive wear including abdominal binders and thigh sleeves.  It may be more practical than spanks as a relates to using the bathroom.  We will also resume raising the head of her bed.  As her problems are most notable in the morning, I encouraged her to shower at night as well as to avoid her morning hydralazine.  More than 50% of 40 min was spent in counseling related to the above \

## 2018-03-13 ENCOUNTER — Ambulatory Visit: Payer: Medicare Other

## 2018-03-13 ENCOUNTER — Telehealth: Payer: Self-pay | Admitting: Urology

## 2018-03-13 VITALS — BP 187/69 | HR 66 | Temp 97.6°F | Wt 134.2 lb

## 2018-03-13 DIAGNOSIS — N390 Urinary tract infection, site not specified: Secondary | ICD-10-CM

## 2018-03-13 LAB — URINALYSIS, COMPLETE
Bilirubin, UA: NEGATIVE
Nitrite, UA: POSITIVE — AB
Specific Gravity, UA: 1.025 (ref 1.005–1.030)
Urobilinogen, Ur: 1 mg/dL (ref 0.2–1.0)
pH, UA: 5 (ref 5.0–7.5)

## 2018-03-13 LAB — MICROSCOPIC EXAMINATION: Epithelial Cells (non renal): NONE SEEN /hpf (ref 0–10)

## 2018-03-13 MED ORDER — CEPHALEXIN 500 MG PO CAPS: 500.0000 mg | ORAL_CAPSULE | Freq: Four times a day (QID) | ORAL | 0 refills | Status: AC

## 2018-03-13 NOTE — Telephone Encounter (Signed)
Daughter called pt. Has possible UTI with, burning,frequent urination and chills. Also having diarrhea.

## 2018-03-13 NOTE — Progress Notes (Signed)
SUBJECTIVE: Erin Hunt is a 85 y.o. female who complains of urinary frequency, urgency and dysuria x 4 days, without flank pain, fever, chills, or abnormal vaginal discharge or bleeding.   Urine dipstick shows positive for nitrates.  Micro exam: 30 WBC 30 RBC   PLAN: Treatment per orders - also push fluids, may use Pyridium OTC prn. Per Dr.Brandon start Keflex 500mg  QID x 5days.  Call or return to clinic prn if these symptoms worsen or fail to improve as anticipated.

## 2018-03-13 NOTE — Telephone Encounter (Signed)
Patient is scheduled for Nurse visit.

## 2018-03-16 LAB — CULTURE, URINE COMPREHENSIVE

## 2018-03-19 ENCOUNTER — Telehealth: Payer: Self-pay

## 2018-03-19 NOTE — Telephone Encounter (Signed)
-----   Message from Hollice Espy, MD sent at 03/18/2018  1:29 PM EST ----- Patient's urine culture did not grow a specific species.  This is suggestive of contamination.  Her UA however was highly suspicious for infection.  In the future, like to have her catheterized for specimen if is not done.  Hollice Espy, MD

## 2018-03-19 NOTE — Telephone Encounter (Signed)
Called pt's daughter informed her of the information below. Daughter gave verbal understanding. Uro note placed in blue sticky note that pt needs cath ua's going forward

## 2018-04-12 ENCOUNTER — Other Ambulatory Visit: Payer: Self-pay

## 2018-04-12 ENCOUNTER — Telehealth: Payer: Self-pay | Admitting: Internal Medicine

## 2018-04-12 MED ORDER — HYDRALAZINE HCL 25 MG PO TABS: ORAL_TABLET | ORAL | 1 refills | Status: DC

## 2018-04-12 NOTE — Telephone Encounter (Signed)
Please review for refill.  

## 2018-04-12 NOTE — Telephone Encounter (Signed)
Refill already sent in. 

## 2018-04-12 NOTE — Telephone Encounter (Signed)
°*  STAT* If patient is at the pharmacy, call can be transferred to refill team.   1. Which medications need to be refilled? (please list name of each medication and dose if known) Hydralazine 25 mg po BID and up to TID PRN  2. Which pharmacy/location (including street and city if local pharmacy) is medication to be sent to? [facility] [address] [place]   3. Do they need a 30 day or 90 day supply? [place]

## 2018-04-23 ENCOUNTER — Ambulatory Visit (INDEPENDENT_AMBULATORY_CARE_PROVIDER_SITE_OTHER): Payer: Medicare Other | Admitting: Urology

## 2018-04-23 ENCOUNTER — Other Ambulatory Visit: Payer: Self-pay

## 2018-04-23 ENCOUNTER — Encounter: Payer: Self-pay | Admitting: Urology

## 2018-04-23 ENCOUNTER — Telehealth: Payer: Self-pay | Admitting: Urology

## 2018-04-23 DIAGNOSIS — N390 Urinary tract infection, site not specified: Secondary | ICD-10-CM

## 2018-04-23 MED ORDER — NITROFURANTOIN MONOHYD MACRO 100 MG PO CAPS: 100.0000 mg | ORAL_CAPSULE | Freq: Two times a day (BID) | ORAL | 0 refills | Status: DC

## 2018-04-23 NOTE — Progress Notes (Signed)
Virtual Visit via Telephone Note  I connected with Janyiah L Fluegel on 04/23/18 at  2:00 PM EDT by telephone and verified that I am speaking with the correct person using two identifiers.   I discussed the limitations, risks, security and privacy concerns of performing an evaluation and management service by telephone and the availability of in person appointments. I also discussed with the patient that there may be a patient responsible charge related to this service. The patient expressed understanding and agreed to proceed.   History of Present Illness: Mrs. Hornak is an 85 year old female with a history of rUTI's.  She states that she started to have dysuria, pink-tinged urine, achy ness, slight nausea, LBP and chills over the weekend.  She states these are the typical symptoms with her UTI's.  She denied fevers, cough or SOB.  She has also not traveled outside of her house in the last three weeks and has not had any family visitors.  She states AZO has helped her symptoms.      Observations/Objective:   Assessment and Plan: 1. rUTI's - will send in a 5 day course of Macrobid to her pharmacy  Follow Up Instructions: Instructed patient that if she should experience worsening symptoms while on the antibiotic to contact us or go to the ED immediately She also needs to call us if her symptoms do not resolve after she completes her antibiotic to contact us for further instruction    I discussed the assessment and treatment plan with the patient. The patient was provided an opportunity to ask questions and all were answered. The patient agreed with the plan and demonstrated an understanding of the instructions.   The patient was advised to call back or seek an in-person evaluation if the symptoms worsen or if the condition fails to improve as anticipated.  I provided 15 minutes of non-face-to-face time during this encounter.   Rui Wordell, PA-C

## 2018-04-23 NOTE — Telephone Encounter (Signed)
Patient's daughter left a voicemail that she is urinating blood, achy,burning with urination since yesterday. [facility] [place]  Please call [name] back @ [phone]  Sharyn Lull

## 2018-04-23 NOTE — Telephone Encounter (Signed)
Please advise 

## 2018-04-23 NOTE — Telephone Encounter (Signed)
Appointment has been scheduled.

## 2018-04-23 NOTE — Telephone Encounter (Signed)
I need to do a virtual visit with her today at 2 pm.

## 2018-04-24 ENCOUNTER — Telehealth: Payer: Self-pay

## 2018-04-24 MED ORDER — METOPROLOL TARTRATE 25 MG PO TABS: 25.0000 mg | ORAL_TABLET | Freq: Two times a day (BID) | ORAL | 0 refills | Status: DC

## 2018-04-24 NOTE — Telephone Encounter (Signed)
Refill of Metoprolol Tartrate [facility]

## 2018-05-28 ENCOUNTER — Telehealth: Payer: Self-pay | Admitting: Urology

## 2018-05-28 MED ORDER — NITROFURANTOIN MONOHYD MACRO 100 MG PO CAPS: 100.0000 mg | ORAL_CAPSULE | Freq: Two times a day (BID) | ORAL | 0 refills | Status: DC

## 2018-05-28 NOTE — Telephone Encounter (Signed)
Drop off or abx?

## 2018-05-28 NOTE — Telephone Encounter (Signed)
Script sent to pharmacy.

## 2018-05-28 NOTE — Addendum Note (Signed)
Addended by: Tommy Rainwater on: 05/28/2018 04:37 PM   Modules accepted: Orders

## 2018-05-28 NOTE — Telephone Encounter (Signed)
Please let this patient know that generally I would recommend that she come in for a catheterized UA/urine culture.  This is the strongest most evidence-based way to treat her urinary tract infections appropriately.  In light of COVID-19, I can understand her not wanting to come in.  Please prescribe Macrobid 100 mg twice daily for 7 days.  Please reiterate to the patient that this is an exception to the rule and next time she calls in with UTI symptoms, will have her come in as long as it safe.   Hollice Espy, MD

## 2018-05-28 NOTE — Telephone Encounter (Signed)
Patient called with burning and pain thinks she has another infection does not want to come in. She wants something called in.   Sharyn Lull

## 2018-05-28 NOTE — Telephone Encounter (Signed)
Spoke with patient's daughter and let them know that this has been called in and that if she has symptoms again that she will need to come in. They voiced understanding.   Sharyn Lull

## 2018-07-02 ENCOUNTER — Other Ambulatory Visit: Payer: Self-pay

## 2018-07-02 ENCOUNTER — Ambulatory Visit (INDEPENDENT_AMBULATORY_CARE_PROVIDER_SITE_OTHER): Payer: Medicare Other

## 2018-07-02 ENCOUNTER — Telehealth: Payer: Self-pay | Admitting: Urology

## 2018-07-02 VITALS — BP 181/69 | HR 73 | Temp 97.6°F | Ht 63.0 in | Wt 137.0 lb

## 2018-07-02 DIAGNOSIS — N390 Urinary tract infection, site not specified: Secondary | ICD-10-CM | POA: Diagnosis not present

## 2018-07-02 LAB — MICROSCOPIC EXAMINATION: RBC, Urine: NONE SEEN /hpf (ref 0–2)

## 2018-07-02 LAB — URINALYSIS, COMPLETE
Bilirubin, UA: NEGATIVE
Glucose, UA: NEGATIVE
Ketones, UA: NEGATIVE
Leukocytes,UA: NEGATIVE
Nitrite, UA: NEGATIVE
Protein,UA: NEGATIVE
RBC, UA: NEGATIVE
Specific Gravity, UA: 1.02 (ref 1.005–1.030)
Urobilinogen, Ur: 0.2 mg/dL (ref 0.2–1.0)
pH, UA: 5.5 (ref 5.0–7.5)

## 2018-07-02 NOTE — Progress Notes (Signed)
Patient presents in office today with UTI symptoms. Patient has history of recurrent urinary tract infections. Patient c/o the following symptoms:  Urinary frequency Hard to postpone urine Burning/painful urination Leakage of urine Back and flank pain Lower abdominal pain  Chills Urinary urgency  She has been on Macrobid in the last 30 days, she is not taking any suppressive antibiotics and has had no recent urological related surgery. [facility] [place]. Her U/A today  Is unremarkable, per Larene Beach we will wait on urine culture to treat. In addition if this urine culture is negative we will proceed with having the patient scheduled for a Cysto, if positive will consider placing the patient on suppressive antibiotic.

## 2018-07-02 NOTE — Telephone Encounter (Signed)
Pt is having back pain, burning and frequency.  She was added to nurse schedule at 10 today.

## 2018-07-02 NOTE — Telephone Encounter (Signed)
Will perform cath u/a and cx upon patient's arrival.

## 2018-07-03 ENCOUNTER — Ambulatory Visit: Payer: Medicare Other

## 2018-07-04 ENCOUNTER — Telehealth: Payer: Self-pay

## 2018-07-04 DIAGNOSIS — B9620 Unspecified Escherichia coli [E. coli] as the cause of diseases classified elsewhere: Secondary | ICD-10-CM

## 2018-07-04 DIAGNOSIS — N390 Urinary tract infection, site not specified: Secondary | ICD-10-CM

## 2018-07-04 LAB — CULTURE, URINE COMPREHENSIVE

## 2018-07-04 MED ORDER — CEPHALEXIN 500 MG PO CAPS: 500.0000 mg | ORAL_CAPSULE | Freq: Two times a day (BID) | ORAL | 0 refills | Status: AC

## 2018-07-04 NOTE — Telephone Encounter (Signed)
Called pt's daughter per DPR. Informed her of the need to begin antibiotic for UTI. Also advised that daughter or patient call back to inform us if she has ever taken Hiprex.

## 2018-07-04 NOTE — Telephone Encounter (Signed)
-----   Message from Nori Riis, PA-C sent at 07/04/2018  2:09 PM EDT ----- Please let Mrs. Kunin know that her urine culture was positive for infection.  She needs to start Keflex 500 mg, BID x seven days.  Has she been on Hiprex in the past for UTI's?

## 2018-07-05 ENCOUNTER — Telehealth: Payer: Self-pay

## 2018-07-05 NOTE — Telephone Encounter (Signed)
I spoke with the patient's daughter [name] per pt's request. She states that the patient has never been on Hiprex in the past. I told her we would call them back about suppressive antibiotics going forward.

## 2018-07-12 MED ORDER — METHENAMINE HIPPURATE 1 G PO TABS: 1.0000 g | ORAL_TABLET | Freq: Two times a day (BID) | ORAL | 3 refills | Status: DC

## 2018-07-12 NOTE — Telephone Encounter (Signed)
Called pt's daughter informed her that suppressive antibiotic has been sent in. She gave verbal understanding.

## 2018-07-12 NOTE — Telephone Encounter (Signed)
I think we should give Hiprex a try to see if it stops her UTI's.  I have sent the prescription for the Hiprex 1 gram BID [facility] [place].

## 2018-07-16 ENCOUNTER — Other Ambulatory Visit: Payer: Self-pay | Admitting: Internal Medicine

## 2018-07-25 ENCOUNTER — Telehealth: Payer: Self-pay | Admitting: Family Medicine

## 2018-07-25 NOTE — Telephone Encounter (Signed)
Patient's daughter Langley Gauss called today stating her mother has had complaints of headaches and body aches and back pain. She was questioning if she needed to come in for a urine sample. Patient does not have any symptoms urine related.  With her symptoms she will not pass the COVID testing questions. Her daughter states she has not been anywhere or around anyone, she does not think this is COVID related. I spoke to [facility] about the patient and she informed me that if she were to come in a drop off a urine we will need to wait on the result of the urine culture. Langley Gauss said she will wait and see if it turns into anything urine related and if she needs to over the [place] weekend she will take her to a Urgent care to have a urine sample.

## 2018-08-01 ENCOUNTER — Telehealth: Payer: Self-pay | Admitting: Urology

## 2018-08-01 NOTE — Telephone Encounter (Signed)
Pt is on a maintenance abx.  She is beginning to get a vaginal yeast infection and wanted to know if we could send anything in for her.  [facility] [place]

## 2018-08-01 NOTE — Telephone Encounter (Signed)
Hiprex is not an antibiotic.  It is an urine "sterilizer"  It does not cause a yeast infection. Is she having a vaginal discharge?

## 2018-08-02 NOTE — Telephone Encounter (Signed)
Spoke to patient's daughter and she voiced understanding. She states her mother's only complaint was warmth in her vaginal area. She will monitor her mother's symptoms.

## 2018-08-17 ENCOUNTER — Other Ambulatory Visit: Payer: Self-pay | Admitting: Internal Medicine

## 2018-10-03 ENCOUNTER — Telehealth: Payer: Self-pay | Admitting: Urology

## 2018-10-03 NOTE — Telephone Encounter (Signed)
Pt's daughter called back and said she is feeling better disregard the previous message. No need to call her back.    Sharyn Lull

## 2018-10-03 NOTE — Telephone Encounter (Signed)
Pt called office and said she left a urine sample at [facility]).  They called in a 3 day abx.  Pt told them she wasn't supposed to take abx because Dr Erlene Quan had her on a maintenance abx.  She has called their office 3 times and they haven't returned her call.  I told someone would return her call, it probably would not be today.  Pt said she feels really bad.

## 2018-10-03 NOTE — Telephone Encounter (Signed)
Left patient's daughter a message to call per DPR. Want to know patient's symptoms and let her know we would need a UA done here to treat, or possibly a copy of a Culture report done at [facility]

## 2018-10-05 ENCOUNTER — Encounter: Payer: Self-pay | Admitting: *Deleted

## 2018-10-05 ENCOUNTER — Other Ambulatory Visit: Payer: Self-pay

## 2018-10-05 ENCOUNTER — Ambulatory Visit: Payer: Medicare Other | Admitting: *Deleted

## 2018-10-05 VITALS — BP 158/82 | HR 88 | Ht 63.0 in | Wt 136.0 lb

## 2018-10-05 DIAGNOSIS — R300 Dysuria: Secondary | ICD-10-CM

## 2018-10-05 LAB — URINALYSIS, COMPLETE
Bilirubin, UA: NEGATIVE
Glucose, UA: NEGATIVE
Ketones, UA: NEGATIVE
Leukocytes,UA: NEGATIVE
Nitrite, UA: NEGATIVE
Protein,UA: NEGATIVE
RBC, UA: NEGATIVE
Specific Gravity, UA: 1.02 (ref 1.005–1.030)
Urobilinogen, Ur: 0.2 mg/dL (ref 0.2–1.0)
pH, UA: 5.5 (ref 5.0–7.5)

## 2018-10-05 LAB — MICROSCOPIC EXAMINATION: RBC, Urine: NONE SEEN /hpf (ref 0–2)

## 2018-10-05 NOTE — Progress Notes (Signed)
Patient presented today for UTI symptoms, she has been having ongoing urinary frequency/urgency, urine leakage, flank pain for two weeks with some improvement today. She was seen at her Primary care office and prescribed an antibiotic-she does not remember the name of it-She will call back to give [place] the information. She completed this on 09/30/2018 with some improvement. She states a urine culture was not ordered from their office. She is aware to stay hydrated and drink plenty of fluids per Thomas Hoff, PA Call with any changes and will be notified of the results. She verbalized understanding.

## 2018-10-06 ENCOUNTER — Other Ambulatory Visit: Payer: Self-pay | Admitting: Urology

## 2018-10-07 LAB — CULTURE, URINE COMPREHENSIVE

## 2018-10-08 ENCOUNTER — Telehealth: Payer: Self-pay | Admitting: Urology

## 2018-10-08 ENCOUNTER — Encounter: Payer: Self-pay | Admitting: Urology

## 2018-10-08 NOTE — Telephone Encounter (Signed)
Called pt's daughter per DPR, no answer. LM for pt and daughter informing them of negative urine cx.

## 2018-10-08 NOTE — Telephone Encounter (Signed)
Pt would like a call back with her UA results from 10/05/2018.

## 2018-10-10 ENCOUNTER — Telehealth: Payer: Self-pay | Admitting: Urology

## 2018-10-10 NOTE — Telephone Encounter (Signed)
Patient received a refill prescription in the mail today  for methenamine and she would like for clinical to call her because she thought she was finished with this medication. Please advise.

## 2018-10-10 NOTE — Telephone Encounter (Signed)
Left VM asked to return call

## 2018-10-11 NOTE — Telephone Encounter (Signed)
The methenamine is a preventative medication for her UTI's.  I don't see a message or an office note stating she should discontinue the medication.

## 2018-10-12 NOTE — Telephone Encounter (Signed)
Called pt's daughter (this is typically who handles the pt's information) no answer. Unable to leave voicemail as line rang busy. 2nd attempt.

## 2018-10-15 NOTE — Telephone Encounter (Signed)
Called pt's second emergency contact Langley Gauss) no answer. 3rd attempt.

## 2018-12-17 ENCOUNTER — Telehealth: Payer: Self-pay

## 2018-12-17 MED ORDER — METOPROLOL TARTRATE 25 MG PO TABS: 25.0000 mg | ORAL_TABLET | Freq: Two times a day (BID) | ORAL | 0 refills | Status: DC

## 2018-12-17 NOTE — Telephone Encounter (Signed)
Requested Prescriptions   Signed Prescriptions Disp Refills  . metoprolol tartrate (LOPRESSOR) 25 MG tablet 60 tablet 0    Sig: Take 1 tablet (25 mg total) by mouth 2 (two) times daily. *NEEDS APPOINTMENT FOR FURTHER REFILLS*    Authorizing Provider: Deboraha Sprang    Ordering User: Raelene Bott, BRANDY L

## 2019-01-08 ENCOUNTER — Other Ambulatory Visit: Payer: Self-pay | Admitting: *Deleted

## 2019-01-08 MED ORDER — HYDRALAZINE HCL 25 MG PO TABS: ORAL_TABLET | ORAL | 0 refills | Status: DC

## 2019-01-09 ENCOUNTER — Other Ambulatory Visit: Payer: Self-pay | Admitting: Internal Medicine

## 2019-01-22 ENCOUNTER — Encounter: Payer: Self-pay | Admitting: Physician Assistant

## 2019-01-22 ENCOUNTER — Other Ambulatory Visit: Payer: Self-pay

## 2019-01-22 ENCOUNTER — Ambulatory Visit (INDEPENDENT_AMBULATORY_CARE_PROVIDER_SITE_OTHER): Payer: Medicare Other | Admitting: Physician Assistant

## 2019-01-22 VITALS — BP 120/70 | HR 74 | Ht 62.0 in | Wt 136.0 lb

## 2019-01-22 DIAGNOSIS — Z87440 Personal history of urinary (tract) infections: Secondary | ICD-10-CM

## 2019-01-22 LAB — URINALYSIS, COMPLETE
Bilirubin, UA: NEGATIVE
Glucose, UA: NEGATIVE
Ketones, UA: NEGATIVE
Leukocytes,UA: NEGATIVE
Nitrite, UA: NEGATIVE
Protein,UA: NEGATIVE
RBC, UA: NEGATIVE
Specific Gravity, UA: 1.01 (ref 1.005–1.030)
Urobilinogen, Ur: 0.2 mg/dL (ref 0.2–1.0)
pH, UA: 5.5 (ref 5.0–7.5)

## 2019-01-22 LAB — MICROSCOPIC EXAMINATION: RBC, Urine: NONE SEEN /hpf (ref 0–2)

## 2019-01-22 NOTE — Progress Notes (Signed)
01/22/2019 3:54 PM   Erin Hunt [birth year] [id number]  CC: Lower abdominal pain, headache, chills  HPI: Erin Hunt is a 85 y.o. female who presents today for evaluation of possible UTI. She is an established [facility] patient who last saw Zara Council on 04/23/2018 for the same.  Urologic history significant for recurrent UTI, on Hiprex.  Today, she reports a 1 to 2-week history of lower abdominal pain that wraps around to her back as well as headache and chills.  She denies dysuria and gross hematuria.  Per her reports, her typical symptoms with UTI include lower abdominal pain, gross hematuria, and dysuria.  She denies a history of nephrolithiasis and constipation.  She has taken Celebrex at home with no symptom improvement.  In-office UA today pan-negative; urine microscopy with moderate bacteria.  PMH: Past Medical History:  Diagnosis Date  . Allergic rhinitis   . Anemia   . Aortic stenosis   . Arthritis   . Carpal tunnel syndrome   . Cervical spine degeneration   . Coronary artery calcification seen on CT scan    a. 12/2011 CT: dense coronary Ca2+; b. 06/2015 MV: EF 59%, basal infsept, mid infsept, apical spet defect, likely artifact 2/2 LBBB. No ischemia.  . Diastolic dysfunction    a. 05/2013 Echo: EF 45-50%, Gr1 DD, mild to mod MR, mild AI. Mean grad 62mmHg. Mild MR, mildly dil LA.  . Diverticulosis   . Essential hypertension   . Fibromyalgia   . GERD (gastroesophageal reflux disease)   . IBS (irritable bowel syndrome)   . IgG gammopathy   . LBBB (left bundle branch block)   . Orthostatic hypotension   . PONV (postoperative nausea and vomiting)   . Vitamin B12 deficiency     Surgical History: Past Surgical History:  Procedure Laterality Date  . BREAST LUMPECTOMY WITH RADIOACTIVE SEED LOCALIZATION Left 06/25/2014   Procedure: LEFT BREAST LUMPECTOMY WITH RADIOACTIVE SEED LOCALIZATION;  Surgeon: Coralie Keens, MD;  Location: [facility];   Service: General;  Laterality: Left;  . CARDIAC CATHETERIZATION    . CHOLECYSTECTOMY    . TSA    . VAGINAL HYSTERECTOMY     L ovary removed  . VESICOVAGINAL FISTULA CLOSURE W/ TAH      Home Medications:  Allergies as of 01/22/2019      Reactions   Angiotensin Receptor Blockers Other (See Comments)   hyperkalemia   Aspirin Other (See Comments)   Ecchymosis with high doses   Codeine Sulfate Nausea Only   Donepezil    Other reaction(s): Other (See Comments) Could not tolerate   Erythromycin Nausea And Vomiting   Sulfa Antibiotics Nausea Only   Sulfonamide Derivatives Nausea Only      Medication List       Accurate as of January 22, 2019  3:54 PM. If you have any questions, ask your nurse or doctor.        ALIGN PO Take 1 tablet by mouth at bedtime.   celecoxib 200 MG capsule Commonly known as: CELEBREX Take 200 mg by mouth as needed (joint pain).   conjugated estrogens vaginal cream Commonly known as: Premarin Place 1 Applicatorful vaginally daily. Use pea sized amount M-W-Fr before bedtime   cyclobenzaprine 10 MG tablet Commonly known as: FLEXERIL Take 10 mg by mouth as needed.   esomeprazole 40 MG capsule Commonly known as: NEXIUM Take 40 mg by mouth daily at 12 noon.   estradiol 1 MG tablet Commonly known as: ESTRACE daily.  estropipate 1.5 MG tablet Commonly known as: OGEN Take 1.5 mg by mouth every morning.   hydrALAZINE 25 MG tablet Commonly known as: APRESOLINE Take 1 tablet by mouth twice daily at noon and bedtime.   hyoscyamine 0.125 MG SL tablet Commonly known as: LEVSIN SL Take 2 tablets by mouth twice daily as needed for stomach cramps   magnesium gluconate 500 MG tablet Commonly known as: MAGONATE Take 500 mg by mouth daily.   Melatonin 1 MG Tabs Take by mouth. Use as needed per bottle for insomnia   methenamine 1 g tablet Commonly known as: HIPREX TAKE 1 TABLET (1 G TOTAL) BY MOUTH 2 (TWO) TIMES DAILY WITH A MEAL.   metoprolol  tartrate 25 MG tablet Commonly known as: LOPRESSOR TAKE 1 TABLET (25 MG TOTAL) BY MOUTH 2 (TWO) TIMES DAILY. *NEEDS APPOINTMENT FOR FURTHER REFILLS*   nitrofurantoin (macrocrystal-monohydrate) 100 MG capsule Commonly known as: MACROBID Take 1 capsule (100 mg total) by mouth 2 (two) times daily.   NON FORMULARY Ultima electrolyte powder Take 1 tsp by mouth three times a day   VITAMIN D PO Take 1 tablet by mouth daily.       Allergies:  Allergies  Allergen Reactions  . Angiotensin Receptor Blockers Other (See Comments)    hyperkalemia  . Aspirin Other (See Comments)    Ecchymosis with high doses  . Codeine Sulfate Nausea Only  . Donepezil     Other reaction(s): Other (See Comments) Could not tolerate  . Erythromycin Nausea And Vomiting  . Sulfa Antibiotics Nausea Only  . Sulfonamide Derivatives Nausea Only    Family History: Family History  Problem Relation Age of Onset  . Diverticulosis Mother   . Coronary artery disease Father   . Heart disease Paternal Aunt   . Heart disease Paternal Uncle   . Breast cancer Paternal Aunt   . Prostate cancer Brother   . Leukemia Brother   . Heart attack Brother   . Hypertension Brother   . Stroke Brother     Social History:   reports that she has never smoked. She has never used smokeless tobacco. She reports that she does not drink alcohol or use drugs.  ROS: UROLOGY Frequent Urination?: Yes Hard to postpone urination?: Yes Burning/pain with urination?: No Get up at night to urinate?: Yes Leakage of urine?: No Urine stream starts and stops?: Yes Trouble starting stream?: No Do you have to strain to urinate?: No Blood in urine?: No Urinary tract infection?: No Sexually transmitted disease?: No Injury to kidneys or bladder?: No Painful intercourse?: No Weak stream?: No Currently pregnant?: No Vaginal bleeding?: No Last menstrual period?: n  Gastrointestinal Nausea?: No Vomiting?: No Indigestion/heartburn?:  Yes Diarrhea?: No Constipation?: No  Constitutional Fever: No Night sweats?: No Weight loss?: No Fatigue?: Yes  Skin Skin rash/lesions?: No Itching?: Yes  Eyes Blurred vision?: No Double vision?: No  Ears/Nose/Throat Sore throat?: No Sinus problems?: No  Hematologic/Lymphatic Swollen glands?: No Easy bruising?: Yes  Cardiovascular Leg swelling?: No Chest pain?: No  Respiratory Cough?: Yes Shortness of breath?: No  Endocrine Excessive thirst?: No  Musculoskeletal Back pain?: Yes Joint pain?: Yes  Neurological Headaches?: Yes Dizziness?: Yes  Psychologic Depression?: No Anxiety?: No  Physical Exam: BP 120/70   Pulse 74   Ht 5\' 2"  (1.575 m)   Wt 136 lb (61.7 kg)   BMI 24.87 kg/m   Constitutional:  Alert and oriented, no acute distress, nontoxic appearing HEENT: , AT Cardiovascular: No clubbing, cyanosis, or edema  Respiratory: Normal respiratory effort, no increased work of breathing GI: Abdomen is soft, nontender, nondistended Skin: No rashes, bruises or suspicious lesions Neurologic: Grossly intact, no focal deficits, moving all 4 extremities Psychiatric: Normal mood and affect  Laboratory Data: Results for orders placed or performed in visit on 01/22/19  Microscopic Examination   URINE  Result Value Ref Range   WBC, UA 0-5 0 - 5 /hpf   RBC None seen 0 - 2 /hpf   Epithelial Cells (non renal) 0-10 0 - 10 /hpf   Bacteria, UA Moderate (A) None seen/Few  Urinalysis, Complete  Result Value Ref Range   Specific Gravity, UA 1.010 1.005 - 1.030   pH, UA 5.5 5.0 - 7.5   Color, UA Straw Yellow   Appearance Ur Clear Clear   Leukocytes,UA Negative Negative   Protein,UA Negative Negative/Trace   Glucose, UA Negative Negative   Ketones, UA Negative Negative   RBC, UA Negative Negative   Bilirubin, UA Negative Negative   Urobilinogen, Ur 0.2 0.2 - 1.0 mg/dL   Nitrite, UA Negative Negative   Microscopic Examination See below:    Assessment &  Plan:   1. History of recurrent UTI (urinary tract infection) 84 year old female with a history of recurrent UTI on Hiprex presents today with a 1 to 2-week history of lower abdominal pain radiating to the back associated with headache and chills.  UA significant only for bacteriuria; will send for culture for further evaluation.  Counseled patient to continue Hiprex in the interim.  I will contact her with her results. - Urinalysis, Complete - CULTURE, URINE COMPREHENSIVE   Return if symptoms worsen or fail to improve.  Debroah Loop, PA-C  [facility] [address] [place], [place] [address] [phone]

## 2019-01-24 ENCOUNTER — Telehealth: Payer: Self-pay | Admitting: Physician Assistant

## 2019-01-24 LAB — CULTURE, URINE COMPREHENSIVE

## 2019-01-24 NOTE — Telephone Encounter (Signed)
I just spoke with the patient's daughter, [name], to report the results of her urine culture.  I explained that her culture was negative.  Her mother does not have a UTI requiring treatment.  I encouraged her to follow-up with her primary care provider if she continues to feel ill.  She expressed understanding.

## 2019-03-25 ENCOUNTER — Other Ambulatory Visit: Payer: Self-pay

## 2019-03-25 ENCOUNTER — Telehealth: Payer: Self-pay | Admitting: Internal Medicine

## 2019-03-25 ENCOUNTER — Telehealth: Payer: Self-pay | Admitting: Urology

## 2019-03-25 ENCOUNTER — Other Ambulatory Visit: Payer: Self-pay | Admitting: Urology

## 2019-03-25 NOTE — Telephone Encounter (Signed)
Patient needs to be scheduled an appt for further refills.  Thanks

## 2019-03-25 NOTE — Telephone Encounter (Signed)
There is no recent renal function studies in her chart.  If she has recent ones from her PCP, they could fax [place] the results.  If not, please have her come in for a BMP.

## 2019-03-25 NOTE — Telephone Encounter (Signed)
*  STAT* If patient is at the pharmacy, call can be transferred to refill team.   1. Which medications need to be refilled? (please list name of each medication and dose if known) metoprolol tartrate 25 mg bid  2. Which pharmacy/location (including street and city if local pharmacy) [facility] [place]   3. Do they need a 30 day or 90 day supply? 90   Please call when filled

## 2019-03-25 NOTE — Telephone Encounter (Signed)
Please see note below. 

## 2019-03-25 NOTE — Telephone Encounter (Signed)
Patient is scheduled for 4/8. Patients daughter would like to know if a 90 day refill can be sent in due to insurance payment. Patients daughter would like to be called back regardless of the decision

## 2019-03-26 ENCOUNTER — Telehealth: Payer: Self-pay | Admitting: Internal Medicine

## 2019-03-26 ENCOUNTER — Other Ambulatory Visit: Payer: Self-pay | Admitting: Internal Medicine

## 2019-03-26 NOTE — Telephone Encounter (Signed)
I spoke with the patient's daughter, [name] (ok per DPR).  She states the patient's urologist is wanting her to have a BMP drawn. They were wanting to see if this could be done the day she sees Dr. Caryl Comes- her last BMP was 12/2017 per her daughter, at her PCP office.  I have advised [name] that I will be glad to add this on for her that day.

## 2019-03-26 NOTE — Telephone Encounter (Signed)
Daughter notified and will call PCP to see if there is a recent BMP. She will call back and schedule Lab appointment if not.

## 2019-03-26 NOTE — Telephone Encounter (Signed)
Requested Prescriptions   Signed Prescriptions Disp Refills  . metoprolol tartrate (LOPRESSOR) 25 MG tablet 60 tablet 0    Sig: TAKE 1 TABLET (25 MG TOTAL) BY MOUTH 2 (TWO) TIMES DAILY. *NEEDS APPOINTMENT FOR FURTHER REFILLS*    Authorizing Provider: Deboraha Sprang    Ordering User: Britt Bottom

## 2019-03-26 NOTE — Telephone Encounter (Signed)
Patient has upcoming appt in April and wants to know if any labs needed at that time can be drawn at visit.  Her urologist wants a BMP done and she wants to do everything here at [facility].    Please advise daughte sherry if this cannot be done

## 2019-04-02 ENCOUNTER — Other Ambulatory Visit: Payer: Self-pay | Admitting: Internal Medicine

## 2019-04-17 ENCOUNTER — Other Ambulatory Visit: Payer: Self-pay | Admitting: Internal Medicine

## 2019-05-02 ENCOUNTER — Encounter: Payer: Self-pay | Admitting: Internal Medicine

## 2019-05-02 ENCOUNTER — Other Ambulatory Visit: Payer: Self-pay

## 2019-05-02 ENCOUNTER — Ambulatory Visit (INDEPENDENT_AMBULATORY_CARE_PROVIDER_SITE_OTHER): Payer: Medicare Other | Admitting: Internal Medicine

## 2019-05-02 ENCOUNTER — Telehealth: Payer: Self-pay | Admitting: Urology

## 2019-05-02 DIAGNOSIS — I429 Cardiomyopathy, unspecified: Secondary | ICD-10-CM | POA: Diagnosis not present

## 2019-05-02 DIAGNOSIS — I951 Orthostatic hypotension: Secondary | ICD-10-CM | POA: Diagnosis not present

## 2019-05-02 DIAGNOSIS — I447 Left bundle-branch block, unspecified: Secondary | ICD-10-CM | POA: Diagnosis not present

## 2019-05-02 NOTE — Progress Notes (Signed)
Patient Care Team: Perrin Maltese, MD as PCP - General (Internal Medicine) Deboraha Sprang, MD as PCP - Cardiology (Cardiology)   HPI  Erin Hunt is a 86 y.o. female Seen in followup of orthostatic hypotension and systolic hypertension.     She's had problems with edema limiting up titration of amlodipine. ARB's were not tolerated because of hyperkalemia; also on  low-dose bisoprolol  She continues to have problems with orthostasis, particularly in the morning.  She wears her abdominal binder only occasionally when she thinks she is going have a problem.  It is quite uncomfortable.  Continues with systolic supine hypertension  No dyspnea or chest pain, no peripheral edema    Date Cr K  12/16 1.03 5.5  5/17 0.86 4.9        DATE TEST EF   3/15 echo   normal   mild AS  6/17 Echo  45-50% Mild AS (Mean G 10)   6/17 myoview  normal No ischemia  1/20 Echo  55-65% AS mild ( mean G 11)        Past Medical History:  Diagnosis Date  . Allergic rhinitis   . Anemia   . Aortic stenosis   . Arthritis   . Carpal tunnel syndrome   . Cervical spine degeneration   . Coronary artery calcification seen on CT scan    a. 12/2011 CT: dense coronary Ca2+; b. 06/2015 MV: EF 59%, basal infsept, mid infsept, apical spet defect, likely artifact 2/2 LBBB. No ischemia.  . Diastolic dysfunction    a. 05/2013 Echo: EF 45-50%, Gr1 DD, mild to mod MR, mild AI. Mean grad 31mmHg. Mild MR, mildly dil LA.  . Diverticulosis   . Essential hypertension   . Fibromyalgia   . GERD (gastroesophageal reflux disease)   . IBS (irritable bowel syndrome)   . IgG gammopathy   . LBBB (left bundle branch block)   . Orthostatic hypotension   . PONV (postoperative nausea and vomiting)   . Vitamin B12 deficiency     Past Surgical History:  Procedure Laterality Date  . BREAST LUMPECTOMY WITH RADIOACTIVE SEED LOCALIZATION Left 06/25/2014   Procedure: LEFT BREAST LUMPECTOMY WITH RADIOACTIVE SEED  LOCALIZATION;  Surgeon: Coralie Keens, MD;  Location: [facility];  Service: General;  Laterality: Left;  . CARDIAC CATHETERIZATION    . CHOLECYSTECTOMY    . TSA    . VAGINAL HYSTERECTOMY     L ovary removed  . VESICOVAGINAL FISTULA CLOSURE W/ TAH      Current Outpatient Medications  Medication Sig Dispense Refill  . celecoxib (CELEBREX) 200 MG capsule Take 200 mg by mouth as needed (joint pain).     . Cholecalciferol (VITAMIN D PO) Take 1 tablet by mouth daily.    [name] conjugated estrogens (PREMARIN) vaginal cream Place 1 Applicatorful vaginally daily. Use pea sized amount M-W-Fr before bedtime (Patient taking differently: Place 1 Applicatorful vaginally as needed. Use pea sized amount M-W-Fr before bedtime) 42.5 g 12  . cyclobenzaprine (FLEXERIL) 10 MG tablet Take 10 mg by mouth as needed.     [name] esomeprazole (NEXIUM) 40 MG capsule Take 40 mg by mouth daily at 12 noon.    [name] estradiol (ESTRACE) 1 MG tablet daily.     . hydrALAZINE (APRESOLINE) 25 MG tablet TAKE 1 TABLET BY MOUTH TWICE DAILY AT NOON AND BEDTIME. 180 tablet 0  . hyoscyamine (LEVSIN SL) 0.125 MG SL tablet Take 2 tablets by mouth twice  daily as needed for stomach cramps    . magnesium gluconate (MAGONATE) 500 MG tablet Take 500 mg by mouth daily.     . Melatonin 1 MG TABS Take by mouth. Use as needed per bottle for insomnia    . methenamine (HIPREX) 1 g tablet TAKE 1 TABLET (1 G TOTAL) BY MOUTH 2 (TWO) TIMES DAILY WITH A MEAL. 180 tablet 1  . metoprolol tartrate (LOPRESSOR) 25 MG tablet TAKE 1 TABLET (25 MG TOTAL) BY MOUTH 2 (TWO) TIMES DAILY. *NEEDS APPOINTMENT FOR FURTHER REFILLS* 180 tablet 0  . NON FORMULARY Ultima electrolyte powder Take 1 tsp by mouth three times a day    . Probiotic Product (ALIGN PO) Take 1 tablet by mouth at bedtime.     No current facility-administered medications for this visit.    Allergies  Allergen Reactions  . Angiotensin Receptor Blockers Other (See Comments)    hyperkalemia   . Aspirin Other (See Comments)    Ecchymosis with high doses  . Codeine Sulfate Nausea Only  . Donepezil     Other reaction(s): Other (See Comments) Could not tolerate  . Erythromycin Nausea And Vomiting  . Sulfa Antibiotics Nausea Only  . Sulfonamide Derivatives Nausea Only      Review of Systems negative except from HPI and PMH  Physical Exam BP (!) 164/69 (BP Location: Left Arm, Patient Position: Sitting, Cuff Size: Normal)   Pulse 61   Ht 5\' 3"  (1.6 m)   Wt 134 lb 4 oz (60.9 kg)   SpO2 98%   BMI 23.78 kg/m  Well developed and nourished in no acute distress HENT normal Neck supple with JVP-  Flat  Clear Regular rate and rhythm, no murmurs or gallops Abd-soft with active BS No Clubbing cyanosis edema Skin-warm and dry A & Oriented  Grossly normal sensory and motor function  ECG sinus at 61 Interval 16/11/43 Left ventricular hypertrophy with QRS widening and repolarization abnormalities Poor R wave progression likely secondary to the same   Assessment and  Plan  Hypertension  Orthostatic hypotension  Aortic stenosis-mild  Hyperkalemia on ARB's  LBBB/IVCD  Cardiomyopathy--mild     Functional status is stable.  Continues with systolic hypertension and profound orthostatic hypotension.  Symptoms somewhat surprisingly are only modest.  Symptoms are worse in the morning.  Discussed again the importance of avoiding a fall and have suggested she use her abdominal binder in the morning.  We also discussed sizes; it may be that her currently purchased binder is too wide   Also suggested a girdle-like spanks might be useful in addition.  We will check a metabolic profile today with her history of hyperkalemia

## 2019-05-02 NOTE — Telephone Encounter (Signed)
Pt dghtr called to inform Dr. Erlene Quan that pt had labs drawn today, was advised to call the office and let us know when she had this drawn. FYI

## 2019-05-02 NOTE — Patient Instructions (Addendum)
Medication Instructions:  Your physician recommends that you continue on your current medications as directed. Please refer to the Current Medication list given to you today.  *If you need a refill on your cardiac medications before your next appointment, please call your pharmacy*   Lab Work: BMET today  If you have labs (blood work) drawn today and your tests are completely normal, you will receive your results only by: [name] MyChart Message (if you have MyChart) OR . A paper copy in the mail If you have any lab test that is abnormal or we need to change your treatment, we will call you to review the results.   Testing/Procedures: None ordered.    Follow-Up: At [facility], you and your health needs are our priority.  As part of our continuing mission to provide you with exceptional heart care, we have created designated Provider Care Teams.  These Care Teams include your primary Cardiologist (physician) and Advanced Practice Providers (APPs -  Physician Assistants and Nurse Practitioners) who all work together to provide you with the care you need, when you need it.  We recommend signing up for the patient portal called "MyChart".  Sign up information is provided on this After Visit Summary.  MyChart is used to connect with patients for Virtual Visits (Telemedicine).  Patients are able to view lab/test results, encounter notes, upcoming appointments, etc.  Non-urgent messages can be sent to your provider as well.   To learn more about what you can do with MyChart, go to [email or web address].    Your next appointment:   12 month(s)  The format for your next appointment:   In Person  Provider:   Virl Axe, MD   Other Instructions It is important to wear your Abdominal Binder

## 2019-05-03 LAB — BASIC METABOLIC PANEL
BUN/Creatinine Ratio: 25 (ref 12–28)
BUN: 23 mg/dL (ref 8–27)
CO2: 21 mmol/L (ref 20–29)
Calcium: 9.4 mg/dL (ref 8.7–10.3)
Chloride: 102 mmol/L (ref 96–106)
Creatinine, Ser: 0.91 mg/dL (ref 0.57–1.00)
GFR calc Af Amer: 65 mL/min/{1.73_m2} (ref >59)
GFR calc non Af Amer: 57 mL/min/{1.73_m2} — ABNORMAL LOW (ref >59)
Glucose: 101 mg/dL — ABNORMAL HIGH (ref 65–99)
Potassium: 5 mmol/L (ref 3.5–5.2)
Sodium: 137 mmol/L (ref 134–144)

## 2019-06-28 ENCOUNTER — Other Ambulatory Visit: Payer: Self-pay | Admitting: Internal Medicine

## 2019-07-10 ENCOUNTER — Other Ambulatory Visit: Payer: Self-pay | Admitting: Internal Medicine

## 2019-08-06 ENCOUNTER — Other Ambulatory Visit: Payer: Self-pay | Admitting: Internal Medicine

## 2019-08-15 ENCOUNTER — Other Ambulatory Visit: Payer: Self-pay | Admitting: Nurse Practitioner

## 2019-08-15 DIAGNOSIS — Z1231 Encounter for screening mammogram for malignant neoplasm of breast: Secondary | ICD-10-CM

## 2019-09-05 ENCOUNTER — Ambulatory Visit
Admission: RE | Admit: 2019-09-05 | Discharge: 2019-09-05 | Disposition: A | Discharge status: Home / Self Care | Payer: Medicare Other | Source: Ambulatory Visit | Attending: Nurse Practitioner | Admitting: Nurse Practitioner

## 2019-09-05 ENCOUNTER — Other Ambulatory Visit: Payer: Self-pay

## 2019-09-05 DIAGNOSIS — Z1231 Encounter for screening mammogram for malignant neoplasm of breast: Secondary | ICD-10-CM | POA: Insufficient documentation

## 2019-10-11 ENCOUNTER — Other Ambulatory Visit: Payer: Self-pay | Admitting: Urology

## 2019-10-30 ENCOUNTER — Telehealth: Payer: Self-pay | Admitting: Physician Assistant

## 2019-10-30 NOTE — Telephone Encounter (Signed)
Patient's daughter called the office today with complaint of possible UTI.    Scheduled a visit with Sam for 10/31/19.

## 2019-10-31 ENCOUNTER — Ambulatory Visit (INDEPENDENT_AMBULATORY_CARE_PROVIDER_SITE_OTHER): Payer: Medicare Other | Admitting: Physician Assistant

## 2019-10-31 ENCOUNTER — Other Ambulatory Visit: Payer: Self-pay

## 2019-10-31 ENCOUNTER — Encounter: Payer: Self-pay | Admitting: Physician Assistant

## 2019-10-31 VITALS — BP 190/79 | HR 64 | Ht 63.0 in | Wt 133.0 lb

## 2019-10-31 DIAGNOSIS — R300 Dysuria: Secondary | ICD-10-CM | POA: Diagnosis not present

## 2019-10-31 LAB — MICROSCOPIC EXAMINATION: WBC, UA: >30 /hpf — AB (ref 0–5)

## 2019-10-31 LAB — URINALYSIS, COMPLETE
Bilirubin, UA: NEGATIVE
Glucose, UA: NEGATIVE
Ketones, UA: NEGATIVE
Nitrite, UA: NEGATIVE
Protein,UA: NEGATIVE
Specific Gravity, UA: 1.02 (ref 1.005–1.030)
Urobilinogen, Ur: 0.2 mg/dL (ref 0.2–1.0)
pH, UA: 5.5 (ref 5.0–7.5)

## 2019-10-31 MED ORDER — NITROFURANTOIN MONOHYD MACRO 100 MG PO CAPS: 100.0000 mg | ORAL_CAPSULE | Freq: Two times a day (BID) | ORAL | 0 refills | Status: AC

## 2019-10-31 NOTE — Progress Notes (Signed)
10/31/2019 2:04 PM   Erin Hunt 1928/07/05 [id number]  CC: Chief Complaint  Patient presents with  . Urinary Tract Infection    HPI: Erin Hunt is a 86 y.o. female with PMH recurrent UTI on Hiprex who presents today for evaluation of possible UTI.  He is accompanied today by her daughter, Erin Hunt.  Today she reports a 1 week history of headache and fatigue with dysuria and bladder pain since yesterday.  She has had chills with urination.  She continues to take Hiprex twice daily and has not missed any doses.  She denies fever, nausea, and vomiting.  She denies any other UTI symptoms since her most recent visit to our clinic in December 2020.  In-office UA today positive for trace-intact blood and 2+ leukocyte esterase; urine microscopy with >30 WBCs/HPF, 3-10 RBCs/HPF, and many bacteria.   PMH: Past Medical History:  Diagnosis Date  . Allergic rhinitis   . Anemia   . Aortic stenosis   . Arthritis   . Carpal tunnel syndrome   . Cervical spine degeneration   . Coronary artery calcification seen on CT scan    a. 12/2011 CT: dense coronary Ca2+; b. 06/2015 MV: EF 59%, basal infsept, mid infsept, apical spet defect, likely artifact 2/2 LBBB. No ischemia.  . Diastolic dysfunction    a. 05/2013 Echo: EF 45-50%, Gr1 DD, mild to mod MR, mild AI. Mean grad 72mmHg. Mild MR, mildly dil LA.  . Diverticulosis   . Essential hypertension   . Fibromyalgia   . GERD (gastroesophageal reflux disease)   . IBS (irritable bowel syndrome)   . IgG gammopathy   . LBBB (left bundle branch block)   . Orthostatic hypotension   . PONV (postoperative nausea and vomiting)   . Vitamin B12 deficiency     Surgical History: Past Surgical History:  Procedure Laterality Date  . BREAST EXCISIONAL BIOPSY    . BREAST LUMPECTOMY WITH RADIOACTIVE SEED LOCALIZATION Left 06/25/2014   Procedure: LEFT BREAST LUMPECTOMY WITH RADIOACTIVE SEED LOCALIZATION;  Surgeon: Coralie Keens, MD;  Location: [facility];  Service: General;  Laterality: Left;  . CARDIAC CATHETERIZATION    . CHOLECYSTECTOMY    . TSA    . VAGINAL HYSTERECTOMY     L ovary removed  . VESICOVAGINAL FISTULA CLOSURE W/ TAH      Home Medications:  Allergies as of 10/31/2019      Reactions   Angiotensin Receptor Blockers Other (See Comments)   hyperkalemia   Aspirin Other (See Comments)   Ecchymosis with high doses   Codeine Sulfate Nausea Only   Donepezil    Other reaction(s): Other (See Comments) Could not tolerate   Erythromycin Nausea And Vomiting   Sulfa Antibiotics Nausea Only   Sulfonamide Derivatives Nausea Only      Medication List       Accurate as of October 31, 2019  2:04 PM. If you have any questions, ask your nurse or doctor.        ALIGN PO Take 1 tablet by mouth at bedtime.   celecoxib 200 MG capsule Commonly known as: CELEBREX Take 200 mg by mouth as needed (joint pain).   conjugated estrogens vaginal cream Commonly known as: Premarin Place 1 Applicatorful vaginally daily. Use pea sized amount M-W-Fr before bedtime What changed:   when to take this  reasons to take this   cyclobenzaprine 10 MG tablet Commonly known as: FLEXERIL Take 10 mg by mouth as needed.   esomeprazole  40 MG capsule Commonly known as: NEXIUM Take 40 mg by mouth daily at 12 noon.   estradiol 1 MG tablet Commonly known as: ESTRACE daily.   hydrALAZINE 25 MG tablet Commonly known as: APRESOLINE TAKE 1 TABLET BY MOUTH TWICE DAILY AT NOON AND BEDTIME.   hyoscyamine 0.125 MG SL tablet Commonly known as: LEVSIN SL Take 2 tablets by mouth twice daily as needed for stomach cramps   magnesium gluconate 500 MG tablet Commonly known as: MAGONATE Take 500 mg by mouth daily.   melatonin 1 MG Tabs tablet Take by mouth. Use as needed per bottle for insomnia   methenamine 1 g tablet Commonly known as: HIPREX TAKE 1 TABLET (1 G TOTAL) BY MOUTH 2 (TWO) TIMES DAILY WITH A MEAL.   metoprolol  tartrate 25 MG tablet Commonly known as: LOPRESSOR TAKE 1 TABLET (25 MG TOTAL) BY MOUTH 2 (TWO) TIMES DAILY. *NEEDS APPOINTMENT FOR FURTHER REFILLS*   nitrofurantoin (macrocrystal-monohydrate) 100 MG capsule Commonly known as: MACROBID Take 1 capsule (100 mg total) by mouth every 12 (twelve) hours for 5 days. Started by: Debroah Loop, PA-C   NON FORMULARY Ultima electrolyte powder Take 1 tsp by mouth three times a day   nystatin cream Commonly known as: MYCOSTATIN Apply topically 3 (three) times daily.   VITAMIN D PO Take 1 tablet by mouth daily.       Allergies:  Allergies  Allergen Reactions  . Angiotensin Receptor Blockers Other (See Comments)    hyperkalemia  . Aspirin Other (See Comments)    Ecchymosis with high doses  . Codeine Sulfate Nausea Only  . Donepezil     Other reaction(s): Other (See Comments) Could not tolerate  . Erythromycin Nausea And Vomiting  . Sulfa Antibiotics Nausea Only  . Sulfonamide Derivatives Nausea Only    Family History: Family History  Problem Relation Age of Onset  . Diverticulosis Mother   . Coronary artery disease Father   . Heart disease Paternal Aunt   . Heart disease Paternal Uncle   . Breast cancer Paternal Aunt   . Prostate cancer Brother   . Leukemia Brother   . Heart attack Brother   . Hypertension Brother   . Stroke Brother     Social History:   reports that she has never smoked. She has never used smokeless tobacco. She reports that she does not drink alcohol and does not use drugs.  Physical Exam: BP (!) 190/79 (BP Location: Left Arm, Patient Position: Sitting, Cuff Size: Normal)   Pulse 64   Ht 5\' 3"  (1.6 m)   Wt 133 lb (60.3 kg)   BMI 23.56 kg/m   Constitutional:  Alert and oriented, no acute distress, nontoxic appearing HEENT: [place], AT Cardiovascular: No clubbing, cyanosis, or edema Respiratory: Normal respiratory effort, no increased work of breathing Skin: No rashes, bruises or suspicious  lesions Neurologic: Grossly intact, no focal deficits, moving all 4 extremities Psychiatric: Normal mood and affect  Laboratory Data: Results for orders placed or performed in visit on 10/31/19  Microscopic Examination   Urine  Result Value Ref Range   WBC, UA >30 (A) 0 - 5 /hpf   RBC 3-10 (A) 0 - 2 /hpf   Epithelial Cells (non renal) 0-10 0 - 10 /hpf   Renal Epithel, UA 0-10 (A) None seen /hpf   Bacteria, UA Many (A) None seen/Few  Urinalysis, Complete  Result Value Ref Range   Specific Gravity, UA 1.020 1.005 - 1.030   pH, UA 5.5 5.0 -  7.5   Color, UA Yellow Yellow   Appearance Ur Cloudy (A) Clear   Leukocytes,UA 2+ (A) Negative   Protein,UA Negative Negative/Trace   Glucose, UA Negative Negative   Ketones, UA Negative Negative   RBC, UA Trace (A) Negative   Bilirubin, UA Negative Negative   Urobilinogen, Ur 0.2 0.2 - 1.0 mg/dL   Nitrite, UA Negative Negative   Microscopic Examination See below:    Assessment & Plan:   1. Dysuria UA grossly infected today with microscopic hematuria despite Hiprex.  Will start empiric Macrobid and send for culture for further evaluation.  Counseled patient to stop Hiprex while on antibiotics and that I would contact her via telephone if her urine culture results indicate a necessary change in therapy.  She expressed understanding.  We will follow up with repeat UA in 1 week to prove resolution of MH.  If persistent, proceed with hematuria work-up. - Urinalysis, Complete - CULTURE, URINE COMPREHENSIVE - nitrofurantoin, macrocrystal-monohydrate, (MACROBID) 100 MG capsule; Take 1 capsule (100 mg total) by mouth every 12 (twelve) hours for 5 days.  Dispense: 10 capsule; Refill: 0   Return in about 1 week (around 11/07/2019) for Lab visit for UA.  Debroah Loop, PA-C  [facility] [address], [place], [place] [address] [phone]

## 2019-10-31 NOTE — Patient Instructions (Signed)
Stop Hiprex and start Macrobid instead this afternoon. You will take Macrobid twice daily for five days. On the day that you complete Macrobid, you may resume Hiprex in the evening.  I will send your urine for culture today and call you if I need to switch your antibiotic based on your results.  I would like you to return to clinic to drop off another urine sample in 1 week to make sure that there is no more blood in your urine.

## 2019-11-03 LAB — CULTURE, URINE COMPREHENSIVE

## 2019-11-06 ENCOUNTER — Other Ambulatory Visit: Payer: Self-pay | Admitting: Family Medicine

## 2019-11-06 DIAGNOSIS — Z87440 Personal history of urinary (tract) infections: Secondary | ICD-10-CM

## 2019-11-07 ENCOUNTER — Other Ambulatory Visit: Payer: Self-pay

## 2019-11-07 ENCOUNTER — Telehealth: Payer: Self-pay | Admitting: Physician Assistant

## 2019-11-07 ENCOUNTER — Other Ambulatory Visit: Payer: Medicare Other

## 2019-11-07 DIAGNOSIS — Z87440 Personal history of urinary (tract) infections: Secondary | ICD-10-CM

## 2019-11-07 LAB — MICROSCOPIC EXAMINATION

## 2019-11-07 LAB — URINALYSIS, COMPLETE
Bilirubin, UA: NEGATIVE
Glucose, UA: NEGATIVE
Ketones, UA: NEGATIVE
Leukocytes,UA: NEGATIVE
Nitrite, UA: NEGATIVE
Protein,UA: NEGATIVE
RBC, UA: NEGATIVE
Specific Gravity, UA: 1.02 (ref 1.005–1.030)
Urobilinogen, Ur: 0.2 mg/dL (ref 0.2–1.0)
pH, UA: 5.5 (ref 5.0–7.5)

## 2019-11-07 NOTE — Telephone Encounter (Signed)
Please contact the patient and inform her that her repeat urine today looks great.  There is no more blood.  This is great news, nothing further to do.  She may follow-up with [place] as needed.

## 2019-11-08 NOTE — Telephone Encounter (Signed)
Notified patient as advised, patient verbalized understanding.  

## 2019-12-10 ENCOUNTER — Ambulatory Visit (INDEPENDENT_AMBULATORY_CARE_PROVIDER_SITE_OTHER): Payer: Medicare Other | Admitting: Physician Assistant

## 2019-12-10 ENCOUNTER — Telehealth: Payer: Self-pay | Admitting: Physician Assistant

## 2019-12-10 ENCOUNTER — Encounter: Payer: Self-pay | Admitting: Physician Assistant

## 2019-12-10 ENCOUNTER — Other Ambulatory Visit: Payer: Self-pay

## 2019-12-10 VITALS — BP 186/105 | HR 73 | Ht 63.0 in | Wt 132.0 lb

## 2019-12-10 DIAGNOSIS — N390 Urinary tract infection, site not specified: Secondary | ICD-10-CM

## 2019-12-10 LAB — URINALYSIS, COMPLETE
Bilirubin, UA: NEGATIVE
Glucose, UA: NEGATIVE
Ketones, UA: NEGATIVE
Nitrite, UA: NEGATIVE
Protein,UA: NEGATIVE
Specific Gravity, UA: 1.02 (ref 1.005–1.030)
Urobilinogen, Ur: 0.2 mg/dL (ref 0.2–1.0)
pH, UA: 5.5 (ref 5.0–7.5)

## 2019-12-10 LAB — MICROSCOPIC EXAMINATION: WBC, UA: >30 /hpf — AB (ref 0–5)

## 2019-12-10 MED ORDER — PREMARIN 0.625 MG/GM VA CREA: TOPICAL_CREAM | VAGINAL | 12 refills | Status: DC

## 2019-12-10 MED ORDER — CIPROFLOXACIN HCL 250 MG PO TABS: 250.0000 mg | ORAL_TABLET | Freq: Two times a day (BID) | ORAL | 0 refills | Status: AC

## 2019-12-10 NOTE — Progress Notes (Signed)
12/10/2019 2:27 PM   Erin Hunt Jun 12, 1928 [id number]  CC: Chief Complaint  Patient presents with  . Recurrent UTI   HPI: Erin Hunt is a 86 y.o. female with PMH recurrent UTI on Hiprex who presents today for evaluation of possible UTI.   Today she reports a 2-day history of bilateral low back pain, dysuria, and malaise.  She denies fever, chills, nausea, vomiting, and gross hematuria.  I saw her in clinic most recently on 10/31/2019 for the same.  Urine culture grew pansensitive E. coli and I treated her with Macrobid 100 mg twice daily x5 days.  She reports this medication made her feel ill.  She reports stopping topical vaginal estrogen cream approximately 6 months ago.  She continues to take cranberry supplements once daily and probiotics once daily.  She remains on Levsin and Hiprex 1 g twice daily with meals.  She took Azo yesterday and notes symptomatic improvement on this medication.  In-office UA today positive for trace intact blood and 2+ leukocyte esterase; urine microscopy with >30 WBCs/HPF, 3-10 RBCs/HPF, and moderate bacteria.  PMH: Past Medical History:  Diagnosis Date  . Allergic rhinitis   . Anemia   . Aortic stenosis   . Arthritis   . Carpal tunnel syndrome   . Cervical spine degeneration   . Coronary artery calcification seen on CT scan    a. 12/2011 CT: dense coronary Ca2+; b. 06/2015 MV: EF 59%, basal infsept, mid infsept, apical spet defect, likely artifact 2/2 LBBB. No ischemia.  . Diastolic dysfunction    a. 05/2013 Echo: EF 45-50%, Gr1 DD, mild to mod MR, mild AI. Mean grad 5mmHg. Mild MR, mildly dil LA.  . Diverticulosis   . Essential hypertension   . Fibromyalgia   . GERD (gastroesophageal reflux disease)   . IBS (irritable bowel syndrome)   . IgG gammopathy   . LBBB (left bundle branch block)   . Orthostatic hypotension   . PONV (postoperative nausea and vomiting)   . Vitamin B12 deficiency     Surgical History: Past Surgical  History:  Procedure Laterality Date  . BREAST EXCISIONAL BIOPSY    . BREAST LUMPECTOMY WITH RADIOACTIVE SEED LOCALIZATION Left 06/25/2014   Procedure: LEFT BREAST LUMPECTOMY WITH RADIOACTIVE SEED LOCALIZATION;  Surgeon: Coralie Keens, MD;  Location: [facility];  Service: General;  Laterality: Left;  . CARDIAC CATHETERIZATION    . CHOLECYSTECTOMY    . TSA    . VAGINAL HYSTERECTOMY     L ovary removed  . VESICOVAGINAL FISTULA CLOSURE W/ TAH      Home Medications:  Allergies as of 12/10/2019      Reactions   Angiotensin Receptor Blockers Other (See Comments)   hyperkalemia   Aspirin Other (See Comments)   Ecchymosis with high doses   Codeine Sulfate Nausea Only   Donepezil    Other reaction(s): Other (See Comments) Could not tolerate   Erythromycin Nausea And Vomiting   Sulfa Antibiotics Nausea Only   Sulfonamide Derivatives Nausea Only      Medication List       Accurate as of December 10, 2019  2:27 PM. If you have any questions, ask your nurse or doctor.        ALIGN PO Take 1 tablet by mouth at bedtime.   celecoxib 200 MG capsule Commonly known as: CELEBREX Take 200 mg by mouth as needed (joint pain).   conjugated estrogens vaginal cream Commonly known as: Premarin Place 1 Applicatorful vaginally  daily. Use pea sized amount M-W-Fr before bedtime What changed:   when to take this  reasons to take this   cyclobenzaprine 10 MG tablet Commonly known as: FLEXERIL Take 10 mg by mouth as needed.   esomeprazole 40 MG capsule Commonly known as: NEXIUM Take 40 mg by mouth daily at 12 noon.   estradiol 1 MG tablet Commonly known as: ESTRACE daily.   hydrALAZINE 25 MG tablet Commonly known as: APRESOLINE TAKE 1 TABLET BY MOUTH TWICE DAILY AT NOON AND BEDTIME.   hyoscyamine 0.125 MG SL tablet Commonly known as: LEVSIN SL Take 2 tablets by mouth twice daily as needed for stomach cramps   magnesium gluconate 500 MG tablet Commonly known as:  MAGONATE Take 500 mg by mouth daily.   melatonin 1 MG Tabs tablet Take by mouth. Use as needed per bottle for insomnia   methenamine 1 g tablet Commonly known as: HIPREX TAKE 1 TABLET (1 G TOTAL) BY MOUTH 2 (TWO) TIMES DAILY WITH A MEAL.   metoprolol tartrate 25 MG tablet Commonly known as: LOPRESSOR TAKE 1 TABLET (25 MG TOTAL) BY MOUTH 2 (TWO) TIMES DAILY. *NEEDS APPOINTMENT FOR FURTHER REFILLS*   NON FORMULARY Ultima electrolyte powder Take 1 tsp by mouth three times a day   nystatin cream Commonly known as: MYCOSTATIN Apply topically 3 (three) times daily.   VITAMIN D PO Take 1 tablet by mouth daily.       Allergies:  Allergies  Allergen Reactions  . Angiotensin Receptor Blockers Other (See Comments)    hyperkalemia  . Aspirin Other (See Comments)    Ecchymosis with high doses  . Codeine Sulfate Nausea Only  . Donepezil     Other reaction(s): Other (See Comments) Could not tolerate  . Erythromycin Nausea And Vomiting  . Sulfa Antibiotics Nausea Only  . Sulfonamide Derivatives Nausea Only    Family History: Family History  Problem Relation Age of Onset  . Diverticulosis Mother   . Coronary artery disease Father   . Heart disease Paternal Aunt   . Heart disease Paternal Uncle   . Breast cancer Paternal Aunt   . Prostate cancer Brother   . Leukemia Brother   . Heart attack Brother   . Hypertension Brother   . Stroke Brother     Social History:   reports that she has never smoked. She has never used smokeless tobacco. She reports that she does not drink alcohol and does not use drugs.  Physical Exam: There were no vitals taken for this visit.  Constitutional:  Alert and oriented, no acute distress, nontoxic appearing HEENT: , AT Cardiovascular: No clubbing, cyanosis, or edema Respiratory: Normal respiratory effort, no increased work of breathing Skin: No rashes, bruises or suspicious lesions Neurologic: Grossly intact, no focal deficits, moving all  4 extremities Psychiatric: Normal mood and affect  Laboratory Data: Results for orders placed or performed in visit on 12/10/19  Microscopic Examination   Urine  Result Value Ref Range   WBC, UA >30 (A) 0 - 5 /hpf   RBC 3-10 (A) 0 - 2 /hpf   Epithelial Cells (non renal) 0-10 0 - 10 /hpf   Renal Epithel, UA 0-10 (A) None seen /hpf   Bacteria, UA Moderate (A) None seen/Few  Urinalysis, Complete  Result Value Ref Range   Specific Gravity, UA 1.020 1.005 - 1.030   pH, UA 5.5 5.0 - 7.5   Color, UA Yellow Yellow   Appearance Ur Cloudy (A) Clear   Leukocytes,UA 2+ (  A) Negative   Protein,UA Negative Negative/Trace   Glucose, UA Negative Negative   Ketones, UA Negative Negative   RBC, UA Trace (A) Negative   Bilirubin, UA Negative Negative   Urobilinogen, Ur 0.2 0.2 - 1.0 mg/dL   Nitrite, UA Negative Negative   Microscopic Examination See below:    Assessment & Plan:   1. Recurrent UTI 86 year old female with a history of recurrent UTI presents with her 2nd breakthrough infection on Hiprex in approximately 6 weeks.  Will start empiric Cipro and send for culture for further evaluation with plans for repeat UA in 1 week to prove resolution of microscopic hematuria.  Counseled patient to resume topical vaginal estrogen cream, samples and prescription provided today.  She is also to continue cranberry and probiotics daily.  Counseled her to stop Hiprex while on antibiotics and resume this with completion of her treatment course.  If she continues to have frequent breakthrough infections on Hiprex and preventative regimen as above, recommend renal ultrasound and cystoscopy for further evaluation.  Patient is in agreement with this plan. - Urinalysis, Complete - ciprofloxacin (CIPRO) 250 MG tablet; Take 1 tablet (250 mg total) by mouth 2 (two) times daily for 5 days.  Dispense: 10 tablet; Refill: 0 - CULTURE, URINE COMPREHENSIVE - conjugated estrogens (PREMARIN) vaginal cream; Apply one  pea-sized amount around the opening of the urethra three times weekly.  Dispense: 30 g; Refill: 12   Return in about 1 week (around 12/17/2019) for Lab visit for UA.  Debroah Loop, PA-C  [facility] [address], [place], [place] [address] [phone]

## 2019-12-10 NOTE — Telephone Encounter (Signed)
Patient's daughter called the office today with report of patient's symptoms low back ache, burning during urination, overall not feeling good.  She thinks she may have a UTI.  I added the patient to Sam's schedule today at 2:30pm.

## 2019-12-10 NOTE — Patient Instructions (Signed)
CONTINUE daily cranberry supplements and probiotics RESTART topical vaginal estrogen cream. Apply a pea-sized amount around the opening of the urethra (where your urine comes out) three times weekly. START Cipro for treatment of a current UTI. Stop Hiprex while on this antibiotic and restart Hiprex as soon as you finish Cipro. RETURN to clinic in 1 week to provide another urine sample to make sure the blood has resolved.

## 2019-12-13 ENCOUNTER — Other Ambulatory Visit: Payer: Self-pay | Admitting: Family Medicine

## 2019-12-13 DIAGNOSIS — N390 Urinary tract infection, site not specified: Secondary | ICD-10-CM

## 2019-12-13 LAB — CULTURE, URINE COMPREHENSIVE

## 2019-12-17 ENCOUNTER — Other Ambulatory Visit: Payer: Medicare Other

## 2019-12-18 ENCOUNTER — Other Ambulatory Visit: Payer: Medicare Other

## 2019-12-18 ENCOUNTER — Other Ambulatory Visit: Payer: Self-pay

## 2019-12-18 DIAGNOSIS — N390 Urinary tract infection, site not specified: Secondary | ICD-10-CM

## 2019-12-23 ENCOUNTER — Telehealth: Payer: Self-pay | Admitting: Physician Assistant

## 2019-12-23 NOTE — Telephone Encounter (Signed)
Patient's daughter called the office during the holiday weekend to request a prescription for a 17-month supply of the generic vaginal cream to be sent to [facility].   She is out of the samples of Premarin cream and they went to the local [facility] to pick up the prescription and it was over $100.    Please call [name] at [phone].

## 2019-12-24 ENCOUNTER — Telehealth: Payer: Self-pay | Admitting: Physician Assistant

## 2019-12-24 ENCOUNTER — Other Ambulatory Visit: Payer: Self-pay | Admitting: Physician Assistant

## 2019-12-24 LAB — MICROSCOPIC EXAMINATION

## 2019-12-24 LAB — URINALYSIS, COMPLETE
Bilirubin, UA: NEGATIVE
Glucose, UA: NEGATIVE
Ketones, UA: NEGATIVE
Leukocytes,UA: NEGATIVE
Nitrite, UA: NEGATIVE
Protein,UA: NEGATIVE
RBC, UA: NEGATIVE
Specific Gravity, UA: 1.02 (ref 1.005–1.030)
Urobilinogen, Ur: 0.2 mg/dL (ref 0.2–1.0)
pH, UA: 6 (ref 5.0–7.5)

## 2019-12-24 MED ORDER — ESTRADIOL 0.1 MG/GM VA CREA: TOPICAL_CREAM | VAGINAL | 5 refills | Status: DC

## 2019-12-24 NOTE — Telephone Encounter (Signed)
I spoke with the patient's daughter, [name], via telephone.  She reports she was able to see her mother's repeat UA results on MyChart.  She agrees that it was normal and consistent with resolution of UTI.

## 2019-12-24 NOTE — Telephone Encounter (Signed)
Spoke with [name] and explained [place] and how to use it. Texted her actual Estrace coupon. Patient will pick up Estrace cream today.

## 2019-12-24 NOTE — Telephone Encounter (Signed)
On further review, it appears that her insurance does not cover Estrace or Premarin.  The cheapest way for [place] to get her this medication will be for her to use generic Estrace at a pharmacy where prices are lowest and a good Rx coupon to bring the cost down to around $33 per tube.  Prescription for generic Estrace sent to the [facility] in [facility].  Please counsel them to find the cost savings coupon on GoodRx and bring this with him to the pharmacy.  Other pharmacies in the area will charge more for this medication.

## 2019-12-24 NOTE — Telephone Encounter (Signed)
Pt daughter has called multiple times inquiring about pt's urine results from last week. Please advise.

## 2020-02-25 ENCOUNTER — Other Ambulatory Visit: Payer: Self-pay | Admitting: Internal Medicine

## 2020-02-26 NOTE — Telephone Encounter (Signed)
This is a Octa pt 

## 2020-02-26 NOTE — Telephone Encounter (Signed)
Rx request sent to pharmacy.  

## 2020-04-06 ENCOUNTER — Other Ambulatory Visit: Payer: Self-pay | Admitting: Urology

## 2020-04-06 NOTE — Telephone Encounter (Signed)
Ok to fill 

## 2020-04-16 ENCOUNTER — Other Ambulatory Visit: Payer: Self-pay | Admitting: *Deleted

## 2020-04-16 ENCOUNTER — Other Ambulatory Visit: Payer: Self-pay | Admitting: Internal Medicine

## 2020-04-16 MED ORDER — METHENAMINE HIPPURATE 1 G PO TABS
1.0000 g | ORAL_TABLET | Freq: Two times a day (BID) | ORAL | 1 refills | Status: DC
Start: 2020-04-16 — End: 2020-04-29

## 2020-04-28 ENCOUNTER — Telehealth: Payer: Self-pay | Admitting: *Deleted

## 2020-04-28 NOTE — Telephone Encounter (Signed)
Initiated Prior Auth for Botox with [employer] [id number] faxed notes [phone].

## 2020-04-29 ENCOUNTER — Other Ambulatory Visit: Payer: Self-pay | Admitting: Urology

## 2020-04-29 MED ORDER — METHENAMINE HIPPURATE 1 G PO TABS: 1.0000 g | ORAL_TABLET | Freq: Two times a day (BID) | ORAL | 1 refills | Status: DC

## 2020-04-29 NOTE — Progress Notes (Signed)
Hiprex Rx sent to pharmacy.

## 2020-04-30 ENCOUNTER — Other Ambulatory Visit: Payer: Self-pay | Admitting: Internal Medicine

## 2020-05-07 ENCOUNTER — Ambulatory Visit (INDEPENDENT_AMBULATORY_CARE_PROVIDER_SITE_OTHER): Payer: Medicare Other | Admitting: Internal Medicine

## 2020-05-07 ENCOUNTER — Encounter: Payer: Self-pay | Admitting: Internal Medicine

## 2020-05-07 VITALS — BP 196/96 | HR 66 | Ht 63.0 in | Wt 134.0 lb

## 2020-05-07 DIAGNOSIS — I429 Cardiomyopathy, unspecified: Secondary | ICD-10-CM | POA: Diagnosis not present

## 2020-05-07 DIAGNOSIS — I10 Essential (primary) hypertension: Secondary | ICD-10-CM

## 2020-05-07 DIAGNOSIS — I447 Left bundle-branch block, unspecified: Secondary | ICD-10-CM | POA: Diagnosis not present

## 2020-05-07 DIAGNOSIS — I951 Orthostatic hypotension: Secondary | ICD-10-CM | POA: Diagnosis not present

## 2020-05-07 DIAGNOSIS — I350 Nonrheumatic aortic (valve) stenosis: Secondary | ICD-10-CM

## 2020-05-07 NOTE — Progress Notes (Signed)
Patient Care Team: Perrin Maltese, MD as PCP - General (Internal Medicine) Deboraha Sprang, MD as PCP - Cardiology (Cardiology)   HPI  Erin Hunt is a 87 y.o. female Seen in followup of orthostatic hypotension and systolic hypertension.     She's had problems with edema limiting up titration of amlodipine. ARB's were not tolerated because of hyperkalemia; also on  low-dose bisoprolol    Continues with systolic supine hypertension but more recently blood pressures have been very reasonably controlled.  Was started on gabapentin recently because of back rash is some instability with her neuropathy.  She has much improved.  However, she has developed some peripheral edema which according to her daughter Loss adjuster, chartered) is a known side effect.  Has also been taking concomitant Celebrex    No dyspnea or chest pain, no peripheral edema    Date Cr K  12/16 1.03 5.5  5/17 0.86 4.9  4/21 0.91 5.0    DATE TEST EF   3/15 echo   normal   mild AS  6/17 Echo  45-50% Mild AS (Mean G 10)   6/17 myoview  normal No ischemia  1/20 Echo  55-65% AS mild ( mean G 11)        Past Medical History:  Diagnosis Date  . Allergic rhinitis   . Anemia   . Aortic stenosis   . Arthritis   . Carpal tunnel syndrome   . Cervical spine degeneration   . Coronary artery calcification seen on CT scan    a. 12/2011 CT: dense coronary Ca2+; b. 06/2015 MV: EF 59%, basal infsept, mid infsept, apical spet defect, likely artifact 2/2 LBBB. No ischemia.  . Diastolic dysfunction    a. 05/2013 Echo: EF 45-50%, Gr1 DD, mild to mod MR, mild AI. Mean grad 11mmHg. Mild MR, mildly dil LA.  . Diverticulosis   . Essential hypertension   . Fibromyalgia   . GERD (gastroesophageal reflux disease)   . IBS (irritable bowel syndrome)   . IgG gammopathy   . LBBB (left bundle branch block)   . Orthostatic hypotension   . PONV (postoperative nausea and vomiting)   . Vitamin B12 deficiency     Past Surgical History:   Procedure Laterality Date  . BREAST EXCISIONAL BIOPSY    . BREAST LUMPECTOMY WITH RADIOACTIVE SEED LOCALIZATION Left 06/25/2014   Procedure: LEFT BREAST LUMPECTOMY WITH RADIOACTIVE SEED LOCALIZATION;  Surgeon: Coralie Keens, MD;  Location: [facility];  Service: General;  Laterality: Left;  . CARDIAC CATHETERIZATION    . CHOLECYSTECTOMY    . TSA    . VAGINAL HYSTERECTOMY     L ovary removed  . VESICOVAGINAL FISTULA CLOSURE W/ TAH      Current Outpatient Medications  Medication Sig Dispense Refill  . celecoxib (CELEBREX) 200 MG capsule Take 200 mg by mouth as needed (joint pain).     . Cholecalciferol (VITAMIN D PO) Take 1 tablet by mouth daily.    . cyclobenzaprine (FLEXERIL) 10 MG tablet Take 10 mg by mouth as needed.     [name] estradiol (ESTRACE) 0.1 MG/GM vaginal cream Apply one pea-sized amount around the opening of the urethra three times weekly. 42.5 g 5  . estradiol (ESTRACE) 1 MG tablet daily.     . famotidine (PEPCID) 20 MG tablet Take 20 mg by mouth in the morning.    . gabapentin (NEURONTIN) 100 MG capsule Take 100 mg by mouth at bedtime.    [name]  hydrALAZINE (APRESOLINE) 25 MG tablet TAKE 1 TABLET BY MOUTH TWICE DAILY AT NOON AND BEDTIME. 180 tablet 0  . hyoscyamine (LEVSIN SL) 0.125 MG SL tablet Take 2 tablets by mouth twice daily as needed for stomach cramps    . magnesium gluconate (MAGONATE) 500 MG tablet Take 500 mg by mouth daily.    . Melatonin 1 MG TABS Take by mouth. Use as needed per bottle for insomnia    . methenamine (HIPREX) 1 g tablet Take 1 tablet (1 g total) by mouth 2 (two) times daily with a meal. 180 tablet 1  . metoprolol tartrate (LOPRESSOR) 25 MG tablet TAKE 1 TABLET BY MOUTH TWICE A DAY 180 tablet 0  . NON FORMULARY Ultima electrolyte powder Take 1 tsp by mouth three times a day    . nystatin cream (MYCOSTATIN) Apply topically 3 (three) times daily.    . Probiotic Product (ALIGN PO) Take 1 tablet by mouth at bedtime.     No current  facility-administered medications for this visit.    Allergies  Allergen Reactions  . Angiotensin Receptor Blockers Other (See Comments)    hyperkalemia  . Aspirin Other (See Comments)    Ecchymosis with high doses  . Codeine Sulfate Nausea Only  . Donepezil     Other reaction(s): Other (See Comments) Could not tolerate  . Erythromycin Nausea And Vomiting  . Sulfa Antibiotics Nausea Only  . Sulfonamide Derivatives Nausea Only      Review of Systems negative except from HPI and PMH  Physical Exam BP (!) 196/96   Pulse 66   Ht 5\' 3"  (1.6 m)   Wt 134 lb (60.8 kg)   BMI 23.74 kg/m  Well developed and nourished in no acute distress HENT normal Neck supple with JVP-7 cm Carotids mildly delayed but full Clear Regular rate and rhythm, 2/6 murmur with a (still) split S2 Abd-soft with active BS No Clubbing cyanosis trace edema Skin-warm and dry A & Oriented  Grossly normal sensory and motor function  ECG sinus at 66 Interval 16/12/44 Left bundle branch block/IVCD   Assessment and  Plan  Hypertension  Orthostatic hypotension  Aortic stenosis-mild  LBBB/IVCD  Cardiomyopathy--mild     Functionally remains impressively stable.  No significant symptomatic orthostasis.  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  They have hydralazine to take as needed at home.  Aortic stenosis remains mild by examination and without symptoms.  We will not repeat the echo at this time

## 2020-05-07 NOTE — Patient Instructions (Addendum)

## 2020-06-24 ENCOUNTER — Encounter: Payer: Self-pay | Admitting: Internal Medicine

## 2020-07-13 ENCOUNTER — Telehealth: Payer: Self-pay | Admitting: Internal Medicine

## 2020-07-13 MED ORDER — HYDRALAZINE HCL 25 MG PO TABS: ORAL_TABLET | ORAL | 3 refills | Status: DC

## 2020-07-13 NOTE — Telephone Encounter (Signed)
*  STAT* If patient is at the pharmacy, call can be transferred to refill team.   1. Which medications need to be refilled? (please list name of each medication and dose if known)   Hydralazine 25 mg po BID    2. Which pharmacy/location (including street and city if local pharmacy) is medication to be sent to? [facility]   3. Do they need a 30 day or 90 day supply? [place]

## 2020-07-13 NOTE — Telephone Encounter (Signed)
Requested Prescriptions   Signed Prescriptions Disp Refills   hydrALAZINE (APRESOLINE) 25 MG tablet 180 tablet 3    Sig: TAKE 1 TABLET BY MOUTH TWICE DAILY AT NOON AND BEDTIME    Authorizing Provider: Deboraha Sprang    Ordering User: Britt Bottom

## 2020-08-13 ENCOUNTER — Other Ambulatory Visit: Payer: Self-pay | Admitting: Internal Medicine

## 2020-09-04 ENCOUNTER — Other Ambulatory Visit: Payer: Self-pay | Admitting: Nurse Practitioner

## 2020-09-04 DIAGNOSIS — Z1231 Encounter for screening mammogram for malignant neoplasm of breast: Secondary | ICD-10-CM

## 2020-09-22 NOTE — Progress Notes (Signed)
09/23/2020 1:43 PM   Erin Hunt 03-Jun-1928 [id number]  Referring provider: Perrin Maltese, MD [place],  [place] [address]  Urological history: 1. rUTI's -contributing factors of age, vaginal atrophy, IBS and incontinence -documented positive urine cultures over the last year  07/27/2020 E. Coli resistant to ampicillin  12/10/2019 E. Coli resistant to Cefuroxime    10/31/2019 pan-sensitive to E. Coli -managed with Hiprex  2. Incontinence -contributing factors of age, vaginal atrophy, pelvic floor laxity and muscle relaxers -PVR 30 cc  3. Vaginal atrophy -managed with vaginal estrogen cream three nights weekly  4. Pelvic floor laxity -seen PT    Chief Complaint  Patient presents with   Recurrent UTI    HPI: Erin Hunt is a 87 y.o. female who presents today for a history of UTI's with her daughter, Langley Gauss.  They are concerned as she continues to have UTI in spite of using strategic measures to curb them.  She states that she has baseline frequency, urgency and incontinence.  She states for the last 2 days she has been having side pain lower back pain and lower abdominal discomfort along with a headache.  Her typical urinary tract infection symptoms consist of gross hematuria, dysuria, suprapubic pain, lower back pain, headaches and chills.  Urinalysis benign.  Cath UA sent for culture.    PMH: Past Medical History:  Diagnosis Date   Allergic rhinitis    Anemia    Aortic stenosis    Arthritis    Carpal tunnel syndrome    Cervical spine degeneration    Coronary artery calcification seen on CT scan    a. 12/2011 CT: dense coronary Ca2+; b. 06/2015 MV: EF 59%, basal infsept, mid infsept, apical spet defect, likely artifact 2/2 LBBB. No ischemia.   Diastolic dysfunction    a. 05/2013 Echo: EF 45-50%, Gr1 DD, mild to mod MR, mild AI. Mean grad 5mHg. Mild MR, mildly dil LA.   Diverticulosis    Essential hypertension    Fibromyalgia    GERD  (gastroesophageal reflux disease)    IBS (irritable bowel syndrome)    IgG gammopathy    LBBB (left bundle branch block)    Orthostatic hypotension    PONV (postoperative nausea and vomiting)    Vitamin B12 deficiency     Surgical History: Past Surgical History:  Procedure Laterality Date   BREAST EXCISIONAL BIOPSY     BREAST LUMPECTOMY WITH RADIOACTIVE SEED LOCALIZATION Left 06/25/2014   Procedure: LEFT BREAST LUMPECTOMY WITH RADIOACTIVE SEED LOCALIZATION;  Surgeon: DCoralie Keens MD;  Location[place] [facility]  Service: General;  Laterality: Left;   CARDIAC CATHETERIZATION     CHOLECYSTECTOMY     TSA     VAGINAL HYSTERECTOMY     L ovary removed   VESICOVAGINAL FISTULA CLOSURE W/ TAH      Home Medications:  Allergies as of 09/23/2020       Reactions   Angiotensin Receptor Blockers Other (See Comments)   hyperkalemia   Aspirin Other (See Comments)   Ecchymosis with high doses   Codeine Sulfate Nausea Only   Donepezil    Other reaction(s): Other (See Comments) Could not tolerate   Erythromycin Nausea And Vomiting   Sulfa Antibiotics Nausea Only   Sulfonamide Derivatives Nausea Only        Medication List        Accurate as of September 23, 2020  1:43 PM. If you have any questions, ask your nurse or doctor.  ALIGN PO Take 1 tablet by mouth at bedtime.   celecoxib 200 MG capsule Commonly known as: CELEBREX Take 200 mg by mouth as needed (joint pain).   cyclobenzaprine 10 MG tablet Commonly known as: FLEXERIL Take 10 mg by mouth as needed.   esomeprazole 20 MG capsule Commonly known as: NEXIUM Take 20 mg by mouth daily.   estradiol 0.1 MG/GM vaginal cream Commonly known as: ESTRACE Apply one pea-sized amount around the opening of the urethra three times weekly.   estradiol 1 MG tablet Commonly known as: ESTRACE daily.   famotidine 20 MG tablet Commonly known as: PEPCID Take 20 mg by mouth in the morning.   gabapentin 100 MG  capsule Commonly known as: NEURONTIN Take 100 mg by mouth at bedtime.   hydrALAZINE 25 MG tablet Commonly known as: APRESOLINE TAKE 1 TABLET BY MOUTH TWICE DAILY AT NOON AND BEDTIME   hyoscyamine 0.125 MG SL tablet Commonly known as: LEVSIN SL Take 2 tablets by mouth twice daily as needed for stomach cramps   magnesium gluconate 500 MG tablet Commonly known as: MAGONATE Take 500 mg by mouth daily.   melatonin 1 MG Tabs tablet Take by mouth. Use as needed per bottle for insomnia   methenamine 1 g tablet Commonly known as: HIPREX Take 1 tablet (1 g total) by mouth 2 (two) times daily with a meal.   metoprolol tartrate 25 MG tablet Commonly known as: LOPRESSOR TAKE 1 TABLET BY MOUTH TWICE DAILY   NON FORMULARY Ultima electrolyte powder Take 1 tsp by mouth three times a day   nystatin cream Commonly known as: MYCOSTATIN Apply topically 3 (three) times daily.   VITAMIN D PO Take 1 tablet by mouth daily.        Allergies:  Allergies  Allergen Reactions   Angiotensin Receptor Blockers Other (See Comments)    hyperkalemia   Aspirin Other (See Comments)    Ecchymosis with high doses   Codeine Sulfate Nausea Only   Donepezil     Other reaction(s): Other (See Comments) Could not tolerate   Erythromycin Nausea And Vomiting   Sulfa Antibiotics Nausea Only   Sulfonamide Derivatives Nausea Only    Family History: Family History  Problem Relation Age of Onset   Diverticulosis Mother    Coronary artery disease Father    Heart disease Paternal Aunt    Heart disease Paternal Uncle    Breast cancer Paternal Aunt    Prostate cancer Brother    Leukemia Brother    Heart attack Brother    Hypertension Brother    Stroke Brother     Social History:  reports that she has never smoked. She has never used smokeless tobacco. She reports that she does not drink alcohol and does not use drugs.  ROS: Pertinent ROS in HPI  Physical Exam: BP (!) 212/83   Pulse 81   Ht 5'  3" (1.6 m)   Wt 132 lb (59.9 kg)   BMI 23.38 kg/m   Constitutional:  Well nourished. Alert and oriented, No acute distress. HEENT: [place] AT, mask  in place.  Trachea midline Cardiovascular: No clubbing, cyanosis, or edema. Respiratory: Normal respiratory effort, no increased work of breathing. GU: No CVA tenderness.  No bladder fullness or masses.  Atrophy external genitalia, normal pubic hair distribution, no lesions.  Normal urethral meatus, no lesions, no prolapse, no discharge.   No urethral masses, tenderness and/or tenderness. No bladder fullness, tenderness or masses. Pale vagina mucosa, fair estrogen effect, no  discharge, no lesions, fair pelvic support, grade II cystocele and grade II rectocele noted.  Anus and perineum are without rashes or lesions.     Neurologic: Grossly intact, no focal deficits, moving all 4 extremities. Psychiatric: Normal mood and affect.    Laboratory Data: Urinalysis Component     Latest Ref Rng & Units 09/23/2020  Specific Gravity, UA     1.005 - 1.030 1.015  pH, UA     5.0 - 7.5 5.5  Color, UA     Yellow Yellow  Appearance Ur     Clear Clear  Leukocytes,UA     Negative Negative  Protein,UA     Negative/Trace Negative  Glucose, UA     Negative Negative  Ketones, UA     Negative Negative  RBC, UA     Negative Negative  Bilirubin, UA     Negative Negative  Urobilinogen, Ur     0.2 - 1.0 mg/dL 0.2  Nitrite, UA     Negative Negative  Microscopic Examination      See below:   Component     Latest Ref Rng & Units 09/23/2020  WBC, UA     0 - 5 /hpf 0-5  RBC     0 - 2 /hpf 0-2  Epithelial Cells (non renal)     0 - 10 /hpf 0-10  Bacteria, UA     None seen/Few Few    Pertinent Imaging: N/A  Assessment & Plan:    1. rUTI's -She continues to experience recurrent UTIs and spite of engaging with strategic management by increase fluid intake, vaginal estrogen cream, Hiprex and probiotics (through yogurt) -schedule RUS to rule out nidus for  UTI -schedule cysto to r ule out nidus for UTI -I have explained to the patient that they will  be scheduled for a cystoscopy in our office to evaluate their bladder.  The cystoscopy consists of passing a tube with a lens up through their urethra and into their urinary bladder.   We will inject the urethra with a lidocaine gel prior to introducing the cystoscope to help with any discomfort during the procedure.   After the procedure, they might experience blood in the urine and discomfort with urination.  This will abate after the first few voids.  I have  encouraged the patient to increase water intake  during this time.  Patient denies any allergies to lidocaine -UA -urine culture in preparation for instrumentation  2. Mixed incontinence -She is wondering if there is anything else to take for her urinary leakage, she does not take the Myrbetriq as she states she had side effects but she cannot remember what they were -Give samples of Gemtesa 75 mg daily, #28 -We will reassess once renal ultrasound and cystoscopy are completed  Return for RUS and cysto for rUTI's with Dr. Erlene Quan .  These notes generated with voice recognition software. I apologize for typographical errors.  Zara Council, PA-C  [facility] [address]  [place], [place] [address] [phone]

## 2020-09-23 ENCOUNTER — Other Ambulatory Visit: Payer: Self-pay

## 2020-09-23 ENCOUNTER — Encounter: Payer: Self-pay | Admitting: Urology

## 2020-09-23 ENCOUNTER — Ambulatory Visit (INDEPENDENT_AMBULATORY_CARE_PROVIDER_SITE_OTHER): Payer: Medicare Other | Admitting: Urology

## 2020-09-23 VITALS — BP 212/83 | HR 81 | Ht 63.0 in | Wt 132.0 lb

## 2020-09-23 DIAGNOSIS — N3946 Mixed incontinence: Secondary | ICD-10-CM

## 2020-09-23 DIAGNOSIS — N390 Urinary tract infection, site not specified: Secondary | ICD-10-CM | POA: Diagnosis not present

## 2020-09-23 MED ORDER — GEMTESA 75 MG PO TABS: 75.0000 mg | ORAL_TABLET | Freq: Every day | ORAL | 0 refills | Status: DC

## 2020-09-24 ENCOUNTER — Ambulatory Visit
Admission: RE | Admit: 2020-09-24 | Discharge: 2020-09-24 | Disposition: A | Discharge status: Home / Self Care | Payer: Medicare Other | Source: Ambulatory Visit | Attending: Nurse Practitioner | Admitting: Nurse Practitioner

## 2020-09-24 DIAGNOSIS — Z1231 Encounter for screening mammogram for malignant neoplasm of breast: Secondary | ICD-10-CM | POA: Insufficient documentation

## 2020-09-25 LAB — URINALYSIS, COMPLETE
Bilirubin, UA: NEGATIVE
Glucose, UA: NEGATIVE
Ketones, UA: NEGATIVE
Leukocytes,UA: NEGATIVE
Nitrite, UA: NEGATIVE
Protein,UA: NEGATIVE
RBC, UA: NEGATIVE
Specific Gravity, UA: 1.015 (ref 1.005–1.030)
Urobilinogen, Ur: 0.2 mg/dL (ref 0.2–1.0)
pH, UA: 5.5 (ref 5.0–7.5)

## 2020-09-25 LAB — MICROSCOPIC EXAMINATION

## 2020-09-27 LAB — CULTURE, URINE COMPREHENSIVE

## 2020-10-09 ENCOUNTER — Other Ambulatory Visit: Payer: Self-pay

## 2020-10-09 ENCOUNTER — Ambulatory Visit
Admission: RE | Admit: 2020-10-09 | Discharge: 2020-10-09 | Disposition: A | Discharge status: Home / Self Care | Payer: Medicare Other | Source: Ambulatory Visit | Attending: Urology | Admitting: Urology

## 2020-10-09 DIAGNOSIS — N390 Urinary tract infection, site not specified: Secondary | ICD-10-CM | POA: Insufficient documentation

## 2020-10-19 NOTE — Progress Notes (Signed)
10/20/20 3:22 PM   Erin Hunt 1928/06/18 [id number]  Referring provider:  Perrin Maltese, MD [place],  [place] [address] Chief Complaint  Patient presents with   Cysto     HPI: Erin Hunt is a 87 y.o.female with a personal history of recurrent UTI's, incontinence, vaginal atrophy, and pelvic floor laxity, who presents today for RUS results and cystoscopy.   She has had three document positive urine cultures over the last year. On 07/27/2020 E. Coli resistant to ampicillin, 12/10/2019 E. Coli resistant to Cefuroxime, and 10/31/2019 pan-sensitive to E. Coli. Her most recent urine culture on 09/23/2020 was negative for growth.   She was last seen by Zara Council, PA-C last month.   RUS on 10/09/2020 revealed both kidneys relatively small measure less than 9 cm and no acute abnormalities identified.  Urine today showed >30 RBCs and WBCs, and many bacteria.   She feels bad today with urinary symptoms  she has been taking AZO for her symptoms. She is accompanied by her daughter today.   PMH: Past Medical History:  Diagnosis Date   Allergic rhinitis    Anemia    Aortic stenosis    Arthritis    Carpal tunnel syndrome    Cervical spine degeneration    Coronary artery calcification seen on CT scan    a. 12/2011 CT: dense coronary Ca2+; b. 06/2015 MV: EF 59%, basal infsept, mid infsept, apical spet defect, likely artifact 2/2 LBBB. No ischemia.   Diastolic dysfunction    a. 05/2013 Echo: EF 45-50%, Gr1 DD, mild to mod MR, mild AI. Mean grad 4mmHg. Mild MR, mildly dil LA.   Diverticulosis    Essential hypertension    Fibromyalgia    GERD (gastroesophageal reflux disease)    IBS (irritable bowel syndrome)    IgG gammopathy    LBBB (left bundle branch block)    Orthostatic hypotension    PONV (postoperative nausea and vomiting)    Vitamin B12 deficiency     Surgical History: Past Surgical History:  Procedure Laterality Date   BREAST EXCISIONAL BIOPSY      BREAST LUMPECTOMY WITH RADIOACTIVE SEED LOCALIZATION Left 06/25/2014   Procedure: LEFT BREAST LUMPECTOMY WITH RADIOACTIVE SEED LOCALIZATION;  Surgeon: Coralie Keens, MD;  Location: [facility];  Service: General;  Laterality: Left;   CARDIAC CATHETERIZATION     CHOLECYSTECTOMY     TSA     VAGINAL HYSTERECTOMY     L ovary removed   VESICOVAGINAL FISTULA CLOSURE W/ TAH      Home Medications:  Allergies as of 10/20/2020       Reactions   Angiotensin Receptor Blockers Other (See Comments)   hyperkalemia   Aspirin Other (See Comments)   Ecchymosis with high doses   Codeine Sulfate Nausea Only   Donepezil    Other reaction(s): Other (See Comments) Could not tolerate   Erythromycin Nausea And Vomiting   Sulfa Antibiotics Nausea Only   Sulfonamide Derivatives Nausea Only        Medication List        Accurate as of October 20, 2020  3:22 PM. If you have any questions, ask your nurse or doctor.          ALIGN PO Take 1 tablet by mouth at bedtime.   amoxicillin-clavulanate 875-125 MG tablet Commonly known as: AUGMENTIN Take 1 tablet by mouth 2 (two) times daily. Started by: Hollice Espy, MD   AZO TABS PO Take by mouth.  celecoxib 200 MG capsule Commonly known as: CELEBREX Take 200 mg by mouth as needed (joint pain).   cyclobenzaprine 10 MG tablet Commonly known as: FLEXERIL Take 10 mg by mouth as needed.   esomeprazole 20 MG capsule Commonly known as: NEXIUM Take 20 mg by mouth daily.   estradiol 0.1 MG/GM vaginal cream Commonly known as: ESTRACE Apply one pea-sized amount around the opening of the urethra three times weekly.   estradiol 1 MG tablet Commonly known as: ESTRACE daily.   famotidine 20 MG tablet Commonly known as: PEPCID Take 20 mg by mouth in the morning.   gabapentin 100 MG capsule Commonly known as: NEURONTIN Take 100 mg by mouth at bedtime.   Gemtesa 75 MG Tabs Generic drug: Vibegron Take 75 mg by mouth  daily.   hydrALAZINE 25 MG tablet Commonly known as: APRESOLINE TAKE 1 TABLET BY MOUTH TWICE DAILY AT NOON AND BEDTIME   hyoscyamine 0.125 MG SL tablet Commonly known as: LEVSIN SL Take 2 tablets by mouth twice daily as needed for stomach cramps   magnesium gluconate 500 MG tablet Commonly known as: MAGONATE Take 500 mg by mouth daily.   melatonin 1 MG Tabs tablet Take by mouth. Use as needed per bottle for insomnia   methenamine 1 g tablet Commonly known as: HIPREX Take 1 tablet (1 g total) by mouth 2 (two) times daily with a meal.   metoprolol tartrate 25 MG tablet Commonly known as: LOPRESSOR TAKE 1 TABLET BY MOUTH TWICE DAILY   NON FORMULARY Ultima electrolyte powder Take 1 tsp by mouth three times a day   nystatin cream Commonly known as: MYCOSTATIN Apply topically 3 (three) times daily.   trimethoprim 100 MG tablet Commonly known as: TRIMPEX Take daily after Augmentin is completed Started by: Hollice Espy, MD   Tyrvaya 0.03 MG/ACT Soln Generic drug: Varenicline Tartrate Place 1 spray into both nostrils 2 (two) times daily.   VITAMIN D PO Take 1 tablet by mouth daily.        Allergies:  Allergies  Allergen Reactions   Angiotensin Receptor Blockers Other (See Comments)    hyperkalemia   Aspirin Other (See Comments)    Ecchymosis with high doses   Codeine Sulfate Nausea Only   Donepezil     Other reaction(s): Other (See Comments) Could not tolerate   Erythromycin Nausea And Vomiting   Sulfa Antibiotics Nausea Only   Sulfonamide Derivatives Nausea Only    Family History: Family History  Problem Relation Age of Onset   Diverticulosis Mother    Coronary artery disease Father    Heart disease Paternal Aunt    Heart disease Paternal Uncle    Breast cancer Paternal Aunt    Prostate cancer Brother    Leukemia Brother    Heart attack Brother    Hypertension Brother    Stroke Brother     Social History:  reports that she has never smoked. She  has never used smokeless tobacco. She reports that she does not drink alcohol and does not use drugs.   Physical Exam: BP (!) 142/78   Ht 5\' 3"  (1.6 m)   Wt 132 lb (59.9 kg)   BMI 23.38 kg/m   Constitutional:  Alert and oriented, No acute distress. HEENT: [place] AT, moist mucus membranes.  Trachea midline, no masses. Cardiovascular: No clubbing, cyanosis, or edema. Respiratory: Normal respiratory effort, no increased work of breathing. Skin: No rashes, bruises or suspicious lesions. Neurologic: Grossly intact, no focal deficits, moving all 4  extremities. Psychiatric: Normal mood and affect.  Laboratory Data:  Lab Results  Component Value Date   CREATININE 0.91 05/02/2019    Lab Results  Component Value Date   HGBA1C 5.8 (H) 03/26/2013   Urinalysis   >30 RBCs and WBCs, and many bacteria.   Assessment & Plan:    Acute cystitis - Placed on Augmentin x  7 days -UCx -We will defer cystoscopy today in light of acute cystitis, reschedule for in a few months while on suppression antibiotics  2. Recurrent UTI - Place on Trimethoprim suppression after course of antibiotics in light of the number of infection x 3 months to start after this acute cystitis episode has resolved -Continue Hip-Rex, vaginal estrogen, cranberry tablets and probiotics as previously recommended - RUS was unremarkable    3.  Urgency/urge incontinence Did well with Gemtesa, request prescription   Return in about 8 weeks (around 12/15/2020).  cysto  [facility] [address] [place], [place] [address] [phone]

## 2020-10-20 ENCOUNTER — Other Ambulatory Visit: Payer: Self-pay

## 2020-10-20 ENCOUNTER — Encounter: Payer: Self-pay | Admitting: Urology

## 2020-10-20 ENCOUNTER — Ambulatory Visit (INDEPENDENT_AMBULATORY_CARE_PROVIDER_SITE_OTHER): Payer: Medicare Other | Admitting: Urology

## 2020-10-20 VITALS — BP 142/78 | Ht 63.0 in | Wt 132.0 lb

## 2020-10-20 DIAGNOSIS — N3946 Mixed incontinence: Secondary | ICD-10-CM

## 2020-10-20 DIAGNOSIS — N390 Urinary tract infection, site not specified: Secondary | ICD-10-CM | POA: Diagnosis not present

## 2020-10-20 MED ORDER — TRIMETHOPRIM 100 MG PO TABS: ORAL_TABLET | ORAL | 0 refills | Status: DC

## 2020-10-20 MED ORDER — AMOXICILLIN-POT CLAVULANATE 875-125 MG PO TABS: 1.0000 | ORAL_TABLET | Freq: Two times a day (BID) | ORAL | 0 refills | Status: DC

## 2020-10-20 MED ORDER — GEMTESA 75 MG PO TABS
75.0000 mg | ORAL_TABLET | Freq: Every day | ORAL | 3 refills | Status: DC
Start: 2020-10-20 — End: 2020-12-15

## 2020-10-20 NOTE — Patient Instructions (Addendum)
Take Augmentin twice a day for one week, then once a day take Trimethoprim and continue Hyprex. Follow up cysto in 8 weeks.

## 2020-10-21 LAB — URINALYSIS, COMPLETE
Bilirubin, UA: NEGATIVE
pH, UA: 5 (ref 5.0–7.5)

## 2020-10-21 LAB — MICROSCOPIC EXAMINATION
RBC, Urine: >30 /hpf — AB (ref 0–2)
WBC, UA: >30 /hpf — AB (ref 0–5)

## 2020-12-08 ENCOUNTER — Other Ambulatory Visit: Payer: Self-pay | Admitting: Urology

## 2020-12-14 NOTE — Progress Notes (Signed)
   12/16/20  CC:  Chief Complaint  Patient presents with   Cysto     HPI: Erin Hunt is a 87 y.o.female with a personal history of recurrent UTI's, incontinence, vaginal atrophy, and pelvic floor laxity, who presents today for 8 week follow-up cystoscopy.   She has had three document positive urine cultures over the last year. On 07/27/2020 E. Coli resistant to ampicillin, 12/10/2019 E. Coli resistant to Cefuroxime, and 10/31/2019 pan-sensitive to E. Coli. Her most recent urine culture on 09/23/2020 was negative for growth.   RUS on 10/09/2020 revealed both kidneys relatively small measure less than 9 cm and no acute abnormalities identified.   Cystoscopy was previously deferred due to acute cystitis episode on 10/20/2020. She was placed on Augmentin and was placed on suppressive trimethoprim for 3 months.   She has had no issues in the interim.     Vitals:   12/15/20 1335  BP: (!) 213/80  Pulse: 77   NED. A&Ox3.   No respiratory distress   Abd soft, NT, ND Normal external genitalia with patent urethral meatus  Cystoscopy Procedure Note  Patient identification was confirmed, informed consent was obtained, and patient was prepped using Betadine solution.  Lidocaine jelly was administered per urethral meatus.    Procedure: - Flexible cystoscope introduced, without any difficulty.   - Thorough search of the bladder revealed:    normal urethral meatus    normal urothelium    no stones    no ulcers     no tumors    no urethral polyps    no trabeculation  - Ureteral orifices were normal in position and appearance.  Post-Procedure: - Patient tolerated the procedure well  Assessment/ Plan:  Recurrent UTI -Continue Hip-Rex, vaginal estrogen, cranberry tablets and probiotics as previously recommended - Cystoscopy was unremarkable   - Once her Trimethoprim course runs out consider restarting if she continues having recurrent UTI symptoms   2. Urgency/urge  incontinence  -Doing well on Gemtesa - 90 day refill before the year ends. May have coverage issues beginning in January.    Return in 1 year  Conley Rolls as a scribe for Hollice Espy, MD.,have documented all relevant documentation on the behalf of Hollice Espy, MD,as directed by  Hollice Espy, MD while in the presence of Hollice Espy, MD.  I have reviewed the above documentation for accuracy and completeness, and I agree with the above.   Hollice Espy, MD

## 2020-12-15 ENCOUNTER — Encounter: Payer: Self-pay | Admitting: Urology

## 2020-12-15 ENCOUNTER — Other Ambulatory Visit: Payer: Federal, State, Local not specified - PPO | Admitting: Urology

## 2020-12-15 ENCOUNTER — Ambulatory Visit (INDEPENDENT_AMBULATORY_CARE_PROVIDER_SITE_OTHER): Payer: Medicare Other | Admitting: Urology

## 2020-12-15 ENCOUNTER — Other Ambulatory Visit: Payer: Self-pay

## 2020-12-15 VITALS — BP 213/80 | HR 77 | Ht 63.0 in | Wt 132.0 lb

## 2020-12-15 DIAGNOSIS — N390 Urinary tract infection, site not specified: Secondary | ICD-10-CM | POA: Diagnosis not present

## 2020-12-15 DIAGNOSIS — N3946 Mixed incontinence: Secondary | ICD-10-CM

## 2020-12-15 MED ORDER — GEMTESA 75 MG PO TABS
75.0000 mg | ORAL_TABLET | Freq: Every day | ORAL | 3 refills | Status: DC
Start: 2020-12-15 — End: 2021-01-26

## 2020-12-16 LAB — MICROSCOPIC EXAMINATION

## 2020-12-16 LAB — URINALYSIS, COMPLETE
Bilirubin, UA: NEGATIVE
Glucose, UA: NEGATIVE
Ketones, UA: NEGATIVE
Leukocytes,UA: NEGATIVE
Nitrite, UA: NEGATIVE
Protein,UA: NEGATIVE
RBC, UA: NEGATIVE
Specific Gravity, UA: 1.015 (ref 1.005–1.030)
Urobilinogen, Ur: 0.2 mg/dL (ref 0.2–1.0)
pH, UA: 6 (ref 5.0–7.5)

## 2021-01-01 NOTE — Telephone Encounter (Signed)
Pt switched to [facility] in [place] and need a 3-mth supply of the vaginal cream. Pts daughter [name] would like a call back when called in. Her contact number is [phone].

## 2021-01-11 ENCOUNTER — Other Ambulatory Visit: Payer: Self-pay | Admitting: Urology

## 2021-01-19 ENCOUNTER — Telehealth: Payer: Self-pay | Admitting: Urology

## 2021-01-19 ENCOUNTER — Other Ambulatory Visit: Payer: Self-pay | Admitting: Physician Assistant

## 2021-01-19 DIAGNOSIS — Z87440 Personal history of urinary (tract) infections: Secondary | ICD-10-CM

## 2021-01-19 MED ORDER — ESTRADIOL 0.1 MG/GM VA CREA: TOPICAL_CREAM | VAGINAL | 5 refills | Status: DC

## 2021-01-19 NOTE — Telephone Encounter (Signed)
Daughter notified and voiced understanding

## 2021-01-19 NOTE — Telephone Encounter (Signed)
Pts daughter called in to say her mother Erin Hunt) needs a prescription sent in to [facility] in [place] for the Estradiol (Estrace). She would like a call back if possible when done. She said ok to leave message on her vm if she does not answer.

## 2021-01-26 ENCOUNTER — Telehealth: Payer: Self-pay | Admitting: Urology

## 2021-01-26 DIAGNOSIS — N3946 Mixed incontinence: Secondary | ICD-10-CM

## 2021-01-26 MED ORDER — GEMTESA 75 MG PO TABS
75.0000 mg | ORAL_TABLET | Freq: Every day | ORAL | 3 refills | Status: DC
Start: 2021-01-26 — End: 2021-11-12

## 2021-01-26 NOTE — Telephone Encounter (Signed)
Spoke with patient's daughter, [name]. Patient has one month left, requested refill sent to [facility]. RX sent, voiced understanding.

## 2021-01-26 NOTE — Telephone Encounter (Signed)
Pts daughter [name] called in and said Ms. Tickles insurance will not cover the [place] and she was told by someone (not sure who) that they would see if they could help them get the Gemtesa at a discounted rate. [name] would like a call back in regards to this today if possible.

## 2021-02-01 NOTE — Telephone Encounter (Signed)
Netta Bookwalter (Key: [id number]) - [id number] Gemtesa 75MG  tablets     Status: Sent to Plan  Created: January 6th, 2023 [id number]

## 2021-02-04 NOTE — Telephone Encounter (Addendum)
Received PA Auth approval for [facility] 02/01/21-02/01/23-patient notified

## 2021-02-22 ENCOUNTER — Encounter: Payer: Self-pay | Admitting: Internal Medicine

## 2021-02-22 ENCOUNTER — Other Ambulatory Visit: Payer: Self-pay | Admitting: Nurse Practitioner

## 2021-03-29 ENCOUNTER — Other Ambulatory Visit: Payer: Self-pay | Admitting: Internal Medicine

## 2021-03-30 ENCOUNTER — Ambulatory Visit: Payer: Medicare Other | Admitting: Urology

## 2021-05-13 ENCOUNTER — Encounter: Payer: Self-pay | Admitting: Internal Medicine

## 2021-05-13 ENCOUNTER — Ambulatory Visit (INDEPENDENT_AMBULATORY_CARE_PROVIDER_SITE_OTHER): Payer: Medicare Other | Admitting: Internal Medicine

## 2021-05-13 VITALS — BP 183/66 | HR 64 | Ht 63.0 in | Wt 128.0 lb

## 2021-05-13 DIAGNOSIS — I429 Cardiomyopathy, unspecified: Secondary | ICD-10-CM

## 2021-05-13 DIAGNOSIS — I447 Left bundle-branch block, unspecified: Secondary | ICD-10-CM

## 2021-05-13 DIAGNOSIS — I951 Orthostatic hypotension: Secondary | ICD-10-CM | POA: Diagnosis not present

## 2021-05-13 DIAGNOSIS — I10 Essential (primary) hypertension: Secondary | ICD-10-CM | POA: Diagnosis not present

## 2021-05-13 DIAGNOSIS — I350 Nonrheumatic aortic (valve) stenosis: Secondary | ICD-10-CM

## 2021-05-13 DIAGNOSIS — I428 Other cardiomyopathies: Secondary | ICD-10-CM

## 2021-05-13 NOTE — Progress Notes (Signed)
? ? ? ? ?Patient Care Team: ?Perrin Maltese, MD as PCP - General (Internal Medicine) ?Deboraha Sprang, MD as PCP - Cardiology (Cardiology) ? ? ?HPI ? ?Erin Hunt is a 88 y.o. female ?Seen in followup of orthostatic hypotension and systolic hypertension.    ?  ?Continues with systolic hypertension but at home blood pressures have been in the 140 range.  Lightheadedness with prolonged standing. ? ?Edema resolved with the discontinuation of the amlodipine. ? ?No dyspnea, chest pain, nocturnal dyspnea palpitations or syncope  ? ?Recurrent UTIs, feels like she has not recovered her strength well since then.  Walks with more gait instability.  d ?Date Cr K Hgb  ?12/16 1.03 5.5 13.8  ?5/17 0.86 4.9   ?4/21 0.91 5.0   ? ? ?DATE TEST EF   ?3/15 echo   normal   mild AS  ?6/17 Echo  45-50% Mild AS (Mean G 10)  ? 6/17 myoview  normal No ischemia  ?1/20 you know what we should also repeat her echo Echo  55-65% AS mild ( mean G 11)  ? ?  ?  ? ?Past Medical History:  ?Diagnosis Date  ? Allergic rhinitis   ? Anemia   ? Aortic stenosis   ? Arthritis   ? Carpal tunnel syndrome   ? Cervical spine degeneration   ? Coronary artery calcification seen on CT scan   ? a. 12/2011 CT: dense coronary Ca2+; b. 06/2015 MV: EF 59%, basal infsept, mid infsept, apical spet defect, likely artifact 2/2 LBBB. No ischemia.  ? Diastolic dysfunction   ? a. 05/2013 Echo: EF 45-50%, Gr1 DD, mild to mod MR, mild AI. Mean grad 31mHg. Mild MR, mildly dil LA.  ? Diverticulosis   ? Essential hypertension   ? Fibromyalgia   ? GERD (gastroesophageal reflux disease)   ? IBS (irritable bowel syndrome)   ? IgG gammopathy   ? LBBB (left bundle branch block)   ? Orthostatic hypotension   ? PONV (postoperative nausea and vomiting)   ? Vitamin B12 deficiency   ? ? ?Past Surgical History:  ?Procedure Laterality Date  ? BREAST EXCISIONAL BIOPSY    ? BREAST LUMPECTOMY WITH RADIOACTIVE SEED LOCALIZATION Left 06/25/2014  ? Procedure: LEFT BREAST LUMPECTOMY WITH RADIOACTIVE  SEED LOCALIZATION;  Surgeon: DCoralie Keens MD;  Location: [facility]  Service: General;  Laterality: Left;  ? CARDIAC CATHETERIZATION    ? CHOLECYSTECTOMY    ? TSA    ? VAGINAL HYSTERECTOMY    ? L ovary removed  ? VESICOVAGINAL FISTULA CLOSURE W/ TAH    ? ? ?Current Outpatient Medications  ?Medication Sig Dispense Refill  ? buPROPion (WELLBUTRIN XL) 150 MG 24 hr tablet Take 150 mg by mouth every morning.    ? celecoxib (CELEBREX) 200 MG capsule Take 200 mg by mouth as needed (joint pain).     ? Cholecalciferol (VITAMIN D PO) Take 1 tablet by mouth daily.    ? cyclobenzaprine (FLEXERIL) 10 MG tablet Take 10 mg by mouth as needed.     ? cycloSPORINE (RESTASIS) 0.05 % ophthalmic emulsion 1 drop 2 (two) times daily.    ? estradiol (ESTRACE) 0.1 MG/GM vaginal cream Apply one pea-sized amount around the opening of the urethra three times weekly. 42.5 g 5  ? estradiol (ESTRACE) 1 MG tablet Take 1 mg by mouth daily.    ? gabapentin (NEURONTIN) 100 MG capsule Take 100 mg by mouth at bedtime.    ? hydrALAZINE (APRESOLINE)  25 MG tablet TAKE 1 TABLET BY MOUTH TWICE DAILY AT NOON AND AT BEDTIME 180 tablet 0  ? hyoscyamine (LEVSIN SL) 0.125 MG SL tablet Take 2 tablets by mouth twice daily as needed for stomach cramps    ? magnesium gluconate (MAGONATE) 500 MG tablet Take 500 mg by mouth daily.    ? methenamine (HIPREX) 1 g tablet TAKE 1 TABLET BY MOUTH TWICE DAILY WITH A MEAL 180 tablet 1  ? metoprolol tartrate (LOPRESSOR) 25 MG tablet TAKE 1 TABLET BY MOUTH TWICE DAILY 180 tablet 3  ? nystatin cream (MYCOSTATIN) Apply topically 3 (three) times daily.    ? Probiotic Product (ALIGN PO) Take 1 tablet by mouth at bedtime.    ? Vibegron (GEMTESA) 75 MG TABS Take 75 mg by mouth daily. 90 tablet 3  ? esomeprazole (NEXIUM) 20 MG capsule Take 20 mg by mouth daily.    ? famotidine (PEPCID) 20 MG tablet Take 20 mg by mouth in the morning. (Patient not taking: Reported on 05/13/2021)    ? Melatonin 1 MG TABS Take by  mouth. Use as needed per bottle for insomnia (Patient not taking: Reported on 05/13/2021)    ? trimethoprim (TRIMPEX) 100 MG tablet Take daily after Augmentin is completed 90 tablet 0  ? TYRVAYA 0.03 MG/ACT SOLN Place 1 spray into both nostrils 2 (two) times daily.    ? ?No current facility-administered medications for this visit.  ? ? ?Allergies  ?Allergen Reactions  ? Angiotensin Receptor Blockers Other (See Comments)  ?  hyperkalemia  ? Aspirin Other (See Comments)  ?  Ecchymosis with high doses  ? Codeine Sulfate Nausea Only  ? Donepezil   ?  Other reaction(s): Other (See Comments) ?Could not tolerate  ? Erythromycin Nausea And Vomiting  ? Sulfa Antibiotics Nausea Only  ? Sulfonamide Derivatives Nausea Only  ? ? ? ? ?Review of Systems negative except from HPI and PMH ? ?Physical Exam ?BP (!) 183/66 (BP Location: Right Arm, Patient Position: Sitting, Cuff Size: Normal)   Pulse 64   Ht '5\' 3"'$  (1.6 m)   Wt 128 lb (58.1 kg)   SpO2 96%   BMI 22.67 kg/m?  ?Well developed and well nourished in no acute distress ?HENT normal ?Neck supple  ?Clear ?Device pocket well healed; without hematoma or erythema.  There is no tethering  ?Regular rate and rhythm, no gallop 2-3/6 murmur S2 preserved ?Abd-soft with active BS ?No Clubbing cyanosis  edema ?Skin-warm and dry ?A & Oriented  Grossly normal sensory and motor function ? ?ECG sinus at 64 ?Interval 15/14/43 ?Left bundle branch block ? ? ?Assessment and  Plan ? ?Hypertension ? ?Orthostatic hypotension ? ?Aortic stenosis-mild ? ?LBBB  ? ?Cardiomyopathy--mild ?  ?Blood pressure significantly elevated here but according to her daughter it is nearly normal at home.  We have asked him to check blood pressures at home supine and then with prolonged standing.  Her wobbliness when she walks may be related to orthostatic intolerance, here her blood pressure fell about 30 points and if it truly 140 at home it may be that following the 110 are her symptoms.  In that case,  pyridostigmine and/or compression might be appropriate to try to treat her systolic hypertension and avoid orthostatic hypotension. ? ?We will repeat her echocardiogram to reassess aortic stenosis and LV function. ? ?Last hemoglobin we have in our records is 11/16; her daughter says that was recently checked at [facility], and they will work on getting this information ? ? ? ?

## 2021-05-13 NOTE — Patient Instructions (Addendum)
Medication Instructions:  ?- Your physician recommends that you continue on your current medications as directed. Please refer to the Current Medication list given to you today. ? ?*If you need a refill on your cardiac medications before your next appointment, please call your pharmacy* ? ? ?Lab Work: ?- Please try to obtain a Complete Blood Count (CBC) panel from your primary care office ? ? ?If you have labs (blood work) drawn today and your tests are completely normal, you will receive your results only by: ?MyChart Message (if you have MyChart) OR ?A paper copy in the mail ?If you have any lab test that is abnormal or we need to change your treatment, we will call you to review the results. ? ? ?Testing/Procedures: ?- Your physician has requested that you have an echocardiogram. Echocardiography is a painless test that uses sound waves to create images of your heart. It provides your doctor with information about the size and shape of your heart and how well your heart?s chambers and valves are working. This procedure takes approximately one hour. There are no restrictions for this procedure. There is a possibility that an IV may need to be started during your test to inject an image enhancing agent. This is done to obtain more optimal pictures of your heart. Therefore we ask that you do at least drink some water prior to coming in to hydrate your veins.  ? ? ? ?Follow-Up: ?At [facility], you and your health needs are our priority.  As part of our continuing mission to provide you with exceptional heart care, we have created designated Provider Care Teams.  These Care Teams include your primary Cardiologist (physician) and Advanced Practice Providers (APPs -  Physician Assistants and Nurse Practitioners) who all work together to provide you with the care you need, when you need it. ? ?We recommend signing up for the patient portal called "MyChart".  Sign up information is provided on this After Visit  Summary.  MyChart is used to connect with patients for Virtual Visits (Telemedicine).  Patients are able to view lab/test results, encounter notes, upcoming appointments, etc.  Non-urgent messages can be sent to your provider as well.   ?To learn more about what you can do with MyChart, go to [email or web address].   ? ?Your next appointment:   ?1 year(s) ? ?The format for your next appointment:   ?In Person ? ?Provider:   ?Virl Axe, MD  ? ? ?Other Instructions ? ?1) Please monitor blood pressures at home ? ?2) Try to obtain orthostatic blood pressure/ heart rate readings ?- lay down flat or as flat as possible x 10 minutes ?- after 10 minutes try to obtain blood pressure/ heart rate readings as below ? ?Lying ?Sittting (take almost immediately after sitting up) ?Standing (take almost immediately after standing up) ?Standing for 3 minutes ? ? ?Echocardiogram ?An echocardiogram is a test that uses sound waves (ultrasound) to produce images of the heart. ?Images from an echocardiogram can provide important information about: ?Heart size and shape. ?The size and thickness and movement of your heart's walls. ?Heart muscle function and strength. ?Heart valve function or if you have stenosis. Stenosis is when the heart valves are too narrow. ?If blood is flowing backward through the heart valves (regurgitation). ?A tumor or infectious growth around the heart valves. ?Areas of heart muscle that are not working well because of poor blood flow or injury from a heart attack. ?Aneurysm detection. An aneurysm is a weak or damaged part  of an artery wall. The wall bulges out from the normal force of blood pumping through the body. ?Tell a health care provider about: ?Any allergies you have. ?All medicines you are taking, including vitamins, herbs, eye drops, creams, and over-the-counter medicines. ?Any blood disorders you have. ?Any surgeries you have had. ?Any medical conditions you have. ?Whether you are pregnant or may  be pregnant. ?What are the risks? ?Generally, this is a safe test. However, problems may occur, including an allergic reaction to dye (contrast) that may be used during the test. ?What happens before the test? ?No specific preparation is needed. You may eat and drink normally. ?What happens during the test? ? ?You will take off your clothes from the waist up and put on a hospital gown. ?Electrodes or electrocardiogram (ECG)patches may be placed on your chest. The electrodes or patches are then connected to a device that monitors your heart rate and rhythm. ?You will lie down on a table for an ultrasound exam. A gel will be applied to your chest to help sound waves pass through your skin. ?A handheld device, called a transducer, will be pressed against your chest and moved over your heart. The transducer produces sound waves that travel to your heart and bounce back (or "echo" back) to the transducer. These sound waves will be captured in real-time and changed into images of your heart that can be viewed on a video monitor. The images will be recorded on a computer and reviewed by your health care provider. ?You may be asked to change positions or hold your breath for a short time. This makes it easier to get different views or better views of your heart. ?In some cases, you may receive contrast through an IV in one of your veins. This can improve the quality of the pictures from your heart. ?The procedure may vary among health care providers and hospitals. ?What can I expect after the test? ?You may return to your normal, everyday life, including diet, activities, and medicines, unless your health care provider tells you not to do that. ?Follow these instructions at home: ?It is up to you to get the results of your test. Ask your health care provider, or the department that is doing the test, when your results will be ready. ?Keep all follow-up visits. This is important. ?Summary ?An echocardiogram is a test that  uses sound waves (ultrasound) to produce images of the heart. ?Images from an echocardiogram can provide important information about the size and shape of your heart, heart muscle function, heart valve function, and other possible heart problems. ?You do not need to do anything to prepare before this test. You may eat and drink normally. ?After the echocardiogram is completed, you may return to your normal, everyday life, unless your health care provider tells you not to do that. ?This information is not intended to replace advice given to you by your health care provider. Make sure you discuss any questions you have with your health care provider. ?Document Revised: 09/23/2020 Document Reviewed: 09/03/2019 ?Elsevier Patient Education ? [place]. ? ? ?Important Information About Sugar ? ? ? ? ? ? ?

## 2021-05-16 ENCOUNTER — Encounter: Payer: Self-pay | Admitting: Internal Medicine

## 2021-05-28 ENCOUNTER — Ambulatory Visit (INDEPENDENT_AMBULATORY_CARE_PROVIDER_SITE_OTHER): Payer: Medicare Other

## 2021-05-28 DIAGNOSIS — I428 Other cardiomyopathies: Secondary | ICD-10-CM

## 2021-05-28 LAB — ECHOCARDIOGRAM COMPLETE
AR max vel: 0.8 cm2
AV Area VTI: 0.74 cm2
AV Area mean vel: 0.67 cm2
AV Mean grad: 18 mmHg
AV Peak grad: 23.7 mmHg
Ao pk vel: 2.43 m/s
Area-P 1/2: 4.08 cm2
P 1/2 time: 656 msec
S' Lateral: 3 cm
Single Plane A4C EF: 71.7 %

## 2021-05-28 MED ORDER — PERFLUTREN LIPID MICROSPHERE
1.0000 mL | INTRAVENOUS | Status: AC | PRN
  Administered 2021-05-28: 2 mL via INTRAVENOUS

## 2021-06-28 ENCOUNTER — Other Ambulatory Visit: Payer: Self-pay | Admitting: Internal Medicine

## 2021-07-08 ENCOUNTER — Other Ambulatory Visit: Payer: Self-pay | Admitting: Urology

## 2021-07-28 ENCOUNTER — Other Ambulatory Visit: Payer: Self-pay | Admitting: Internal Medicine

## 2021-10-25 ENCOUNTER — Other Ambulatory Visit: Payer: Self-pay | Admitting: Nurse Practitioner

## 2021-10-25 DIAGNOSIS — Z1231 Encounter for screening mammogram for malignant neoplasm of breast: Secondary | ICD-10-CM

## 2021-11-11 ENCOUNTER — Telehealth: Payer: Self-pay | Admitting: Urology

## 2021-11-11 DIAGNOSIS — N3946 Mixed incontinence: Secondary | ICD-10-CM

## 2021-11-11 NOTE — Telephone Encounter (Signed)
Pharmacy not found. Called pt's daughter for more information, unsuccessfully.

## 2021-11-11 NOTE — Telephone Encounter (Signed)
PT's daughter called to let us know she needs a refill of Gemtesa sent to [facility].  (New pharmacy).  Pt has 1 yr follow up appt on 11/22.

## 2021-11-11 NOTE — Telephone Encounter (Signed)
Patient's daughter Kerin Perna) called back regarding Gemtesa refill. She said it has switched to another pharmacy, and she can email the link if needed. Please call her at [phone]

## 2021-11-12 MED ORDER — GEMTESA 75 MG PO TABS: 75.0000 mg | ORAL_TABLET | Freq: Every day | ORAL | 3 refills | Status: DC

## 2021-11-12 NOTE — Telephone Encounter (Addendum)
Spoke with daughter and she stated pt is now using [facility] for her gemtesa prescription, refill sent and pharmacy added.

## 2021-11-23 ENCOUNTER — Ambulatory Visit
Admission: RE | Admit: 2021-11-23 | Discharge: 2021-11-23 | Disposition: A | Discharge status: Home / Self Care | Payer: Medicare Other | Source: Ambulatory Visit | Attending: Nurse Practitioner | Admitting: Nurse Practitioner

## 2021-11-23 DIAGNOSIS — Z1231 Encounter for screening mammogram for malignant neoplasm of breast: Secondary | ICD-10-CM | POA: Diagnosis present

## 2021-12-15 ENCOUNTER — Ambulatory Visit: Payer: Federal, State, Local not specified - PPO | Admitting: Urology

## 2021-12-20 ENCOUNTER — Other Ambulatory Visit: Payer: Self-pay | Admitting: Internal Medicine

## 2021-12-28 NOTE — Progress Notes (Unsigned)
12/29/2021 8:59 PM   Erin Hunt 24-Dec-1928 [id number]  Referring provider: Perrin Maltese, MD [address] [place],  [place] [address]  Urological history: 1. rUTI's -contributing factors of age, vaginal atrophy, IBS and incontinence -cysto (11/2021) - NED -documented urine cultures over the last year  10/18/2021 MUF -Hiprex 1 gram BID and vaginal estrogen cream    2. Incontinence -contributing factors of age, vaginal atrophy, pelvic floor laxity and muscle relaxers -PVR 30 cc -Myrbetriq 50 mg daily   3. Pelvic floor laxity -has seen PT     No chief complaint on file.   HPI: Erin Hunt is a 88 y.o. female who presents today for one year follow up.    UA ***  PVR ***  PMH: Past Medical History:  Diagnosis Date   Allergic rhinitis    Anemia    Aortic stenosis    Arthritis    Carpal tunnel syndrome    Cervical spine degeneration    Coronary artery calcification seen on CT scan    a. 12/2011 CT: dense coronary Ca2+; b. 06/2015 MV: EF 59%, basal infsept, mid infsept, apical spet defect, likely artifact 2/2 LBBB. No ischemia.   Diastolic dysfunction    a. 05/2013 Echo: EF 45-50%, Gr1 DD, mild to mod MR, mild AI. Mean grad 48mHg. Mild MR, mildly dil LA.   Diverticulosis    Essential hypertension    Fibromyalgia    GERD (gastroesophageal reflux disease)    IBS (irritable bowel syndrome)    IgG gammopathy    LBBB (left bundle branch block)    Orthostatic hypotension    PONV (postoperative nausea and vomiting)    Vitamin B12 deficiency     Surgical History: Past Surgical History:  Procedure Laterality Date   BREAST EXCISIONAL BIOPSY     BREAST LUMPECTOMY WITH RADIOACTIVE SEED LOCALIZATION Left 06/25/2014   Procedure: LEFT BREAST LUMPECTOMY WITH RADIOACTIVE SEED LOCALIZATION;  Surgeon: DCoralie Keens MD;  Location: [facility]  Service: General;  Laterality: Left;   CARDIAC CATHETERIZATION     CHOLECYSTECTOMY     TSA     VAGINAL  HYSTERECTOMY     L ovary removed   VESICOVAGINAL FISTULA CLOSURE W/ TAH      Home Medications:  Allergies as of 12/29/2021       Reactions   Angiotensin Receptor Blockers Other (See Comments)   hyperkalemia   Aspirin Other (See Comments)   Ecchymosis with high doses   Codeine Sulfate Nausea Only   Donepezil    Other reaction(s): Other (See Comments) Could not tolerate   Erythromycin Nausea And Vomiting   Sulfa Antibiotics Nausea Only   Sulfonamide Derivatives Nausea Only        Medication List        Accurate as of December 28, 2021  8:59 PM. If you have any questions, ask your nurse or doctor.          ALIGN PO Take 1 tablet by mouth at bedtime.   buPROPion 150 MG 24 hr tablet Commonly known as: WELLBUTRIN XL Take 150 mg by mouth every morning.   celecoxib 200 MG capsule Commonly known as: CELEBREX Take 200 mg by mouth as needed (joint pain).   cyclobenzaprine 10 MG tablet Commonly known as: FLEXERIL Take 10 mg by mouth as needed.   cycloSPORINE 0.05 % ophthalmic emulsion Commonly known as: RESTASIS 1 drop 2 (two) times daily.   esomeprazole 20 MG capsule Commonly known as: NEXIUM Take  20 mg by mouth daily.   estradiol 0.1 MG/GM vaginal cream Commonly known as: ESTRACE Apply one pea-sized amount around the opening of the urethra three times weekly.   estradiol 1 MG tablet Commonly known as: ESTRACE Take 1 mg by mouth daily.   gabapentin 100 MG capsule Commonly known as: NEURONTIN Take 100 mg by mouth at bedtime.   Gemtesa 75 MG Tabs Generic drug: Vibegron Take 75 mg by mouth daily.   hydrALAZINE 25 MG tablet Commonly known as: APRESOLINE TAKE 1 TABLET BY MOUTH TWICE DAILY AT NOON AND AT BEDTIME   hyoscyamine 0.125 MG SL tablet Commonly known as: LEVSIN SL Take 2 tablets by mouth twice daily as needed for stomach cramps   magnesium gluconate 500 MG tablet Commonly known as: MAGONATE Take 500 mg by mouth daily.   melatonin 1 MG Tabs  tablet Take by mouth. Use as needed per bottle for insomnia   methenamine 1 g tablet Commonly known as: HIPREX TAKE 1 TABLET BY MOUTH TWICE DAILY WITH A MEAL   metoprolol tartrate 25 MG tablet Commonly known as: LOPRESSOR TAKE 1 TABLET BY MOUTH TWICE DAILY   nystatin cream Commonly known as: MYCOSTATIN Apply topically 3 (three) times daily.   VITAMIN D PO Take 1 tablet by mouth daily.        Allergies:  Allergies  Allergen Reactions   Angiotensin Receptor Blockers Other (See Comments)    hyperkalemia   Aspirin Other (See Comments)    Ecchymosis with high doses   Codeine Sulfate Nausea Only   Donepezil     Other reaction(s): Other (See Comments) Could not tolerate   Erythromycin Nausea And Vomiting   Sulfa Antibiotics Nausea Only   Sulfonamide Derivatives Nausea Only    Family History: Family History  Problem Relation Age of Onset   Diverticulosis Mother    Coronary artery disease Father    Heart disease Paternal Aunt    Heart disease Paternal Uncle    Breast cancer Paternal Aunt    Prostate cancer Brother    Leukemia Brother    Heart attack Brother    Hypertension Brother    Stroke Brother     Social History:  reports that she has never smoked. She has never used smokeless tobacco. She reports that she does not drink alcohol and does not use drugs.  ROS: Pertinent ROS in HPI  Physical Exam: There were no vitals taken for this visit.  Constitutional:  Well nourished. Alert and oriented, No acute distress. HEENT: [place] AT, moist mucus membranes.  Trachea midline, no masses. Cardiovascular: No clubbing, cyanosis, or edema. Respiratory: Normal respiratory effort, no increased work of breathing. Neurologic: Grossly intact, no focal deficits, moving all 4 extremities. Psychiatric: Normal mood and affect.    Laboratory Data: See EPIC and HPI I have reviewed the labs.   Pertinent Imaging: N/A  Assessment & Plan:    1. rUTI's -UA *** -urine culture  ***  2. Mixed incontinence - continue Myrbetriq 50 mg daily  No follow-ups on file.  These notes generated with voice recognition software. I apologize for typographical errors.  [place], [place] [address]  [place], [place] [address] [phone]

## 2021-12-29 ENCOUNTER — Ambulatory Visit (INDEPENDENT_AMBULATORY_CARE_PROVIDER_SITE_OTHER): Payer: Medicare Other | Admitting: Urology

## 2021-12-29 ENCOUNTER — Encounter: Payer: Self-pay | Admitting: Urology

## 2021-12-29 VITALS — BP 198/73 | HR 65 | Ht 63.0 in | Wt 138.0 lb

## 2021-12-29 DIAGNOSIS — N3946 Mixed incontinence: Secondary | ICD-10-CM | POA: Diagnosis not present

## 2021-12-29 DIAGNOSIS — N390 Urinary tract infection, site not specified: Secondary | ICD-10-CM | POA: Diagnosis not present

## 2021-12-29 LAB — BLADDER SCAN AMB NON-IMAGING

## 2021-12-29 NOTE — Progress Notes (Signed)
In and Out Catheterization  Patient is present today for a I & O catheterization due to rUTI. Patient was cleaned and prepped in a sterile fashion with betadine . A 14FR cath was inserted no complications were noted , 25m of urine return was noted, urine was light yellow in color. A clean urine sample was collected for UA & UCX. Bladder was drained  And catheter was removed with out difficulty.    Performed by: CBradly BienenstockCMA

## 2021-12-30 LAB — URINALYSIS, COMPLETE
Bilirubin, UA: NEGATIVE
Glucose, UA: NEGATIVE
Ketones, UA: NEGATIVE
Leukocytes,UA: NEGATIVE
Nitrite, UA: NEGATIVE
Protein,UA: NEGATIVE
RBC, UA: NEGATIVE
Specific Gravity, UA: 0.01 — CL (ref 1.005–1.030)
Urobilinogen, Ur: 0.2 mg/dL (ref 0.2–1.0)
pH, UA: 5.5 (ref 5.0–7.5)

## 2021-12-30 LAB — MICROSCOPIC EXAMINATION
Bacteria, UA: NONE SEEN
RBC, Urine: NONE SEEN /hpf (ref 0–2)

## 2021-12-31 ENCOUNTER — Telehealth: Payer: Self-pay

## 2021-12-31 MED ORDER — NITROFURANTOIN MONOHYD MACRO 100 MG PO CAPS: 100.0000 mg | ORAL_CAPSULE | Freq: Two times a day (BID) | ORAL | 0 refills | Status: AC

## 2021-12-31 NOTE — Telephone Encounter (Signed)
Pt's daughter [name] calls triage line x 2. She states that the patient is having dysuria this morning and noted blood on the tissue when wiping. The daughter states that she is concerned the patient needs treatment with antibiotics while we await culture results. Please advise.

## 2021-12-31 NOTE — Telephone Encounter (Signed)
Spoke w [name], advised her of Abx Rx. Rx sent to pharmacy. Daughter expressed understanding.

## 2022-01-01 LAB — CULTURE, URINE COMPREHENSIVE

## 2022-01-03 ENCOUNTER — Telehealth: Payer: Self-pay

## 2022-01-03 NOTE — Telephone Encounter (Signed)
Ok per [employer], spoke w/ [name], advised her of results. She would like for pt to have a pelvic exam in our office, appt booked, pt confirmed.

## 2022-01-03 NOTE — Telephone Encounter (Signed)
-----   Message from Nori Riis, PA-C sent at 01/02/2022  7:31 PM EST ----- Please let Mrs. Moosman's daughter, [name], know that mother's urine was negative for infection.  She can stop the antibiotic.  She either needs to see [place] or her PCP for a pelvic exam to see if the blood is coming from the vaginal area or from the bladder.

## 2022-01-10 ENCOUNTER — Other Ambulatory Visit: Payer: Self-pay | Admitting: Urology

## 2022-01-25 NOTE — Progress Notes (Signed)
01/26/2022 12:56 PM   Erin Hunt 01/08/1929 [id number]  Referring provider: Perrin Maltese, MD [address] [place],  [place] [address]  Urological history: 1. rUTI's -contributing factors of age, vaginal atrophy, IBS and incontinence -cysto (11/2021) - NED -documented urine cultures over the last year  10/18/2021 MUF -Hiprex 1 gram BID and vaginal estrogen cream    2. Incontinence -contributing factors of age, vaginal atrophy, pelvic floor laxity and muscle relaxers -PVR 30 cc -Gemtesa 75 mg daily   3. Pelvic floor laxity -has seen PT     Chief Complaint  Patient presents with   Follow-up    Pelvic exam    HPI: Erin Hunt is a 89 y.o. female who presents today for further evaluation of her rUTI's and gross heme with her daughter, [name].   At her visit on 12/29/2021, she has been having 1-7 daytime urinations, occasional 1-2 nocturia with a severe urgency to urinate in the a.m.  She does have urge incontinence.  She has urinary leakage 1-2 times daily.  She has 3+ pantiliners daily.  She does not toilet map and she does not restrict her fluid.  Her daughter states that she has had 3 urinary tract infections since September treated by her primary care physician in an urgent care clinic.  I do not have those records available to me today.  The daughter states that the urine cultures were positive for E. coli and they treated her with Macrobid.  Erin Hunt states that with the urinary tract infections in September and October it was associated with gross hematuria.  All 3 infections had symptoms of malaise, a tearing sensation in the suprapubic area, lower back pain and burning with urination.  Erin Hunt states she still feels miserable and continues to have lower back pain with an increase of her urinary urgency, frequency and urge incontinence over the last 2 weeks.  UA yellow clear, specific gravity 1.010, trace blood, pH 5.0, 6-10 WBCs, greater than 10 epithelial  cells, greater than 10 renal epithelial cells and many bacteria.  CATH UA yellow clear, specific gravity 1.010, pH 5.5, WBC 0-5, 0-10 epithelial cells and 0-10 renal epithelial cells.  PVR 0 mL.  Urine culture was negative.   I have not yet received any documented urine cultures from her PCP.    She states she is still feeling poorly.  She is experiencing dizzy spells and LBP.  Patient denies any modifying or aggravating factors.  Patient denies any gross hematuria, dysuria or suprapubic/flank pain.  Patient denies any fevers, chills, nausea or vomiting.    PMH: Past Medical History:  Diagnosis Date   Allergic rhinitis    Anemia    Aortic stenosis    Arthritis    Carpal tunnel syndrome    Cervical spine degeneration    Coronary artery calcification seen on CT scan    a. 12/2011 CT: dense coronary Ca2+; b. 06/2015 MV: EF 59%, basal infsept, mid infsept, apical spet defect, likely artifact 2/2 LBBB. No ischemia.   Diastolic dysfunction    a. 05/2013 Echo: EF 45-50%, Gr1 DD, mild to mod MR, mild AI. Mean grad 64mHg. Mild MR, mildly dil LA.   Diverticulosis    Essential hypertension    Fibromyalgia    GERD (gastroesophageal reflux disease)    IBS (irritable bowel syndrome)    IgG gammopathy    LBBB (left bundle branch block)    Orthostatic hypotension    PONV (postoperative nausea and vomiting)  Vitamin B12 deficiency     Surgical History: Past Surgical History:  Procedure Laterality Date   BREAST EXCISIONAL BIOPSY     BREAST LUMPECTOMY WITH RADIOACTIVE SEED LOCALIZATION Left 06/25/2014   Procedure: LEFT BREAST LUMPECTOMY WITH RADIOACTIVE SEED LOCALIZATION;  Surgeon: Coralie Keens, MD;  Location: [facility];  Service: General;  Laterality: Left;   CARDIAC CATHETERIZATION     CHOLECYSTECTOMY     TSA     VAGINAL HYSTERECTOMY     L ovary removed   VESICOVAGINAL FISTULA CLOSURE W/ TAH      Home Medications:  Allergies as of 01/26/2022       Reactions    Angiotensin Receptor Blockers Other (See Comments)   hyperkalemia   Aspirin Other (See Comments)   Ecchymosis with high doses   Codeine Sulfate Nausea Only   Donepezil    Other reaction(s): Other (See Comments) Could not tolerate   Erythromycin Nausea And Vomiting   Sulfa Antibiotics Nausea Only   Sulfonamide Derivatives Nausea Only        Medication List        Accurate as of January 26, 2022 11:59 PM. If you have any questions, ask your nurse or doctor.          ALIGN PO Take 1 tablet by mouth at bedtime.   buPROPion 150 MG 24 hr tablet Commonly known as: WELLBUTRIN XL Take 150 mg by mouth every morning.   celecoxib 200 MG capsule Commonly known as: CELEBREX Take 200 mg by mouth as needed (joint pain).   cephALEXin 250 MG capsule Commonly known as: Keflex Take 1 capsule (250 mg total) by mouth daily. Started by: Zara Council, PA-C   cyclobenzaprine 10 MG tablet Commonly known as: FLEXERIL Take 10 mg by mouth as needed.   cycloSPORINE 0.05 % ophthalmic emulsion Commonly known as: RESTASIS 1 drop 2 (two) times daily.   esomeprazole 20 MG capsule Commonly known as: NEXIUM Take 20 mg by mouth daily.   estradiol 0.1 MG/GM vaginal cream Commonly known as: ESTRACE Apply one pea-sized amount around the opening of the urethra three times weekly.   estradiol 1 MG tablet Commonly known as: ESTRACE Take 1 mg by mouth daily.   gabapentin 100 MG capsule Commonly known as: NEURONTIN Take 100 mg by mouth at bedtime.   Gemtesa 75 MG Tabs Generic drug: Vibegron Take 75 mg by mouth daily.   hydrALAZINE 25 MG tablet Commonly known as: APRESOLINE TAKE 1 TABLET BY MOUTH TWICE DAILY AT NOON AND AT BEDTIME   hyoscyamine 0.125 MG SL tablet Commonly known as: LEVSIN SL Take 2 tablets by mouth twice daily as needed for stomach cramps   magnesium gluconate 500 MG tablet Commonly known as: MAGONATE Take 500 mg by mouth daily.   melatonin 1 MG Tabs tablet Take  by mouth. Use as needed per bottle for insomnia   methenamine 1 g tablet Commonly known as: HIPREX TAKE 1 TABLET BY MOUTH TWICE DAILY WITH A MEAL   metoprolol tartrate 25 MG tablet Commonly known as: LOPRESSOR TAKE 1 TABLET BY MOUTH TWICE DAILY   nystatin cream Commonly known as: MYCOSTATIN Apply topically 3 (three) times daily.   VITAMIN D PO Take 1 tablet by mouth daily.        Allergies:  Allergies  Allergen Reactions   Angiotensin Receptor Blockers Other (See Comments)    hyperkalemia   Aspirin Other (See Comments)    Ecchymosis with high doses   Codeine Sulfate Nausea Only  Donepezil     Other reaction(s): Other (See Comments) Could not tolerate   Erythromycin Nausea And Vomiting   Sulfa Antibiotics Nausea Only   Sulfonamide Derivatives Nausea Only    Family History: Family History  Problem Relation Age of Onset   Diverticulosis Mother    Coronary artery disease Father    Heart disease Paternal Aunt    Heart disease Paternal Uncle    Breast cancer Paternal Aunt    Prostate cancer Brother    Leukemia Brother    Heart attack Brother    Hypertension Brother    Stroke Brother     Social History:  reports that she has never smoked. She has never been exposed to tobacco smoke. She has never used smokeless tobacco. She reports that she does not drink alcohol and does not use drugs.  ROS: Pertinent ROS in HPI  Physical Exam: BP (!) 174/78   Pulse 63   Ht '5\' 3"'$  (1.6 m)   Wt 127 lb (57.6 kg)   BMI 22.50 kg/m   Constitutional:  Well nourished. Alert and oriented, No acute distress. HEENT: [place] AT, moist mucus membranes.  Trachea midline, no masses. Cardiovascular: No clubbing, cyanosis, or edema. Respiratory: Normal respiratory effort, no increased work of breathing. GU: No CVA tenderness.  No bladder fullness or masses. Vulvovaginal atrophy w/ pallor, loss of rugae.  Vulvar thinning, fusion of labia, clitoral hood retraction, prominent urethral meatus.    Anus and perineum are without rashes or lesions.    Neurologic: Grossly intact, no focal deficits, moving all 4 extremities. Psychiatric: Normal mood and affect.    Laboratory Data: N/A  Pertinent Imaging: N/A  Assessment & Plan:    1. Gross hematuria -resolved  2. rUTI's -Still not received the urine culture from the other offices -explained that her current symptoms are not the result of an UTI as her last urine culture result was not positive for infection and to follow up with her PCP for further evaluation and management -Her daughter has expressed to me that her mother is quite anxious regarding developing another urinary tract infection in the future to the point it is making her life difficult and a decrease om her mother's quality of life -I felt at this time the risk of taking a prophylactic antibiotic would not outweigh the benefit of possibly giving her mother a peace of mind as long as they will follow-up with me for any symptoms of a breakthrough urinary tract infection  3. Mixed incontinence - continue Gemtesa 75 mg daily  Return for 2mh sympton check.  These notes generated with voice recognition software. I apologize for typographical errors.  [place] [address] [place] [phone]

## 2022-01-26 ENCOUNTER — Ambulatory Visit (INDEPENDENT_AMBULATORY_CARE_PROVIDER_SITE_OTHER): Payer: Medicare Other | Admitting: Urology

## 2022-01-26 ENCOUNTER — Encounter: Payer: Self-pay | Admitting: Urology

## 2022-01-26 VITALS — BP 174/78 | HR 63 | Ht 63.0 in | Wt 127.0 lb

## 2022-01-26 DIAGNOSIS — N3946 Mixed incontinence: Secondary | ICD-10-CM | POA: Diagnosis not present

## 2022-01-26 DIAGNOSIS — R310 Gross hematuria: Secondary | ICD-10-CM | POA: Diagnosis not present

## 2022-01-26 DIAGNOSIS — R3915 Urgency of urination: Secondary | ICD-10-CM | POA: Diagnosis not present

## 2022-01-26 DIAGNOSIS — N390 Urinary tract infection, site not specified: Secondary | ICD-10-CM

## 2022-01-26 DIAGNOSIS — R350 Frequency of micturition: Secondary | ICD-10-CM

## 2022-01-26 DIAGNOSIS — M545 Low back pain, unspecified: Secondary | ICD-10-CM

## 2022-01-26 MED ORDER — CEPHALEXIN 250 MG PO CAPS: 250.0000 mg | ORAL_CAPSULE | Freq: Every day | ORAL | 0 refills | Status: DC

## 2022-01-31 ENCOUNTER — Telehealth: Payer: Self-pay | Admitting: Physician Assistant

## 2022-01-31 DIAGNOSIS — Z87440 Personal history of urinary (tract) infections: Secondary | ICD-10-CM

## 2022-01-31 MED ORDER — ESTRADIOL 0.1 MG/GM VA CREA: TOPICAL_CREAM | VAGINAL | 5 refills | Status: DC

## 2022-01-31 NOTE — Telephone Encounter (Signed)
Pt is out of the Estradiol cream. Could you please send the RX to [facility] in [place].  Is there is generic version of this? Would like a 90 day supply.  Thank you.  Please advise daughter, [name] [phone]

## 2022-01-31 NOTE — Telephone Encounter (Signed)
They need Estradiol vaginal cream refilled, I will let them know that this is generic. It is in her chart but wanted to make sure it was ok to refill. [facility] to [facility] but patient saw [facility] recently and has follow up appointment in April

## 2022-01-31 NOTE — Telephone Encounter (Signed)
[name], patient's daughter, advised.

## 2022-02-14 ENCOUNTER — Other Ambulatory Visit: Payer: Self-pay | Admitting: Nurse Practitioner

## 2022-02-14 DIAGNOSIS — M545 Low back pain, unspecified: Secondary | ICD-10-CM

## 2022-02-14 DIAGNOSIS — M25552 Pain in left hip: Secondary | ICD-10-CM

## 2022-02-14 DIAGNOSIS — R599 Enlarged lymph nodes, unspecified: Secondary | ICD-10-CM

## 2022-02-14 DIAGNOSIS — M25551 Pain in right hip: Secondary | ICD-10-CM

## 2022-02-22 ENCOUNTER — Ambulatory Visit
Admission: RE | Admit: 2022-02-22 | Discharge: 2022-02-22 | Disposition: A | Discharge status: Home / Self Care | Payer: Medicare Other | Source: Ambulatory Visit | Attending: Nurse Practitioner | Admitting: Nurse Practitioner

## 2022-02-22 DIAGNOSIS — M25552 Pain in left hip: Secondary | ICD-10-CM | POA: Diagnosis present

## 2022-02-22 DIAGNOSIS — R599 Enlarged lymph nodes, unspecified: Secondary | ICD-10-CM

## 2022-02-22 DIAGNOSIS — M545 Low back pain, unspecified: Secondary | ICD-10-CM | POA: Diagnosis present

## 2022-02-22 DIAGNOSIS — M25551 Pain in right hip: Secondary | ICD-10-CM

## 2022-02-22 MED ORDER — IOHEXOL 300 MG/ML  SOLN
75.0000 mL | Freq: Once | INTRAMUSCULAR | Status: AC | PRN
  Administered 2022-02-22: 75 mL via INTRAVENOUS

## 2022-02-28 ENCOUNTER — Telehealth: Payer: Self-pay

## 2022-02-28 NOTE — Telephone Encounter (Signed)
Pt's daughter called and left vm regarding CT results, said she saw on mychart but wanted to confirm results. Requesting a call back

## 2022-03-07 ENCOUNTER — Other Ambulatory Visit: Payer: Self-pay | Admitting: Nurse Practitioner

## 2022-03-10 ENCOUNTER — Ambulatory Visit (INDEPENDENT_AMBULATORY_CARE_PROVIDER_SITE_OTHER): Payer: Medicare Other | Admitting: Nurse Practitioner

## 2022-03-10 VITALS — BP 168/70 | HR 68 | Ht 63.0 in | Wt 126.0 lb

## 2022-03-10 DIAGNOSIS — I10 Essential (primary) hypertension: Secondary | ICD-10-CM

## 2022-03-10 DIAGNOSIS — J301 Allergic rhinitis due to pollen: Secondary | ICD-10-CM | POA: Diagnosis not present

## 2022-03-10 DIAGNOSIS — R0982 Postnasal drip: Secondary | ICD-10-CM

## 2022-03-10 DIAGNOSIS — K582 Mixed irritable bowel syndrome: Secondary | ICD-10-CM

## 2022-03-10 DIAGNOSIS — R7301 Impaired fasting glucose: Secondary | ICD-10-CM

## 2022-03-10 DIAGNOSIS — Z780 Asymptomatic menopausal state: Secondary | ICD-10-CM

## 2022-03-10 DIAGNOSIS — K219 Gastro-esophageal reflux disease without esophagitis: Secondary | ICD-10-CM | POA: Diagnosis not present

## 2022-03-10 DIAGNOSIS — E782 Mixed hyperlipidemia: Secondary | ICD-10-CM

## 2022-03-10 NOTE — Patient Instructions (Signed)
1) Reviewed xrays with pt and daughter today 2) labs today including hormone level, pt sweating at night 3) Follow up appt in 6 months, fasting labs at that appt 4)  Pt continues HH PT

## 2022-03-10 NOTE — Progress Notes (Signed)
Established Patient Office Visit  Subjective:  Patient ID: Erin Hunt, female    DOB: [birth year]  Age: 89 y.o. MRN: [id number]  Chief Complaint  Patient presents with   Follow-up    Follow Up    1 month follow up     Past Medical History:  Diagnosis Date   Allergic rhinitis    Anemia    Aortic stenosis    Arthritis    Carpal tunnel syndrome    Cervical spine degeneration    Coronary artery calcification seen on CT scan    a. 12/2011 CT: dense coronary Ca2+; b. 06/2015 MV: EF 59%, basal infsept, mid infsept, apical spet defect, likely artifact 2/2 LBBB. No ischemia.   Diastolic dysfunction    a. 05/2013 Echo: EF 45-50%, Gr1 DD, mild to mod MR, mild AI. Mean grad 35mHg. Mild MR, mildly dil LA.   Diverticulosis    Essential hypertension    Fibromyalgia    GERD (gastroesophageal reflux disease)    IBS (irritable bowel syndrome)    IgG gammopathy    LBBB (left bundle branch block)    Orthostatic hypotension    PONV (postoperative nausea and vomiting)    Vitamin B12 deficiency     Social History   Socioeconomic History   Marital status: Widowed    Spouse name: Not on file   Number of children: Not on file   Years of education: Not on file   Highest education level: Not on file  Occupational History   Occupation: Retired    [employer] farm manager RETIRED    Comment: PImmunologist Tobacco Use   Smoking status: Never    Passive exposure: Never   Smokeless tobacco: Never  Substance and Sexual Activity   Alcohol use: No   Drug use: No   Sexual activity: Not Currently  Other Topics Concern   Not on file  Social History Narrative   Not on file   Social Determinants of Health   Financial Resource Strain: Not on file  Food Insecurity: Not on file  Transportation Needs: Not on file  Physical Activity: Not on file  Stress: Not on file  Social Connections: Not on file  Intimate Partner Violence: Not on file    Family History  Problem Relation Age of Onset    Diverticulosis Mother    Coronary artery disease Father    Heart disease Paternal Aunt    Heart disease Paternal Uncle    Breast cancer Paternal Aunt    Prostate cancer Brother    Leukemia Brother    Heart attack Brother    Hypertension Brother    Stroke Brother     Allergies  Allergen Reactions   Angiotensin Receptor Blockers Other (See Comments)    hyperkalemia   Aspirin Other (See Comments)    Ecchymosis with high doses   Codeine Sulfate Nausea Only   Donepezil     Other reaction(s): Other (See Comments) Could not tolerate   Erythromycin Nausea And Vomiting   Sulfa Antibiotics Nausea Only   Sulfonamide Derivatives Nausea Only    Review of Systems  Constitutional:  Positive for malaise/fatigue.  HENT:  Positive for congestion.   Eyes: Negative.   Respiratory:  Positive for cough.   Cardiovascular: Negative.   Gastrointestinal:  Positive for heartburn.  Genitourinary:  Positive for frequency.  Musculoskeletal: Negative.   Skin: Negative.   Neurological: Negative.   Endo/Heme/Allergies:  Bruises/bleeds easily.  Psychiatric/Behavioral:  The patient is nervous/anxious.  Objective:   BP (!) 168/70   Pulse 68   Ht 5' 3"$  (1.6 m)   Wt 126 lb (57.2 kg)   SpO2 94%   BMI 22.32 kg/m   Vitals:   03/10/22 1312  BP: (!) 168/70  Pulse: 68  Height: 5' 3"$  (1.6 m)  Weight: 126 lb (57.2 kg)  SpO2: 94%  BMI (Calculated): 22.33    Physical Exam Constitutional:      Appearance: Normal appearance.  HENT:     Head: Normocephalic.     Nose: Nose normal.  Eyes:     Pupils: Pupils are equal, round, and reactive to light.  Cardiovascular:     Rate and Rhythm: Normal rate and regular rhythm.  Pulmonary:     Effort: Pulmonary effort is normal.     Breath sounds: Normal breath sounds.  Abdominal:     General: Bowel sounds are normal.     Palpations: Abdomen is soft.  Musculoskeletal:        General: Normal range of motion.     Cervical back: Neck supple.   Skin:    General: Skin is warm and dry.  Neurological:     Mental Status: She is alert and oriented to person, place, and time.  Psychiatric:        Mood and Affect: Mood normal.        Behavior: Behavior normal.      No results found for any visits on 03/10/22.  Recent Results (from the past 2160 hour(s))  Urinalysis, Complete     Status: Abnormal   Collection Time: 12/29/21  1:03 PM  Result Value Ref Range   Specific Gravity, UA 0.010 (LL) 1.005 - 1.030   pH, UA 5.5 5.0 - 7.5   Color, UA Yellow Yellow   Appearance Ur Clear Clear   Leukocytes,UA Negative Negative   Protein,UA Negative Negative/Trace   Glucose, UA Negative Negative   Ketones, UA Negative Negative   RBC, UA Negative Negative   Bilirubin, UA Negative Negative   Urobilinogen, Ur 0.2 0.2 - 1.0 mg/dL   Nitrite, UA Negative Negative   Microscopic Examination See below:   Microscopic Examination     Status: Abnormal   Collection Time: 12/29/21  1:03 PM   Urine  Result Value Ref Range   WBC, UA 0-5 0 - 5 /hpf   RBC, Urine None seen 0 - 2 /hpf   Epithelial Cells (non renal) 0-10 0 - 10 /hpf   Renal Epithel, UA 0-10 (A) None seen /hpf   Bacteria, UA None seen None seen/Few  Bladder Scan (Post Void Residual) in office     Status: None   Collection Time: 12/29/21  1:14 PM  Result Value Ref Range   Scan Result 60m   CULTURE, URINE COMPREHENSIVE     Status: None   Collection Time: 12/29/21  3:06 PM   Specimen: Urine   UR  Result Value Ref Range   Urine Culture, Comprehensive Final report    Organism ID, Bacteria Comment     Comment: No growth in 36 - 48 hours.      Assessment & Plan:   Problem List Items Addressed This Visit       Cardiovascular and Mediastinum   Benign essential hypertension - Primary   Relevant Orders   TSH   CMP14+EGFR     Respiratory   Allergic rhinitis     Digestive   Gastroesophageal reflux disease   Irritable bowel syndrome  Other   Hyperlipidemia   Relevant  Orders   Lipid panel   Postnasal drip   Other Visit Diagnoses     Impaired fasting blood sugar       Relevant Orders   Hemoglobin A1c   Menopause       Relevant Orders   Estrogens, Total   FSH   LH       Return in about 6 months (around 09/08/2022) for Followup appt in 6 months, fasting labs at that appt.   Total time spent: 25 minutes  Evern Bio, NP  03/10/2022

## 2022-03-15 LAB — LUTEINIZING HORMONE: LH: 12.8 m[IU]/mL (ref 7.7–58.5)

## 2022-03-15 LAB — LIPID PANEL
Chol/HDL Ratio: 3.4 ratio (ref 0.0–4.4)
Cholesterol, Total: 233 mg/dL — ABNORMAL HIGH (ref 100–199)
HDL: 69 mg/dL (ref >39)
LDL Chol Calc (NIH): 144 mg/dL — ABNORMAL HIGH (ref 0–99)
Triglycerides: 113 mg/dL (ref 0–149)
VLDL Cholesterol Cal: 20 mg/dL (ref 5–40)

## 2022-03-15 LAB — CMP14+EGFR
ALT: 15 IU/L (ref 0–32)
AST: 15 IU/L (ref 0–40)
Albumin/Globulin Ratio: 1.7 (ref 1.2–2.2)
Albumin: 4.3 g/dL (ref 3.6–4.6)
Alkaline Phosphatase: 91 IU/L (ref 44–121)
BUN/Creatinine Ratio: 23 (ref 12–28)
BUN: 23 mg/dL (ref 10–36)
Bilirubin Total: 0.3 mg/dL (ref 0.0–1.2)
CO2: 25 mmol/L (ref 20–29)
Calcium: 9.8 mg/dL (ref 8.7–10.3)
Chloride: 98 mmol/L (ref 96–106)
Creatinine, Ser: 0.98 mg/dL (ref 0.57–1.00)
Globulin, Total: 2.6 g/dL (ref 1.5–4.5)
Glucose: 99 mg/dL (ref 70–99)
Potassium: 6.1 mmol/L — ABNORMAL HIGH (ref 3.5–5.2)
Sodium: 135 mmol/L (ref 134–144)
Total Protein: 6.9 g/dL (ref 6.0–8.5)
eGFR: 54 mL/min/{1.73_m2} — ABNORMAL LOW (ref >59)

## 2022-03-15 LAB — HEMOGLOBIN A1C
Est. average glucose Bld gHb Est-mCnc: 120 mg/dL
Hgb A1c MFr Bld: 5.8 % — ABNORMAL HIGH (ref 4.8–5.6)

## 2022-03-15 LAB — ESTROGENS, TOTAL: Estrogen: 946 pg/mL — ABNORMAL HIGH (ref 40–244)

## 2022-03-15 LAB — TSH: TSH: 4.75 u[IU]/mL — ABNORMAL HIGH (ref 0.450–4.500)

## 2022-03-15 LAB — FOLLICLE STIMULATING HORMONE: FSH: 18.3 m[IU]/mL — ABNORMAL LOW (ref 25.8–134.8)

## 2022-03-16 ENCOUNTER — Telehealth: Payer: Self-pay

## 2022-03-16 NOTE — Telephone Encounter (Signed)
Patient daughter called stating that she seen her mothers labs and seen that her potassium is elevated, she is asking if they should give her lasix today and to stop her electrolyte solution and that her horomone evels are very high they will stop those she is asking if youd call her if you get a change or message her via mychart

## 2022-03-16 NOTE — Telephone Encounter (Signed)
Patient daughter informed.

## 2022-03-24 ENCOUNTER — Other Ambulatory Visit: Payer: Medicare Other

## 2022-03-24 DIAGNOSIS — I10 Essential (primary) hypertension: Secondary | ICD-10-CM

## 2022-03-25 LAB — CMP14+EGFR
ALT: 16 IU/L (ref 0–32)
AST: 17 IU/L (ref 0–40)
Albumin/Globulin Ratio: 1.8 (ref 1.2–2.2)
Albumin: 4.2 g/dL (ref 3.6–4.6)
Alkaline Phosphatase: 91 IU/L (ref 44–121)
BUN/Creatinine Ratio: 23 (ref 12–28)
BUN: 23 mg/dL (ref 10–36)
Bilirubin Total: 0.2 mg/dL (ref 0.0–1.2)
CO2: 24 mmol/L (ref 20–29)
Calcium: 9.1 mg/dL (ref 8.7–10.3)
Chloride: 92 mmol/L — ABNORMAL LOW (ref 96–106)
Creatinine, Ser: 0.98 mg/dL (ref 0.57–1.00)
Globulin, Total: 2.4 g/dL (ref 1.5–4.5)
Glucose: 81 mg/dL (ref 70–99)
Potassium: 5 mmol/L (ref 3.5–5.2)
Sodium: 129 mmol/L — ABNORMAL LOW (ref 134–144)
Total Protein: 6.6 g/dL (ref 6.0–8.5)
eGFR: 54 mL/min/{1.73_m2} — ABNORMAL LOW (ref >59)

## 2022-04-04 ENCOUNTER — Other Ambulatory Visit: Payer: Medicare Other

## 2022-04-04 DIAGNOSIS — I10 Essential (primary) hypertension: Secondary | ICD-10-CM

## 2022-04-05 ENCOUNTER — Encounter: Payer: Self-pay | Admitting: Nurse Practitioner

## 2022-04-05 LAB — CMP14+EGFR
ALT: 14 IU/L (ref 0–32)
AST: 16 IU/L (ref 0–40)
Albumin/Globulin Ratio: 1.6 (ref 1.2–2.2)
Albumin: 3.9 g/dL (ref 3.6–4.6)
Alkaline Phosphatase: 95 IU/L (ref 44–121)
BUN/Creatinine Ratio: 26 (ref 12–28)
BUN: 22 mg/dL (ref 10–36)
Bilirubin Total: 0.3 mg/dL (ref 0.0–1.2)
CO2: 22 mmol/L (ref 20–29)
Calcium: 9.1 mg/dL (ref 8.7–10.3)
Chloride: 97 mmol/L (ref 96–106)
Creatinine, Ser: 0.84 mg/dL (ref 0.57–1.00)
Globulin, Total: 2.5 g/dL (ref 1.5–4.5)
Glucose: 88 mg/dL (ref 70–99)
Potassium: 5.5 mmol/L — ABNORMAL HIGH (ref 3.5–5.2)
Sodium: 132 mmol/L — ABNORMAL LOW (ref 134–144)
Total Protein: 6.4 g/dL (ref 6.0–8.5)
eGFR: 66 mL/min/{1.73_m2} (ref >59)

## 2022-04-11 ENCOUNTER — Other Ambulatory Visit: Payer: Self-pay | Admitting: Urology

## 2022-04-11 DIAGNOSIS — N390 Urinary tract infection, site not specified: Secondary | ICD-10-CM

## 2022-04-18 ENCOUNTER — Other Ambulatory Visit: Payer: Medicare Other

## 2022-04-18 ENCOUNTER — Other Ambulatory Visit: Payer: Self-pay | Admitting: Nurse Practitioner

## 2022-04-18 DIAGNOSIS — E876 Hypokalemia: Secondary | ICD-10-CM

## 2022-04-19 ENCOUNTER — Encounter: Payer: Self-pay | Admitting: Nurse Practitioner

## 2022-04-19 ENCOUNTER — Other Ambulatory Visit: Payer: Self-pay | Admitting: Nurse Practitioner

## 2022-04-19 DIAGNOSIS — E871 Hypo-osmolality and hyponatremia: Secondary | ICD-10-CM

## 2022-04-19 LAB — POTASSIUM: Potassium: 5.1 mmol/L (ref 3.5–5.2)

## 2022-04-20 ENCOUNTER — Other Ambulatory Visit: Payer: Self-pay | Admitting: Nurse Practitioner

## 2022-04-20 DIAGNOSIS — E871 Hypo-osmolality and hyponatremia: Secondary | ICD-10-CM

## 2022-04-20 LAB — SODIUM: Sodium: 133 mmol/L — ABNORMAL LOW (ref 134–144)

## 2022-04-20 LAB — SPECIMEN STATUS REPORT

## 2022-04-26 ENCOUNTER — Telehealth: Payer: Self-pay

## 2022-04-26 NOTE — Progress Notes (Incomplete)
04/27/2022 2:21 PM   Erin Hunt 12-Nov-1928 [id number]  Referring provider: Perrin Maltese, MD [address] [place],  [place] [address]  Urological history: 1. rUTI's -contributing factors of age, vaginal atrophy, IBS and incontinence -cysto (11/2021) - NED -documented urine cultures over the last year  12/29/2021 - No Growth   10/18/2021 - MUF -Hiprex 1 gram BID and vaginal estrogen cream    2. Incontinence -contributing factors of age, vaginal atrophy, pelvic floor laxity and muscle relaxers -Gemtesa 75 mg daily   3. Pelvic floor laxity -has seen PT     No chief complaint on file.   HPI: Erin Hunt is a 89 y.o. female who presents today for 3 month follow up.    At her visit on 12/29/2021, she has been having 1-7 daytime urinations, occasional 1-2 nocturia with a severe urgency to urinate in the a.m.  She does have urge incontinence.  She has urinary leakage 1-2 times daily.  She has 3+ pantiliners daily.  She does not toilet map and she does not restrict her fluid.  Her daughter states that she has had 3 urinary tract infections since September treated by her primary care physician in an urgent care clinic.  I do not have those records available to me today.  The daughter states that the urine cultures were positive for E. coli and they treated her with Macrobid.  Mrs. West states that with the urinary tract infections in September and October it was associated with gross hematuria.  All 3 infections had symptoms of malaise, a tearing sensation in the suprapubic area, lower back pain and burning with urination.  Mrs. Kliebert states she still feels miserable and continues to have lower back pain with an increase of her urinary urgency, frequency and urge incontinence over the last 2 weeks.  UA yellow clear, specific gravity 1.010, trace blood, pH 5.0, 6-10 WBCs, greater than 10 epithelial cells, greater than 10 renal epithelial cells and many bacteria.  CATH UA yellow  clear, specific gravity 1.010, pH 5.5, WBC 0-5, 0-10 epithelial cells and 0-10 renal epithelial cells.  PVR 0 mL.  Urine culture was negative.   At her visit on 01/26/2022, she states she is still feeling poorly.  She is experiencing dizzy spells and LBP.  Patient denies any modifying or aggravating factors.  Patient denies any gross hematuria, dysuria or suprapubic/flank pain.  Patient denies any fevers, chills, nausea or vomiting.    PMH: Past Medical History:  Diagnosis Date   Allergic rhinitis    Anemia    Aortic stenosis    Arthritis    Carpal tunnel syndrome    Cervical spine degeneration    Coronary artery calcification seen on CT scan    a. 12/2011 CT: dense coronary Ca2+; b. 06/2015 MV: EF 59%, basal infsept, mid infsept, apical spet defect, likely artifact 2/2 LBBB. No ischemia.   Diastolic dysfunction    a. 05/2013 Echo: EF 45-50%, Gr1 DD, mild to mod MR, mild AI. Mean grad 45mmHg. Mild MR, mildly dil LA.   Diverticulosis    Essential hypertension    Fibromyalgia    GERD (gastroesophageal reflux disease)    IBS (irritable bowel syndrome)    IgG gammopathy    LBBB (left bundle branch block)    Orthostatic hypotension    PONV (postoperative nausea and vomiting)    Vitamin B12 deficiency     Surgical History: Past Surgical History:  Procedure Laterality Date   BREAST EXCISIONAL  BIOPSY     BREAST LUMPECTOMY WITH RADIOACTIVE SEED LOCALIZATION Left 06/25/2014   Procedure: LEFT BREAST LUMPECTOMY WITH RADIOACTIVE SEED LOCALIZATION;  Surgeon: Coralie Keens, MD;  Location: [facility];  Service: General;  Laterality: Left;   CARDIAC CATHETERIZATION     CHOLECYSTECTOMY     TSA     VAGINAL HYSTERECTOMY     L ovary removed   VESICOVAGINAL FISTULA CLOSURE W/ TAH      Home Medications:  Allergies as of 04/27/2022       Reactions   Angiotensin Receptor Blockers Other (See Comments)   hyperkalemia   Aspirin Other (See Comments)   Ecchymosis with high doses    Codeine Sulfate Nausea Only   Donepezil    Other reaction(s): Other (See Comments) Could not tolerate   Erythromycin Nausea And Vomiting   Sulfa Antibiotics Nausea Only   Sulfonamide Derivatives Nausea Only        Medication List        Accurate as of April 26, 2022  2:21 PM. If you have any questions, ask your nurse or doctor.          ALIGN PO Take 1 tablet by mouth at bedtime.   buPROPion 150 MG 24 hr tablet Commonly known as: WELLBUTRIN XL Take 150 mg by mouth every morning.   celecoxib 200 MG capsule Commonly known as: CELEBREX Take 200 mg by mouth as needed (joint pain).   cephALEXin 250 MG capsule Commonly known as: KEFLEX TAKE 1 CAPSULE BY MOUTH ONCE DAILY   cyclobenzaprine 10 MG tablet Commonly known as: FLEXERIL Take 10 mg by mouth as needed.   cycloSPORINE 0.05 % ophthalmic emulsion Commonly known as: RESTASIS 1 drop 2 (two) times daily.   esomeprazole 20 MG capsule Commonly known as: NEXIUM Take 20 mg by mouth daily.   estradiol 0.1 MG/GM vaginal cream Commonly known as: ESTRACE Apply one pea-sized amount around the opening of the urethra three times weekly.   estradiol 1 MG tablet Commonly known as: ESTRACE TAKE 1 TABLET BY MOUTH ONCE DAILY *DOSE INCREASED*   gabapentin 100 MG capsule Commonly known as: NEURONTIN Take 100 mg by mouth at bedtime.   Gemtesa 75 MG Tabs Generic drug: Vibegron Take 75 mg by mouth daily.   hydrALAZINE 25 MG tablet Commonly known as: APRESOLINE TAKE 1 TABLET BY MOUTH TWICE DAILY AT NOON AND AT BEDTIME   hyoscyamine 0.125 MG SL tablet Commonly known as: LEVSIN SL Take 2 tablets by mouth twice daily as needed for stomach cramps   magnesium gluconate 500 MG tablet Commonly known as: MAGONATE Take 500 mg by mouth daily.   melatonin 1 MG Tabs tablet Take by mouth. Use as needed per bottle for insomnia   methenamine 1 g tablet Commonly known as: HIPREX TAKE 1 TABLET BY MOUTH TWICE DAILY WITH A MEAL    metoprolol tartrate 25 MG tablet Commonly known as: LOPRESSOR TAKE 1 TABLET BY MOUTH TWICE DAILY   nystatin cream Commonly known as: MYCOSTATIN Apply topically 3 (three) times daily.   VITAMIN D PO Take 1 tablet by mouth daily.        Allergies:  Allergies  Allergen Reactions   Angiotensin Receptor Blockers Other (See Comments)    hyperkalemia   Aspirin Other (See Comments)    Ecchymosis with high doses   Codeine Sulfate Nausea Only   Donepezil     Other reaction(s): Other (See Comments) Could not tolerate   Erythromycin Nausea And Vomiting  Sulfa Antibiotics Nausea Only   Sulfonamide Derivatives Nausea Only    Family History: Family History  Problem Relation Age of Onset   Diverticulosis Mother    Coronary artery disease Father    Heart disease Paternal Aunt    Heart disease Paternal Uncle    Breast cancer Paternal Aunt    Prostate cancer Brother    Leukemia Brother    Heart attack Brother    Hypertension Brother    Stroke Brother     Social History:  reports that she has never smoked. She has never been exposed to tobacco smoke. She has never used smokeless tobacco. She reports that she does not drink alcohol and does not use drugs.  ROS: Pertinent ROS in HPI  Physical Exam: There were no vitals taken for this visit.  Constitutional:  Well nourished. Alert and oriented, No acute distress. HEENT: [place] AT, moist mucus membranes.  Trachea midline Cardiovascular: No clubbing, cyanosis, or edema. Respiratory: Normal respiratory effort, no increased work of breathing. Neurologic: Grossly intact, no focal deficits, moving all 4 extremities. Psychiatric: Normal mood and affect.    Laboratory Data: Component     Latest Ref Rng 04/04/2022  Glucose     70 - 99 mg/dL 88   BUN     10 - 36 mg/dL 22   Creatinine     0.57 - 1.00 mg/dL 0.84   BUN/Creatinine Ratio     12 - 28  26   Sodium     134 - 144 mmol/L 132 (L)   Potassium     3.5 - 5.2 mmol/L 5.5 (H)    Chloride     96 - 106 mmol/L 97   CO2     20 - 29 mmol/L 22   Calcium     8.7 - 10.3 mg/dL 9.1   eGFR     >59 mL/min/1.73 66   Total Protein     6.0 - 8.5 g/dL 6.4   Albumin     3.6 - 4.6 g/dL 3.9   Globulin, Total     1.5 - 4.5 g/dL 2.5   Albumin/Globulin Ratio     1.2 - 2.2  1.6   Total Bilirubin     0.0 - 1.2 mg/dL 0.3   Alkaline Phosphatase     44 - 121 IU/L 95   AST     0 - 40 IU/L 16   ALT     0 - 32 IU/L 14     Legend: (L) Low (H) High  Component     Latest Ref Rng 03/10/2022  LH     7.7 - 58.5 mIU/mL 12.8     Component     Latest Ref Rng 03/10/2022  FSH     25.8 - 134.8 mIU/mL 18.3 (L)     Legend: (L) Low  Component     Latest Ref Rng 03/10/2022  Estrogen     40 - 244 pg/mL 946 (H)     Legend: (H) High   Pertinent Imaging: N/A  Assessment & Plan:    1. rUTI's -***  2. Mixed incontinence - continue Gemtesa 75 mg daily  No follow-ups on file.  These notes generated with voice recognition software. I apologize for typographical errors.  [place], [place] [address]  [place], [place] [address] [phone]

## 2022-04-26 NOTE — Telephone Encounter (Signed)
Erin Hunt with [facility] called and left vm regarding pt, just wanted to inform us that pt had a fall on Saturday 3/30, said she was trying to grab her medication and fell on her right side, left elbow skin tear & right elbow has some bruising, pt's daughter is cleaning skin tear for pt. She just called to inform us about this

## 2022-04-27 ENCOUNTER — Ambulatory Visit (INDEPENDENT_AMBULATORY_CARE_PROVIDER_SITE_OTHER): Payer: Medicare Other | Admitting: Urology

## 2022-04-27 ENCOUNTER — Other Ambulatory Visit: Payer: Medicare Other

## 2022-04-27 ENCOUNTER — Encounter: Payer: Self-pay | Admitting: Urology

## 2022-04-27 VITALS — BP 187/83 | HR 65 | Ht 63.0 in | Wt 127.8 lb

## 2022-04-27 DIAGNOSIS — E876 Hypokalemia: Secondary | ICD-10-CM

## 2022-04-27 DIAGNOSIS — Z87440 Personal history of urinary (tract) infections: Secondary | ICD-10-CM | POA: Diagnosis not present

## 2022-04-27 DIAGNOSIS — I10 Essential (primary) hypertension: Secondary | ICD-10-CM

## 2022-04-27 DIAGNOSIS — N390 Urinary tract infection, site not specified: Secondary | ICD-10-CM

## 2022-04-27 DIAGNOSIS — N3946 Mixed incontinence: Secondary | ICD-10-CM | POA: Diagnosis not present

## 2022-04-28 ENCOUNTER — Ambulatory Visit (INDEPENDENT_AMBULATORY_CARE_PROVIDER_SITE_OTHER): Payer: Medicare Other | Admitting: Nurse Practitioner

## 2022-04-28 VITALS — BP 140/86 | HR 74 | Ht 63.0 in | Wt 130.4 lb

## 2022-04-28 DIAGNOSIS — R5383 Other fatigue: Secondary | ICD-10-CM

## 2022-04-28 DIAGNOSIS — I10 Essential (primary) hypertension: Secondary | ICD-10-CM

## 2022-04-28 DIAGNOSIS — J301 Allergic rhinitis due to pollen: Secondary | ICD-10-CM | POA: Diagnosis not present

## 2022-04-28 DIAGNOSIS — E875 Hyperkalemia: Secondary | ICD-10-CM

## 2022-04-28 LAB — CBC WITH DIFFERENTIAL
Basophils Absolute: 0.1 10*3/uL (ref 0.0–0.2)
Basos: 1 %
EOS (ABSOLUTE): 0.2 10*3/uL (ref 0.0–0.4)
Eos: 3 %
Hematocrit: 41.3 % (ref 34.0–46.6)
Hemoglobin: 13.1 g/dL (ref 11.1–15.9)
Immature Grans (Abs): 0 10*3/uL (ref 0.0–0.1)
Immature Granulocytes: 0 %
Lymphocytes Absolute: 1.9 10*3/uL (ref 0.7–3.1)
Lymphs: 26 %
MCH: 27.1 pg (ref 26.6–33.0)
MCHC: 31.7 g/dL (ref 31.5–35.7)
MCV: 86 fL (ref 79–97)
Monocytes Absolute: 0.7 10*3/uL (ref 0.1–0.9)
Monocytes: 10 %
Neutrophils Absolute: 4.2 10*3/uL (ref 1.4–7.0)
Neutrophils: 60 %
RBC: 4.83 x10E6/uL (ref 3.77–5.28)
RDW: 13.4 % (ref 11.7–15.4)
WBC: 7.1 10*3/uL (ref 3.4–10.8)

## 2022-04-28 LAB — CMP14+EGFR
ALT: 59 IU/L — ABNORMAL HIGH (ref 0–32)
AST: 39 IU/L (ref 0–40)
Albumin/Globulin Ratio: 1.7 (ref 1.2–2.2)
Albumin: 4.2 g/dL (ref 3.6–4.6)
Alkaline Phosphatase: 120 IU/L (ref 44–121)
BUN/Creatinine Ratio: 20 (ref 12–28)
BUN: 19 mg/dL (ref 10–36)
Bilirubin Total: 0.4 mg/dL (ref 0.0–1.2)
CO2: 22 mmol/L (ref 20–29)
Calcium: 9.5 mg/dL (ref 8.7–10.3)
Chloride: 99 mmol/L (ref 96–106)
Creatinine, Ser: 0.97 mg/dL (ref 0.57–1.00)
Globulin, Total: 2.5 g/dL (ref 1.5–4.5)
Glucose: 100 mg/dL — ABNORMAL HIGH (ref 70–99)
Potassium: 5.4 mmol/L — ABNORMAL HIGH (ref 3.5–5.2)
Sodium: 134 mmol/L (ref 134–144)
Total Protein: 6.7 g/dL (ref 6.0–8.5)
eGFR: 55 mL/min/{1.73_m2} — ABNORMAL LOW (ref >59)

## 2022-04-28 NOTE — Patient Instructions (Signed)
1) Suggest PT 2 x weekly, once weekly 2) Recheck BMP in 2 weeks 3) Cut down to 1 sodium tab twice daily 4) Cooler clothing when sleeping 5) Follow up appt in 3 months

## 2022-04-28 NOTE — Progress Notes (Signed)
Established Patient Office Visit  Subjective:  Patient ID: Erin Hunt, female    DOB: April 05, 1928  Age: 89 y.o. MRN: [id number]  Chief Complaint  Patient presents with   Acute Visit    Body weakness    Acute visit for weakness.  Patient has no headaches, has had night sweats 3 nights per week, cutting back now on sodium tabs, also suggesting PT visit patient 2 x per week instead of one.      No other concerns at this time.   Past Medical History:  Diagnosis Date   Allergic rhinitis    Anemia    Aortic stenosis    Arthritis    Carpal tunnel syndrome    Cervical spine degeneration    Coronary artery calcification seen on CT scan    a. 12/2011 CT: dense coronary Ca2+; b. 06/2015 MV: EF 59%, basal infsept, mid infsept, apical spet defect, likely artifact 2/2 LBBB. No ischemia.   Diastolic dysfunction    a. 05/2013 Echo: EF 45-50%, Gr1 DD, mild to mod MR, mild AI. Mean grad 69mmHg. Mild MR, mildly dil LA.   Diverticulosis    Essential hypertension    Fibromyalgia    GERD (gastroesophageal reflux disease)    IBS (irritable bowel syndrome)    IgG gammopathy    LBBB (left bundle branch block)    Orthostatic hypotension    PONV (postoperative nausea and vomiting)    Vitamin B12 deficiency     Past Surgical History:  Procedure Laterality Date   BREAST EXCISIONAL BIOPSY     BREAST LUMPECTOMY WITH RADIOACTIVE SEED LOCALIZATION Left 06/25/2014   Procedure: LEFT BREAST LUMPECTOMY WITH RADIOACTIVE SEED LOCALIZATION;  Surgeon: Coralie Keens, MD;  Location: [facility];  Service: General;  Laterality: Left;   CARDIAC CATHETERIZATION     CHOLECYSTECTOMY     TSA     VAGINAL HYSTERECTOMY     L ovary removed   VESICOVAGINAL FISTULA CLOSURE W/ TAH      Social History   Socioeconomic History   Marital status: Widowed    Spouse name: Not on file   Number of children: Not on file   Years of education: Not on file   Highest education level: Not on file   Occupational History   Occupation: Retired    Fish farm manager: RETIRED    Comment: Immunologist  Tobacco Use   Smoking status: Never    Passive exposure: Never   Smokeless tobacco: Never  Substance and Sexual Activity   Alcohol use: No   Drug use: No   Sexual activity: Not Currently  Other Topics Concern   Not on file  Social History Narrative   Not on file   Social Determinants of Health   Financial Resource Strain: Not on file  Food Insecurity: Not on file  Transportation Needs: Not on file  Physical Activity: Not on file  Stress: Not on file  Social Connections: Not on file  Intimate Partner Violence: Not on file    Family History  Problem Relation Age of Onset   Diverticulosis Mother    Coronary artery disease Father    Heart disease Paternal Aunt    Heart disease Paternal Uncle    Breast cancer Paternal Aunt    Prostate cancer Brother    Leukemia Brother    Heart attack Brother    Hypertension Brother    Stroke Brother     Allergies  Allergen Reactions   Angiotensin Receptor Blockers Other (See  Comments)    hyperkalemia   Aspirin Other (See Comments)    Ecchymosis with high doses   Codeine Sulfate Nausea Only   Donepezil     Other reaction(s): Other (See Comments) Could not tolerate   Erythromycin Nausea And Vomiting   Sulfa Antibiotics Nausea Only   Sulfonamide Derivatives Nausea Only    Review of Systems  Constitutional:  Positive for malaise/fatigue.  HENT: Negative.    Eyes: Negative.   Respiratory: Negative.    Cardiovascular: Negative.   Gastrointestinal: Negative.   Genitourinary: Negative.   Musculoskeletal:  Positive for myalgias.  Skin: Negative.   Neurological: Negative.   Endo/Heme/Allergies: Negative.   Psychiatric/Behavioral: Negative.         Objective:   BP (!) 140/86   Pulse 74   Ht 5\' 3"  (1.6 m)   Wt 130 lb 6.4 oz (59.1 kg)   SpO2 99%   BMI 23.10 kg/m   Vitals:   04/28/22 1342  BP: (!) 140/86  Pulse: 74   Height: 5\' 3"  (1.6 m)  Weight: 130 lb 6.4 oz (59.1 kg)  SpO2: 99%  BMI (Calculated): 23.11    Physical Exam Vitals reviewed.  Constitutional:      Appearance: Normal appearance.  HENT:     Head: Normocephalic.     Nose: Nose normal.     Mouth/Throat:     Mouth: Mucous membranes are moist.  Eyes:     Pupils: Pupils are equal, round, and reactive to light.  Cardiovascular:     Rate and Rhythm: Normal rate and regular rhythm.  Pulmonary:     Effort: Pulmonary effort is normal.     Breath sounds: Normal breath sounds.  Abdominal:     General: Bowel sounds are normal.     Palpations: Abdomen is soft.  Musculoskeletal:        General: Tenderness present.     Cervical back: Normal range of motion and neck supple.  Skin:    General: Skin is warm and dry.  Neurological:     Mental Status: She is alert and oriented to person, place, and time.  Psychiatric:        Mood and Affect: Mood normal.        Behavior: Behavior normal.      No results found for any visits on 04/28/22.  Recent Results (from the past 2160 hour(s))  Hemoglobin A1c     Status: Abnormal   Collection Time: 03/10/22  1:43 PM  Result Value Ref Range   Hgb A1c MFr Bld 5.8 (H) 4.8 - 5.6 %    Comment:          Prediabetes: 5.7 - 6.4          Diabetes: >6.4          Glycemic control for adults with diabetes: <7.0    Est. average glucose Bld gHb Est-mCnc 120 mg/dL  TSH     Status: Abnormal   Collection Time: 03/10/22  1:43 PM  Result Value Ref Range   TSH 4.750 (H) 0.450 - 4.500 uIU/mL  CMP14+EGFR     Status: Abnormal   Collection Time: 03/10/22  1:43 PM  Result Value Ref Range   Glucose 99 70 - 99 mg/dL   BUN 23 10 - 36 mg/dL   Creatinine, Ser 0.98 0.57 - 1.00 mg/dL   eGFR 54 (L) >59 mL/min/1.73   BUN/Creatinine Ratio 23 12 - 28   Sodium 135 134 - 144 mmol/L   Potassium 6.1 (  H) 3.5 - 5.2 mmol/L   Chloride 98 96 - 106 mmol/L   CO2 25 20 - 29 mmol/L   Calcium 9.8 8.7 - 10.3 mg/dL   Total Protein  6.9 6.0 - 8.5 g/dL   Albumin 4.3 3.6 - 4.6 g/dL   Globulin, Total 2.6 1.5 - 4.5 g/dL   Albumin/Globulin Ratio 1.7 1.2 - 2.2   Bilirubin Total 0.3 0.0 - 1.2 mg/dL   Alkaline Phosphatase 91 44 - 121 IU/L   AST 15 0 - 40 IU/L   ALT 15 0 - 32 IU/L  Lipid panel     Status: Abnormal   Collection Time: 03/10/22  1:43 PM  Result Value Ref Range   Cholesterol, Total 233 (H) 100 - 199 mg/dL   Triglycerides 113 0 - 149 mg/dL   HDL 69 >39 mg/dL   VLDL Cholesterol Cal 20 5 - 40 mg/dL   LDL Chol Calc (NIH) 144 (H) 0 - 99 mg/dL   Chol/HDL Ratio 3.4 0.0 - 4.4 ratio    Comment:                                   T. Chol/HDL Ratio                                             Men  Women                               1/2 Avg.Risk  3.4    3.3                                   Avg.Risk  5.0    4.4                                2X Avg.Risk  9.6    7.1                                3X Avg.Risk 23.4   11.0   Estrogens, Total     Status: Abnormal   Collection Time: 03/10/22  1:43 PM  Result Value Ref Range   Estrogen 946 (H) 40 - 244 pg/mL    Comment:                          Prepubertal             < 40                          Female Cycle:                            1-10 Days         16 - 328                            11-20 Days        34 - 501  21-30 Days        48 - 350                            Post-Menopausal   40 - 244   FSH     Status: Abnormal   Collection Time: 03/10/22  1:43 PM  Result Value Ref Range   FSH 18.3 (L) 25.8 - 134.8 mIU/mL    Comment:                      Adult Female             Range                       Follicular phase      3.5 -  12.5                       Ovulation phase       4.7 -  21.5                       Luteal phase          1.7 -   7.7                       Postmenopausal       25.8 - 134.8   LH     Status: None   Collection Time: 03/10/22  1:43 PM  Result Value Ref Range   LH 12.8 7.7 - 58.5 mIU/mL    Comment:                       Adult Female              Range                       Follicular phase      2.4 -  12.6                       Ovulation phase      14.0 -  95.6                       Luteal phase          1.0 -  11.4                       Postmenopausal        7.7 -  58.5   CMP14+EGFR     Status: Abnormal   Collection Time: 03/24/22  1:41 PM  Result Value Ref Range   Glucose 81 70 - 99 mg/dL   BUN 23 10 - 36 mg/dL   Creatinine, Ser 0.98 0.57 - 1.00 mg/dL   eGFR 54 (L) >59 mL/min/1.73   BUN/Creatinine Ratio 23 12 - 28   Sodium 129 (L) 134 - 144 mmol/L   Potassium 5.0 3.5 - 5.2 mmol/L   Chloride 92 (L) 96 - 106 mmol/L   CO2 24 20 - 29 mmol/L   Calcium 9.1 8.7 - 10.3 mg/dL   Total Protein 6.6 6.0 - 8.5 g/dL   Albumin 4.2 3.6 - 4.6 g/dL   Globulin, Total 2.4 1.5 - 4.5 g/dL   Albumin/Globulin Ratio 1.8 1.2 - 2.2  Bilirubin Total 0.2 0.0 - 1.2 mg/dL   Alkaline Phosphatase 91 44 - 121 IU/L   AST 17 0 - 40 IU/L   ALT 16 0 - 32 IU/L  CMP14+EGFR     Status: Abnormal   Collection Time: 04/04/22  1:59 PM  Result Value Ref Range   Glucose 88 70 - 99 mg/dL   BUN 22 10 - 36 mg/dL   Creatinine, Ser 0.84 0.57 - 1.00 mg/dL   eGFR 66 >59 mL/min/1.73   BUN/Creatinine Ratio 26 12 - 28   Sodium 132 (L) 134 - 144 mmol/L   Potassium 5.5 (H) 3.5 - 5.2 mmol/L   Chloride 97 96 - 106 mmol/L   CO2 22 20 - 29 mmol/L   Calcium 9.1 8.7 - 10.3 mg/dL   Total Protein 6.4 6.0 - 8.5 g/dL   Albumin 3.9 3.6 - 4.6 g/dL   Globulin, Total 2.5 1.5 - 4.5 g/dL   Albumin/Globulin Ratio 1.6 1.2 - 2.2   Bilirubin Total 0.3 0.0 - 1.2 mg/dL   Alkaline Phosphatase 95 44 - 121 IU/L   AST 16 0 - 40 IU/L   ALT 14 0 - 32 IU/L  Potassium     Status: None   Collection Time: 04/18/22  1:17 PM  Result Value Ref Range   Potassium 5.1 3.5 - 5.2 mmol/L  Sodium     Status: Abnormal   Collection Time: 04/18/22  1:17 PM  Result Value Ref Range   Sodium 133 (L) 134 - 144 mmol/L  Specimen status report     Status: None   Collection  Time: 04/18/22  1:17 PM  Result Value Ref Range   specimen status report Comment     Comment: Written Authorization Written Authorization Written Authorization Received. Authorization received from Lovina Reach 04-19-2022 Logged by Leona Carry   CMP14+EGFR     Status: Abnormal   Collection Time: 04/27/22  1:56 PM  Result Value Ref Range   Glucose 100 (H) 70 - 99 mg/dL   BUN 19 10 - 36 mg/dL   Creatinine, Ser 0.97 0.57 - 1.00 mg/dL   eGFR 55 (L) >59 mL/min/1.73   BUN/Creatinine Ratio 20 12 - 28   Sodium 134 134 - 144 mmol/L   Potassium 5.4 (H) 3.5 - 5.2 mmol/L   Chloride 99 96 - 106 mmol/L   CO2 22 20 - 29 mmol/L   Calcium 9.5 8.7 - 10.3 mg/dL   Total Protein 6.7 6.0 - 8.5 g/dL   Albumin 4.2 3.6 - 4.6 g/dL   Globulin, Total 2.5 1.5 - 4.5 g/dL   Albumin/Globulin Ratio 1.7 1.2 - 2.2   Bilirubin Total 0.4 0.0 - 1.2 mg/dL   Alkaline Phosphatase 120 44 - 121 IU/L   AST 39 0 - 40 IU/L   ALT 59 (H) 0 - 32 IU/L  CBC With Differential     Status: None   Collection Time: 04/27/22  1:56 PM  Result Value Ref Range   WBC 7.1 3.4 - 10.8 x10E3/uL   RBC 4.83 3.77 - 5.28 x10E6/uL   Hemoglobin 13.1 11.1 - 15.9 g/dL   Hematocrit 41.3 34.0 - 46.6 %   MCV 86 79 - 97 fL   MCH 27.1 26.6 - 33.0 pg   MCHC 31.7 31.5 - 35.7 g/dL   RDW 13.4 11.7 - 15.4 %   Neutrophils 60 Not Estab. %   Lymphs 26 Not Estab. %   Monocytes 10 Not Estab. %   Eos 3 Not Estab. %  Basos 1 Not Estab. %   Neutrophils Absolute 4.2 1.4 - 7.0 x10E3/uL   Lymphocytes Absolute 1.9 0.7 - 3.1 x10E3/uL   Monocytes Absolute 0.7 0.1 - 0.9 x10E3/uL   EOS (ABSOLUTE) 0.2 0.0 - 0.4 x10E3/uL   Basophils Absolute 0.1 0.0 - 0.2 x10E3/uL   Immature Granulocytes 0 Not Estab. %   Immature Grans (Abs) 0.0 0.0 - 0.1 x10E3/uL      Assessment & Plan:   Problem List Items Addressed This Visit       Cardiovascular and Mediastinum   Benign essential hypertension - Primary     Respiratory   Allergic rhinitis     Other   Other  fatigue   Hyperkalemia    No follow-ups on file.   Total time spent: 35 minutes  Evern Bio, NP  04/28/2022

## 2022-05-03 ENCOUNTER — Other Ambulatory Visit: Payer: Self-pay | Admitting: Nurse Practitioner

## 2022-05-03 ENCOUNTER — Encounter: Payer: Self-pay | Admitting: Nurse Practitioner

## 2022-05-03 ENCOUNTER — Ambulatory Visit (INDEPENDENT_AMBULATORY_CARE_PROVIDER_SITE_OTHER): Payer: Medicare Other | Admitting: Nurse Practitioner

## 2022-05-03 DIAGNOSIS — N761 Subacute and chronic vaginitis: Secondary | ICD-10-CM | POA: Diagnosis not present

## 2022-05-03 LAB — POCT URINALYSIS DIPSTICK
Bilirubin, UA: 1
Blood, UA: 200
Glucose, UA: NEGATIVE
Ketones, UA: NEGATIVE
Nitrite, UA: NEGATIVE
Protein, UA: POSITIVE — AB
Spec Grav, UA: 1.025 (ref 1.010–1.025)
Urobilinogen, UA: 0.2 E.U./dL
pH, UA: 6 (ref 5.0–8.0)

## 2022-05-03 MED ORDER — AMOXICILLIN-POT CLAVULANATE 875-125 MG PO TABS: 1.0000 | ORAL_TABLET | Freq: Two times a day (BID) | ORAL | 0 refills | Status: DC

## 2022-05-11 ENCOUNTER — Telehealth: Payer: Self-pay

## 2022-05-11 ENCOUNTER — Other Ambulatory Visit: Payer: Medicare Other

## 2022-05-11 NOTE — Telephone Encounter (Signed)
Nani Skillern called and wanted [place] to do another urine test on her mother's urine. We have a urine sample here.  Do you want [place] to run another dip stick on it? Cordelia Pen said you told  her to bring her back in a week and check urine.  She was here on April 9th.  Thanks, [id number]

## 2022-05-12 ENCOUNTER — Encounter: Payer: Self-pay | Admitting: Nurse Practitioner

## 2022-05-12 LAB — BASIC METABOLIC PANEL
BUN/Creatinine Ratio: 19 (ref 12–28)
BUN: 16 mg/dL (ref 10–36)
CO2: 23 mmol/L (ref 20–29)
Calcium: 9.2 mg/dL (ref 8.7–10.3)
Chloride: 96 mmol/L (ref 96–106)
Creatinine, Ser: 0.83 mg/dL (ref 0.57–1.00)
Glucose: 85 mg/dL (ref 70–99)
Potassium: 5.3 mmol/L — ABNORMAL HIGH (ref 3.5–5.2)
Sodium: 134 mmol/L (ref 134–144)
eGFR: 67 mL/min/{1.73_m2} (ref >59)

## 2022-05-31 ENCOUNTER — Telehealth: Payer: Self-pay | Admitting: Nurse Practitioner

## 2022-05-31 ENCOUNTER — Ambulatory Visit (INDEPENDENT_AMBULATORY_CARE_PROVIDER_SITE_OTHER): Payer: Medicare Other | Admitting: Nurse Practitioner

## 2022-05-31 DIAGNOSIS — R350 Frequency of micturition: Secondary | ICD-10-CM

## 2022-05-31 LAB — POCT URINALYSIS DIPSTICK
Bilirubin, UA: NEGATIVE
Blood, UA: POSITIVE
Glucose, UA: NEGATIVE
Ketones, UA: NEGATIVE
Nitrite, UA: NEGATIVE
Protein, UA: POSITIVE — AB
Spec Grav, UA: 1.015 (ref 1.010–1.025)
Urobilinogen, UA: 0.2 E.U./dL
pH, UA: 6 (ref 5.0–8.0)

## 2022-05-31 NOTE — Telephone Encounter (Signed)
Patient dropped off urine specimen for UA, worried about a UTI. Erin Hunt is out of office until Thursday. Will you please review the UA results that have been entered and send antibiotic if necessary?   [facility]

## 2022-06-02 NOTE — Progress Notes (Signed)
Pt's daughter called after hours on Tuesday, asking for antibiotic to be called in that night.  And I also called her back yesterday regarding the results.  Thanks!

## 2022-06-06 ENCOUNTER — Other Ambulatory Visit: Payer: Self-pay | Admitting: Nurse Practitioner

## 2022-06-12 ENCOUNTER — Other Ambulatory Visit: Payer: Self-pay | Admitting: Internal Medicine

## 2022-06-12 DIAGNOSIS — R350 Frequency of micturition: Secondary | ICD-10-CM

## 2022-06-13 ENCOUNTER — Ambulatory Visit: Payer: Medicare Other

## 2022-06-13 DIAGNOSIS — R350 Frequency of micturition: Secondary | ICD-10-CM

## 2022-06-13 LAB — POCT URINALYSIS DIPSTICK
Blood, UA: NEGATIVE
Glucose, UA: NEGATIVE
Ketones, UA: NEGATIVE
Nitrite, UA: POSITIVE
Protein, UA: NEGATIVE
Spec Grav, UA: 1.01 (ref 1.010–1.025)
Urobilinogen, UA: 1 E.U./dL
pH, UA: 7 (ref 5.0–8.0)

## 2022-06-14 ENCOUNTER — Other Ambulatory Visit: Payer: Self-pay | Admitting: Internal Medicine

## 2022-06-14 MED ORDER — NITROFURANTOIN MONOHYD MACRO 100 MG PO CAPS: 100.0000 mg | ORAL_CAPSULE | Freq: Two times a day (BID) | ORAL | 0 refills | Status: DC

## 2022-06-17 ENCOUNTER — Other Ambulatory Visit: Payer: Self-pay | Admitting: Nurse Practitioner

## 2022-06-17 ENCOUNTER — Encounter: Payer: Self-pay | Admitting: Nurse Practitioner

## 2022-06-17 MED ORDER — CIPROFLOXACIN HCL 500 MG PO TABS: 500.0000 mg | ORAL_TABLET | Freq: Two times a day (BID) | ORAL | 0 refills | Status: AC

## 2022-06-21 ENCOUNTER — Other Ambulatory Visit: Payer: Self-pay | Admitting: Internal Medicine

## 2022-06-23 ENCOUNTER — Other Ambulatory Visit: Payer: Self-pay | Admitting: Nurse Practitioner

## 2022-06-23 ENCOUNTER — Encounter: Payer: Self-pay | Admitting: Nurse Practitioner

## 2022-06-23 MED ORDER — AMOXICILLIN-POT CLAVULANATE 250-62.5 MG/5ML PO SUSR: 250.0000 mg | Freq: Every day | ORAL | 2 refills | Status: DC

## 2022-06-27 ENCOUNTER — Encounter: Payer: Self-pay | Admitting: Nurse Practitioner

## 2022-06-27 ENCOUNTER — Other Ambulatory Visit: Payer: Self-pay | Admitting: Internal Medicine

## 2022-06-28 ENCOUNTER — Other Ambulatory Visit: Payer: Self-pay

## 2022-06-28 MED ORDER — AMOXICILLIN-POT CLAVULANATE 250-62.5 MG/5ML PO SUSR: 5.0000 mL | Freq: Every day | ORAL | 0 refills | Status: DC

## 2022-06-29 ENCOUNTER — Ambulatory Visit: Payer: Medicare Other | Attending: Cardiology | Admitting: Cardiology

## 2022-06-29 ENCOUNTER — Encounter: Payer: Self-pay | Admitting: Cardiology

## 2022-06-29 VITALS — BP 162/74 | HR 71 | Ht 63.0 in | Wt 129.0 lb

## 2022-06-29 DIAGNOSIS — I10 Essential (primary) hypertension: Secondary | ICD-10-CM | POA: Diagnosis present

## 2022-06-29 DIAGNOSIS — I350 Nonrheumatic aortic (valve) stenosis: Secondary | ICD-10-CM | POA: Insufficient documentation

## 2022-06-29 DIAGNOSIS — I951 Orthostatic hypotension: Secondary | ICD-10-CM | POA: Diagnosis present

## 2022-06-29 NOTE — Progress Notes (Signed)
Cardiology Office Note Date:  06/29/2022  Patient ID:  Erin Hunt, Hunt 09-21-1928, MRN [id number] PCP:  Orson Eva, NP  Cardiologist:  Sherryl Manges, MD Electrophysiologist: None   Chief Complaint: 1 year follow-up  History of Present Illness: Erin Hunt Hunt is a 89 y.o. female with PMH notable for HTN, orthostatic hypotension, NICM, LBBB; seen today for Dr. Graciela Husbands for routine electrophysiology followup.  Last saw Dr. Graciela Husbands 04/2021, was continuing to have hypertension in office (normal at home by report) but lightheadedness with prolonged standing. Dropped 30 points with standing in office.  Discussed compression.  Recommended updated echo  She is doing well today.  Main complaint is ongoing recurrent UTIs.  Her blood pressures at home typically run in the 130s to 140s range.   She has some unsteadiness with her gait.  She is working with physical therapy to improve this.  Uses a cane now.  Has made other household accommodations for safety. She tried to use an abdominal binder but found it very uncomfortable.  She has had no further episodes of presyncope or syncope while walking in the kitchen.  She is concerned about her bilateral ankle edema.  She has no edema when she wakes up in the morning, but notices throughout the day swelling increases.    she denies chest pain, palpitations, dyspnea, PND, orthopnea, nausea, vomiting, weight gain, or early satiety.     Past Medical History:  Diagnosis Date   Allergic rhinitis    Anemia    Aortic stenosis    Arthritis    Carpal tunnel syndrome    Cervical spine degeneration    Coronary artery calcification seen on CT scan    a. 12/2011 CT: dense coronary Ca2+; b. 06/2015 MV: EF 59%, basal infsept, mid infsept, apical spet defect, likely artifact 2/2 LBBB. No ischemia.   Diastolic dysfunction    a. 05/2013 Echo: EF 45-50%, Gr1 DD, mild to mod MR, mild AI. Mean grad . Mild MR, mildly dil LA.   Diverticulosis    Essential  hypertension    Fibromyalgia    GERD (gastroesophageal reflux disease)    IBS (irritable bowel syndrome)    IgG gammopathy    LBBB (left bundle branch block)    Orthostatic hypotension    PONV (postoperative nausea and vomiting)    Vitamin B12 deficiency     Past Surgical History:  Procedure Laterality Date   BREAST EXCISIONAL BIOPSY     BREAST LUMPECTOMY WITH RADIOACTIVE SEED LOCALIZATION Left 06/25/2014   Procedure: LEFT BREAST LUMPECTOMY WITH RADIOACTIVE SEED LOCALIZATION;  Surgeon: Abigail Miyamoto, MD;  Location: [facility];  Service: General;  Laterality: Left;   CARDIAC CATHETERIZATION     CHOLECYSTECTOMY     TSA     VAGINAL HYSTERECTOMY     L ovary removed   VESICOVAGINAL FISTULA CLOSURE W/ TAH      Current Outpatient Medications  Medication Instructions   amoxicillin-clavulanate (AUGMENTIN) 250-62.5 MG/5ML suspension 5 mLs, Oral, Daily   buPROPion (WELLBUTRIN XL) 150 MG 24 hr tablet TAKE 1 TABLET BY MOUTH ONCE DAILY ONCE EVERY MORNING   celecoxib (CELEBREX) 200 mg, Oral, As needed   Cholecalciferol (VITAMIN D PO) 1 tablet, Daily   cyclobenzaprine (FLEXERIL) 10 mg, Oral, As needed   cycloSPORINE (RESTASIS) 0.05 % ophthalmic emulsion 1 drop, 2 times daily   dicyclomine (BENTYL) 10 mg, Oral, 3 times daily   esomeprazole (NEXIUM) 20 mg, Oral, Daily   estradiol (ESTRACE) 1 MG tablet TAKE 1  TABLET BY MOUTH ONCE DAILY *DOSE INCREASED*   gabapentin (NEURONTIN) 100 mg, Oral, Daily at bedtime   Gemtesa 75 mg, Oral, Daily   hydrALAZINE (APRESOLINE) 25 MG tablet TAKE 1 TABLET BY MOUTH TWICE DAILY AT NOON AND AT BEDTIME   hyoscyamine (LEVSIN SL) 0.125 MG SL tablet Take 2 tablets by mouth twice daily as needed for stomach cramps   ipratropium (ATROVENT) 0.06 % nasal spray Each Nare   lidocaine (XYLOCAINE) 2 % solution SMARTSIG:By Mouth   magnesium gluconate (MAGONATE) 500 mg, Oral, Daily   Melatonin 1 MG TABS Oral, Use as needed per bottle for insomnia     methenamine (HIPREX) 1 g tablet TAKE 1 TABLET BY MOUTH TWICE DAILY WITH A MEAL   metoprolol tartrate (LOPRESSOR) 25 MG tablet TAKE 1 TABLET BY MOUTH TWICE DAILY   Multiple Vitamin (MULTIVITAMIN) tablet 1 tablet, Oral, Daily   nystatin cream (MYCOSTATIN) Topical, 3 times daily   Probiotic Product (ALIGN PO) 1 tablet, Oral, Daily at bedtime    Social History:  The patient  reports that she has never smoked. She has never been exposed to tobacco smoke. She has never used smokeless tobacco. She reports that she does not drink alcohol and does not use drugs.   Family History: The patient's family history includes Breast cancer in her paternal aunt; Coronary artery disease in her father; Diverticulosis in her mother; Heart attack in her brother; Heart disease in her paternal aunt and paternal uncle; Hypertension in her brother; Leukemia in her brother; Prostate cancer in her brother; Stroke in her brother.  ROS:  Please see the history of present illness. All other systems are reviewed and otherwise negative.   PHYSICAL EXAM:  Vitals:   06/29/22 1438 06/29/22 1506  BP: (!) 160/60 (!) 162/74  Pulse: 71   Height: 5\' 3"  (1.6 m)   Weight: 129 lb (58.5 kg)   BMI (Calculated): 22.86      GEN- The patient is well appearing, alert and oriented x 3 today.   Lungs- Clear to ausculation bilaterally, normal work of breathing.  Heart- Regular rate and rhythm, no murmurs, rubs or gallops Extremities- 1+ pedal edema, warm, dry   EKG is ordered. Personal review of EKG from today shows: Normal sinus rhythm, rate 71.  Left bundle branch block, poor R wave progression  Recent Labs: 03/10/2022: TSH 4.750 04/27/2022: ALT 59; Hemoglobin 13.1 05/11/2022: BUN 16; Creatinine, Ser 0.83; Potassium 5.3; Sodium 134  03/10/2022: Chol/HDL Ratio 3.4; Cholesterol, Total 233; HDL 69; LDL Chol Calc (NIH) 144; Triglycerides 113   CrCl cannot be calculated (Patient's most recent lab result is older than the maximum 21 days  allowed.).   Wt Readings from Last 3 Encounters:  04/28/22 130 lb 6.4 oz (59.1 kg)  04/27/22 127 lb 12.8 oz (58 kg)  03/10/22 126 lb (57.2 kg)     Additional studies reviewed include: Previous EP, cardiology notes.   TTE, 05/28/2021  1. Left ventricular ejection fraction, by estimation, is 50 to 55%. The left ventricle has low normal function. The left ventricle demonstrates regional wall motion abnormalities (mild hypokinesis of the septal wall possibly secondary to condution abnormality/bundle branch block). Left ventricular diastolic parameters are consistent with Grade II diastolic dysfunction (pseudonormalization).   2. Right ventricular systolic function is normal. The right ventricular size is normal.   3. The mitral valve is normal in structure. Mild to moderate mitral valve regurgitation. No evidence of mitral stenosis.   4. The aortic valve is normal in structure.  There is moderate calcification of the aortic valve. Aortic valve regurgitation is mild. Mild aortic valve stenosis. Aortic valve mean gradient measures 18.0 mmHg. Aortic valve Vmax measures 2.43 m/s.   5. The inferior vena cava is normal in size with greater than 50% respiratory variability, suggesting right atrial pressure of 3 mmHg.   6. Challenging images, definity used.   Comparison(s): LVEF 55-60%.    ASSESSMENT AND PLAN:  #) HTN #) Orthostatic Hypotension Elevated in the office today on recheck Well-controlled on home readings Continue hydralazine 25 mg twice per day. Continue Lopressor 25 mg twice daily   #) Aortic Stenosis Stable by most recent echo Continue to monitor, update echo at next visit   #) pedal edema 1+ pedal edema at the end of the day.   Patient states that when she wakes in the morning her ankles are not swollen. Last echo showed preserved ejection fraction. Recommended compression socks and extremity elevation.   Current medicines are reviewed at length with the patient today.    The patient does not have concerns regarding her medicines.  The following changes were made today:  none  Labs/ tests ordered today include:  No orders of the defined types were placed in this encounter.    Disposition: Follow up with Dr. Graciela Husbands or EP APP in 12 months   Signed, Sherie Don, NP  06/29/22  8:41 AM  Electrophysiology [facility]

## 2022-06-29 NOTE — Patient Instructions (Signed)
Medication Instructions:   Your physician recommends that you continue on your current medications as directed. Please refer to the Current Medication list given to you today.  *If you need a refill on your cardiac medications before your next appointment, please call your pharmacy*   Lab Work:  No lab ordered today.  If you have labs (blood work) drawn today and your tests are completely normal, you will receive your results only by: MyChart Message (if you have MyChart) OR A paper copy in the mail If you have any lab test that is abnormal or we need to change your treatment, we will call you to review the results.   Testing/Procedures:  No testing ordered today.   Follow-Up: At [facility], you and your health needs are our priority.  As part of our continuing mission to provide you with exceptional heart care, we have created designated Provider Care Teams.  These Care Teams include your primary Cardiologist (physician) and Advanced Practice Providers (APPs -  Physician Assistants and Nurse Practitioners) who all work together to provide you with the care you need, when you need it.  We recommend signing up for the patient portal called "MyChart".  Sign up information is provided on this After Visit Summary.  MyChart is used to connect with patients for Virtual Visits (Telemedicine).  Patients are able to view lab/test results, encounter notes, upcoming appointments, etc.  Non-urgent messages can be sent to your provider as well.   To learn more about what you can do with MyChart, go to [email or web address].    Your next appointment:   12 month(s)  Provider:   Sherryl Manges, MD  or Sherie Don, NP

## 2022-07-04 ENCOUNTER — Encounter: Payer: Self-pay | Admitting: Nurse Practitioner

## 2022-07-07 ENCOUNTER — Other Ambulatory Visit: Payer: Self-pay | Admitting: Nurse Practitioner

## 2022-07-11 ENCOUNTER — Other Ambulatory Visit: Payer: Self-pay | Admitting: Urology

## 2022-07-11 ENCOUNTER — Other Ambulatory Visit: Payer: Self-pay | Admitting: Nurse Practitioner

## 2022-07-29 ENCOUNTER — Other Ambulatory Visit: Payer: Self-pay | Admitting: Internal Medicine

## 2022-08-01 ENCOUNTER — Other Ambulatory Visit: Payer: Self-pay | Admitting: Internal Medicine

## 2022-08-02 ENCOUNTER — Encounter: Payer: Self-pay | Admitting: Nurse Practitioner

## 2022-08-03 ENCOUNTER — Telehealth: Payer: Self-pay | Admitting: Nurse Practitioner

## 2022-08-03 DIAGNOSIS — R3915 Urgency of urination: Secondary | ICD-10-CM | POA: Diagnosis not present

## 2022-08-03 LAB — POCT URINALYSIS DIPSTICK
Bilirubin, UA: NEGATIVE
Blood, UA: NEGATIVE
Glucose, UA: NEGATIVE
Ketones, UA: NEGATIVE
Leukocytes, UA: NEGATIVE
Nitrite, UA: NEGATIVE
Protein, UA: NEGATIVE
Spec Grav, UA: 1.01 (ref 1.010–1.025)
Urobilinogen, UA: 0.2 E.U./dL
pH, UA: 6 (ref 5.0–8.0)

## 2022-08-03 NOTE — Telephone Encounter (Signed)
Patient's daughter, Cordelia Pen, left VM asking for a refill of augmentin for patient. Chelsa has her on this for UTI prevention. Please send to [facility].

## 2022-08-03 NOTE — Telephone Encounter (Signed)
Erin Hunt called reporting that patient is having increased fatigue, generalized soreness and body aches, increased urge to urinate, and having more difficulty walking. She believes that she may have a UTI. Deri Fuelling that one of the daughters needs to bring by a urine specimen.

## 2022-08-04 ENCOUNTER — Encounter: Payer: Self-pay | Admitting: Nurse Practitioner

## 2022-08-04 ENCOUNTER — Encounter: Payer: Self-pay | Admitting: Internal Medicine

## 2022-08-04 ENCOUNTER — Other Ambulatory Visit: Payer: Self-pay | Admitting: Family

## 2022-08-04 ENCOUNTER — Other Ambulatory Visit: Payer: Self-pay | Admitting: Internal Medicine

## 2022-08-04 DIAGNOSIS — N390 Urinary tract infection, site not specified: Secondary | ICD-10-CM

## 2022-08-04 MED ORDER — AMOXICILLIN-POT CLAVULANATE 250-62.5 MG/5ML PO SUSR: 5.0000 mL | Freq: Every day | ORAL | 0 refills | Status: DC

## 2022-08-04 MED ORDER — AMOXICILLIN-POT CLAVULANATE 250-62.5 MG/5ML PO SUSR: 5.0000 mL | Freq: Every day | ORAL | 2 refills | Status: DC

## 2022-08-04 NOTE — Addendum Note (Signed)
Addended by: Grayling Congress on: 08/04/2022 11:22 AM   Modules accepted: Orders

## 2022-08-05 LAB — URINALYSIS, ROUTINE W REFLEX MICROSCOPIC
Bilirubin, UA: NEGATIVE
Glucose, UA: NEGATIVE
Ketones, UA: NEGATIVE
Leukocytes,UA: NEGATIVE
Nitrite, UA: NEGATIVE
RBC, UA: NEGATIVE
Specific Gravity, UA: 1.011 (ref 1.005–1.030)
Urobilinogen, Ur: 0.2 mg/dL (ref 0.2–1.0)
pH, UA: 6.5 (ref 5.0–7.5)

## 2022-08-08 ENCOUNTER — Encounter: Payer: Self-pay | Admitting: Internal Medicine

## 2022-08-08 LAB — URINE CULTURE

## 2022-08-16 ENCOUNTER — Other Ambulatory Visit: Payer: Self-pay

## 2022-08-24 ENCOUNTER — Other Ambulatory Visit: Payer: Self-pay

## 2022-08-24 MED ORDER — CELECOXIB 200 MG PO CAPS: 200.0000 mg | ORAL_CAPSULE | ORAL | 0 refills | Status: DC | PRN

## 2022-08-24 MED ORDER — HYOSCYAMINE SULFATE 0.125 MG SL SUBL: 0.1250 mg | SUBLINGUAL_TABLET | SUBLINGUAL | 0 refills | Status: DC | PRN

## 2022-09-12 ENCOUNTER — Other Ambulatory Visit: Payer: Self-pay | Admitting: Internal Medicine

## 2022-09-13 ENCOUNTER — Other Ambulatory Visit: Payer: Self-pay | Admitting: Internal Medicine

## 2022-09-13 DIAGNOSIS — N390 Urinary tract infection, site not specified: Secondary | ICD-10-CM

## 2022-09-13 MED ORDER — AMOXICILLIN-POT CLAVULANATE 250-62.5 MG/5ML PO SUSR
5.0000 mL | Freq: Every day | ORAL | 2 refills | Status: AC
Start: 2022-09-13 — End: ?

## 2022-09-28 ENCOUNTER — Ambulatory Visit (INDEPENDENT_AMBULATORY_CARE_PROVIDER_SITE_OTHER): Payer: Medicare Other | Admitting: Family

## 2022-09-28 ENCOUNTER — Telehealth: Payer: Self-pay | Admitting: Internal Medicine

## 2022-09-28 ENCOUNTER — Telehealth: Payer: Self-pay

## 2022-09-28 DIAGNOSIS — N390 Urinary tract infection, site not specified: Secondary | ICD-10-CM | POA: Diagnosis not present

## 2022-09-28 LAB — POCT URINALYSIS DIPSTICK
Bilirubin, UA: NEGATIVE
Blood, UA: NEGATIVE
Glucose, UA: NEGATIVE
Ketones, UA: NEGATIVE
Leukocytes, UA: NEGATIVE
Nitrite, UA: NEGATIVE
Protein, UA: POSITIVE — AB
Spec Grav, UA: 1.01 (ref 1.010–1.025)
Urobilinogen, UA: 0.2 U/dL
pH, UA: 7 (ref 5.0–8.0)

## 2022-09-28 NOTE — Telephone Encounter (Signed)
Pt daughter states that her sister will be bringing the pt's UA just to make sure she doesn't have a UTI

## 2022-09-29 NOTE — Telephone Encounter (Signed)
UTI was brought in and tested.

## 2022-10-01 NOTE — Progress Notes (Signed)
   CHIEF COMPLAINT  UA/ only visit fot UTI     REASON FOR VISIT  Possible UTI, UA Visit Only      ASSESSMENT & PLAN Diagnoses and all orders for this visit:  Recurrent UTI -     POCT Urinalysis Dipstick ([id number])     Patient notified.  Total time spent: 5 minutes  Miki Kins, FNP 09/28/2022

## 2022-10-03 ENCOUNTER — Other Ambulatory Visit: Payer: Self-pay | Admitting: Urology

## 2022-10-03 ENCOUNTER — Other Ambulatory Visit: Payer: Self-pay | Admitting: Family

## 2022-10-26 ENCOUNTER — Other Ambulatory Visit: Payer: Self-pay | Admitting: Internal Medicine

## 2022-10-26 ENCOUNTER — Encounter: Payer: Self-pay | Admitting: Internal Medicine

## 2022-10-26 ENCOUNTER — Telehealth: Payer: Self-pay

## 2022-10-27 ENCOUNTER — Other Ambulatory Visit: Payer: Self-pay | Admitting: Internal Medicine

## 2022-10-27 DIAGNOSIS — N390 Urinary tract infection, site not specified: Secondary | ICD-10-CM

## 2022-10-27 MED ORDER — AMOXICILLIN-POT CLAVULANATE 250-62.5 MG/5ML PO SUSR
5.0000 mL | Freq: Every day | ORAL | 2 refills | Status: DC
Start: 2022-10-27 — End: 2022-12-09

## 2022-11-01 ENCOUNTER — Ambulatory Visit (INDEPENDENT_AMBULATORY_CARE_PROVIDER_SITE_OTHER): Payer: Medicare Other | Admitting: Internal Medicine

## 2022-11-01 ENCOUNTER — Encounter: Payer: Self-pay | Admitting: Internal Medicine

## 2022-11-01 VITALS — BP 130/75 | HR 66 | Ht 63.0 in | Wt 127.4 lb

## 2022-11-01 DIAGNOSIS — R5383 Other fatigue: Secondary | ICD-10-CM

## 2022-11-01 DIAGNOSIS — J301 Allergic rhinitis due to pollen: Secondary | ICD-10-CM

## 2022-11-01 DIAGNOSIS — N390 Urinary tract infection, site not specified: Secondary | ICD-10-CM

## 2022-11-01 DIAGNOSIS — E782 Mixed hyperlipidemia: Secondary | ICD-10-CM

## 2022-11-01 DIAGNOSIS — K219 Gastro-esophageal reflux disease without esophagitis: Secondary | ICD-10-CM

## 2022-11-01 LAB — POCT URINALYSIS DIPSTICK
Bilirubin, UA: NEGATIVE
Blood, UA: NEGATIVE
Glucose, UA: NEGATIVE
Ketones, UA: NEGATIVE
Nitrite, UA: NEGATIVE
Protein, UA: POSITIVE — AB
Spec Grav, UA: 1.015 (ref 1.010–1.025)
Urobilinogen, UA: 0.2 U/dL
pH, UA: 6 (ref 5.0–8.0)

## 2022-11-01 NOTE — Progress Notes (Signed)
Established Patient Office Visit  Subjective:  Patient ID: Erin Hunt, female    DOB: 1928-04-13  Age: 89 y.o. MRN: [id number]  Chief Complaint  Patient presents with   Follow-up    referral    Patient is here accompanied by her daughter.  Reports of feeling tired and sleepy during daytime. Would like to get her labs checked.  She has history of recurrent urinary tract infections and although she is on prophylaxis Augmentin will check her urine, urine specimen today. Patient is on several medications which can cause sedation as a side effect eluding recently started gabapentin for itchy scalp. Patient understands the need to lie down and take a nap when she is sleepy as she is benefiting from her medications.    No other concerns at this time.   Past Medical History:  Diagnosis Date   Allergic rhinitis    Anemia    Aortic stenosis    Arthritis    Carpal tunnel syndrome    Cervical spine degeneration    Coronary artery calcification seen on CT scan    a. 12/2011 CT: dense coronary Ca2+; b. 06/2015 MV: EF 59%, basal infsept, mid infsept, apical spet defect, likely artifact 2/2 LBBB. No ischemia.   Diastolic dysfunction    a. 05/2013 Echo: EF 45-50%, Gr1 DD, mild to mod MR, mild AI. Mean grad . Mild MR, mildly dil LA.   Diverticulosis    Essential hypertension    Fibromyalgia    GERD (gastroesophageal reflux disease)    IBS (irritable bowel syndrome)    IgG gammopathy    LBBB (left bundle branch block)    Orthostatic hypotension    PONV (postoperative nausea and vomiting)    Vitamin B12 deficiency     Past Surgical History:  Procedure Laterality Date   BREAST EXCISIONAL BIOPSY     BREAST LUMPECTOMY WITH RADIOACTIVE SEED LOCALIZATION Left 06/25/2014   Procedure: LEFT BREAST LUMPECTOMY WITH RADIOACTIVE SEED LOCALIZATION;  Surgeon: Abigail Miyamoto, MD;  Location: [facility];  Service: General;  Laterality: Left;   CARDIAC CATHETERIZATION      CHOLECYSTECTOMY     TSA     VAGINAL HYSTERECTOMY     L ovary removed   VESICOVAGINAL FISTULA CLOSURE W/ TAH      Social History   Socioeconomic History   Marital status: Widowed    Spouse name: Not on file   Number of children: Not on file   Years of education: Not on file   Highest education level: Not on file  Occupational History   Occupation: Retired    Associate Professor: RETIRED    Comment: Airline pilot  Tobacco Use   Smoking status: Never    Passive exposure: Never   Smokeless tobacco: Never  Substance and Sexual Activity   Alcohol use: No   Drug use: No   Sexual activity: Not Currently  Other Topics Concern   Not on file  Social History Narrative   Not on file   Social Determinants of Health   Financial Resource Strain: Not on file  Food Insecurity: Not on file  Transportation Needs: Not on file  Physical Activity: Not on file  Stress: Not on file  Social Connections: Not on file  Intimate Partner Violence: Not on file    Family History  Problem Relation Age of Onset   Diverticulosis Mother    Coronary artery disease Father    Heart disease Paternal Aunt    Heart disease Paternal Uncle  Breast cancer Paternal Aunt    Prostate cancer Brother    Leukemia Brother    Heart attack Brother    Hypertension Brother    Stroke Brother     Allergies  Allergen Reactions   Angiotensin Receptor Blockers Other (See Comments)    hyperkalemia   Aspirin Other (See Comments)    Ecchymosis with high doses   Codeine Sulfate Nausea Only   Donepezil     Other reaction(s): Other (See Comments) Could not tolerate   Erythromycin Nausea And Vomiting   Sulfa Antibiotics Nausea Only   Sulfonamide Derivatives Nausea Only    Review of Systems  Constitutional:  Positive for malaise/fatigue. Negative for chills, diaphoresis, fever and weight loss.  HENT:  Positive for congestion.   Eyes: Negative.   Respiratory: Negative.  Negative for cough and shortness of breath.    Cardiovascular: Negative.  Negative for chest pain, palpitations and leg swelling.  Gastrointestinal: Negative.  Negative for abdominal pain, constipation, diarrhea, heartburn, nausea and vomiting.  Genitourinary: Negative.  Negative for dysuria and flank pain.  Musculoskeletal: Negative.  Negative for joint pain and myalgias.  Skin: Negative.   Neurological: Negative.  Negative for dizziness and headaches.  Endo/Heme/Allergies: Negative.   Psychiatric/Behavioral: Negative.  Negative for depression and suicidal ideas. The patient is not nervous/anxious.      Objective:   BP 130/75   Pulse 66   Ht 5\' 3"  (1.6 m)   Wt 127 lb 6.4 oz (57.8 kg)   SpO2 98%   BMI 22.57 kg/m   Vitals:   11/01/22 1433  BP: 130/75  Pulse: 66  Height: 5\' 3"  (1.6 m)  Weight: 127 lb 6.4 oz (57.8 kg)  SpO2: 98%  BMI (Calculated): 22.57    Physical Exam Vitals and nursing note reviewed.  Constitutional:      Appearance: Normal appearance.  HENT:     Head: Normocephalic and atraumatic.     Nose: Nose normal.     Mouth/Throat:     Mouth: Mucous membranes are moist.     Pharynx: Oropharynx is clear.  Eyes:     Conjunctiva/sclera: Conjunctivae normal.     Pupils: Pupils are equal, round, and reactive to light.  Cardiovascular:     Rate and Rhythm: Normal rate and regular rhythm.     Pulses: Normal pulses.     Heart sounds: Normal heart sounds. No murmur heard. Pulmonary:     Effort: Pulmonary effort is normal.     Breath sounds: Normal breath sounds. No wheezing.  Abdominal:     General: Bowel sounds are normal.     Palpations: Abdomen is soft.     Tenderness: There is no abdominal tenderness. There is no right CVA tenderness or left CVA tenderness.  Musculoskeletal:        General: Normal range of motion.     Cervical back: Normal range of motion.     Right lower leg: No edema.     Left lower leg: No edema.  Skin:    General: Skin is warm and dry.  Neurological:     General: No focal  deficit present.     Mental Status: She is alert and oriented to person, place, and time.  Psychiatric:        Mood and Affect: Mood normal.        Behavior: Behavior normal.      Results for orders placed or performed in visit on 11/01/22  POCT Urinalysis Dipstick ([id number])  Result Value  Ref Range   Color, UA yellow    Clarity, UA clear    Glucose, UA Negative Negative   Bilirubin, UA neg    Ketones, UA neg    Spec Grav, UA 1.015 1.010 - 1.025   Blood, UA neg    pH, UA 6.0 5.0 - 8.0   Protein, UA Positive (A) Negative   Urobilinogen, UA 0.2 0.2 or 1.0 E.U./dL   Nitrite, UA neg    Leukocytes, UA Small (1+) (A) Negative   Appearance clear    Odor yes     Recent Results (from the past 2160 hour(s))  Urine Culture     Status: Abnormal   Collection Time: 08/04/22  1:30 PM   Specimen: Urine, Clean Catch   UC  Result Value Ref Range   Urine Culture, Routine Final report (A)    Organism ID, Bacteria Escherichia coli (A)     Comment: Cefazolin <=4 ug/mL Cefazolin with an MIC <=16 predicts susceptibility to the oral agents cefaclor, cefdinir, cefpodoxime, cefprozil, cefuroxime, cephalexin, and loracarbef when used for therapy of uncomplicated urinary tract infections due to E. coli, Klebsiella pneumoniae, and Proteus mirabilis. Greater than 100,000 colony forming units per mL    Antimicrobial Susceptibility Comment     Comment:       ** S = Susceptible; I = Intermediate; R = Resistant **                    P = Positive; N = Negative             MICS are expressed in micrograms per mL    Antibiotic                 RSLT#1    RSLT#2    RSLT#3    RSLT#4 Amoxicillin/Clavulanic Acid    S Ampicillin                     S Cefepime                       S Ceftriaxone                    S Cefuroxime                     S Ciprofloxacin                  S Ertapenem                      S Gentamicin                     S Imipenem                       S Levofloxacin                    S Meropenem                      S Nitrofurantoin                 S Piperacillin/Tazobactam        S Tetracycline                   S Tobramycin  S Trimethoprim/Sulfa             S   Urinalysis, Routine w reflex microscopic     Status: None   Collection Time: 08/04/22  1:33 PM  Result Value Ref Range   Specific Gravity, UA 1.011 1.005 - 1.030   pH, UA 6.5 5.0 - 7.5   Color, UA Yellow Yellow   Appearance Ur Clear Clear   Leukocytes,UA Negative Negative   Protein,UA Trace Negative/Trace   Glucose, UA Negative Negative   Ketones, UA Negative Negative   RBC, UA Negative Negative   Bilirubin, UA Negative Negative   Urobilinogen, Ur 0.2 0.2 - 1.0 mg/dL   Nitrite, UA Negative Negative   Microscopic Examination Comment     Comment: Microscopic not indicated and not performed.  POCT Urinalysis Dipstick ([id number])     Status: Abnormal   Collection Time: 09/28/22 12:49 PM  Result Value Ref Range   Color, UA yellow    Clarity, UA clear    Glucose, UA Negative Negative   Bilirubin, UA neg    Ketones, UA neg    Spec Grav, UA 1.010 1.010 - 1.025   Blood, UA neg    pH, UA 7.0 5.0 - 8.0   Protein, UA Positive (A) Negative   Urobilinogen, UA 0.2 0.2 or 1.0 E.U./dL   Nitrite, UA neg    Leukocytes, UA Negative Negative   Appearance clear    Odor yes   POCT Urinalysis Dipstick ([id number])     Status: Abnormal   Collection Time: 11/01/22  3:57 PM  Result Value Ref Range   Color, UA yellow    Clarity, UA clear    Glucose, UA Negative Negative   Bilirubin, UA neg    Ketones, UA neg    Spec Grav, UA 1.015 1.010 - 1.025   Blood, UA neg    pH, UA 6.0 5.0 - 8.0   Protein, UA Positive (A) Negative   Urobilinogen, UA 0.2 0.2 or 1.0 E.U./dL   Nitrite, UA neg    Leukocytes, UA Small (1+) (A) Negative   Appearance clear    Odor yes       Assessment & Plan:  Check labs. Urine culture. Rest and fluids advised. Problem List Items Addressed This Visit     Hyperlipidemia    Relevant Orders   CMP14+EGFR   Lipid Panel w/o Chol/HDL Ratio   Allergic rhinitis   Gastroesophageal reflux disease   Other fatigue - Primary   Relevant Orders   CBC with Diff   TSH+T4F+T3Free   Other Visit Diagnoses     Recurrent UTI       Relevant Orders   POCT Urinalysis Dipstick ([id number]) (Completed)   Urine Culture       Follow up as needed.  Total time spent: 30 minutes  Margaretann Loveless, MD  11/01/2022   This document may have been prepared by [facility] Voice Recognition software and as such may include unintentional dictation errors.

## 2022-11-02 LAB — CBC WITH DIFFERENTIAL/PLATELET
Basophils Absolute: 0 10*3/uL (ref 0.0–0.2)
Basos: 1 %
EOS (ABSOLUTE): 0.2 10*3/uL (ref 0.0–0.4)
Eos: 3 %
Hematocrit: 40.5 % (ref 34.0–46.6)
Hemoglobin: 12.8 g/dL (ref 11.1–15.9)
Immature Grans (Abs): 0 10*3/uL (ref 0.0–0.1)
Immature Granulocytes: 0 %
Lymphocytes Absolute: 1.7 10*3/uL (ref 0.7–3.1)
Lymphs: 25 %
MCH: 26.7 pg (ref 26.6–33.0)
MCHC: 31.6 g/dL (ref 31.5–35.7)
MCV: 85 fL (ref 79–97)
Monocytes Absolute: 0.6 10*3/uL (ref 0.1–0.9)
Monocytes: 8 %
Neutrophils Absolute: 4.3 10*3/uL (ref 1.4–7.0)
Neutrophils: 63 %
Platelets: 314 10*3/uL (ref 150–450)
RBC: 4.79 x10E6/uL (ref 3.77–5.28)
RDW: 13.9 % (ref 11.7–15.4)
WBC: 6.9 10*3/uL (ref 3.4–10.8)

## 2022-11-02 LAB — CMP14+EGFR
ALT: 40 [IU]/L — ABNORMAL HIGH (ref 0–32)
AST: 50 [IU]/L — ABNORMAL HIGH (ref 0–40)
Albumin: 4.1 g/dL (ref 3.6–4.6)
Alkaline Phosphatase: 116 [IU]/L (ref 44–121)
BUN/Creatinine Ratio: 16 (ref 12–28)
BUN: 15 mg/dL (ref 10–36)
Bilirubin Total: 0.3 mg/dL (ref 0.0–1.2)
CO2: 23 mmol/L (ref 20–29)
Calcium: 9.4 mg/dL (ref 8.7–10.3)
Chloride: 98 mmol/L (ref 96–106)
Creatinine, Ser: 0.91 mg/dL (ref 0.57–1.00)
Globulin, Total: 2.4 g/dL (ref 1.5–4.5)
Glucose: 93 mg/dL (ref 70–99)
Potassium: 5.2 mmol/L (ref 3.5–5.2)
Sodium: 134 mmol/L (ref 134–144)
Total Protein: 6.5 g/dL (ref 6.0–8.5)
eGFR: 59 mL/min/{1.73_m2} — ABNORMAL LOW (ref >59)

## 2022-11-02 LAB — URINE CULTURE

## 2022-11-02 LAB — TSH+T4F+T3FREE
Free T4: 1.23 ng/dL (ref 0.82–1.77)
T3, Free: 2.7 pg/mL (ref 2.0–4.4)
TSH: 3.72 u[IU]/mL (ref 0.450–4.500)

## 2022-11-02 LAB — LIPID PANEL W/O CHOL/HDL RATIO
Cholesterol, Total: 236 mg/dL — ABNORMAL HIGH (ref 100–199)
HDL: 73 mg/dL (ref >39)
LDL Chol Calc (NIH): 139 mg/dL — ABNORMAL HIGH (ref 0–99)
Triglycerides: 136 mg/dL (ref 0–149)
VLDL Cholesterol Cal: 24 mg/dL (ref 5–40)

## 2022-11-22 ENCOUNTER — Other Ambulatory Visit: Payer: Self-pay

## 2022-11-22 MED ORDER — LINZESS 72 MCG PO CAPS: 72.0000 ug | ORAL_CAPSULE | Freq: Every morning | ORAL | 2 refills | Status: DC

## 2022-12-09 ENCOUNTER — Other Ambulatory Visit: Payer: Self-pay | Admitting: Internal Medicine

## 2022-12-09 ENCOUNTER — Encounter: Payer: Self-pay | Admitting: Internal Medicine

## 2022-12-19 ENCOUNTER — Other Ambulatory Visit: Payer: Self-pay | Admitting: Urology

## 2022-12-19 DIAGNOSIS — N3946 Mixed incontinence: Secondary | ICD-10-CM

## 2022-12-29 ENCOUNTER — Other Ambulatory Visit: Payer: Self-pay

## 2022-12-29 ENCOUNTER — Telehealth: Payer: Self-pay | Admitting: Internal Medicine

## 2022-12-29 MED ORDER — DICYCLOMINE HCL 10 MG PO CAPS: 10.0000 mg | ORAL_CAPSULE | Freq: Three times a day (TID) | ORAL | 0 refills | Status: DC

## 2022-12-29 NOTE — Telephone Encounter (Signed)
Misty Stanley, PT with [facility], left VM requesting a recert for PT for balance and gait training - requesting 1w8. Per Neelam verbal these orders are fine. Verbal orders given to [facility].

## 2023-01-03 ENCOUNTER — Other Ambulatory Visit: Payer: Self-pay | Admitting: Family

## 2023-01-10 ENCOUNTER — Other Ambulatory Visit: Payer: Self-pay | Admitting: Internal Medicine

## 2023-01-10 DIAGNOSIS — Z1231 Encounter for screening mammogram for malignant neoplasm of breast: Secondary | ICD-10-CM

## 2023-01-14 ENCOUNTER — Encounter: Payer: Self-pay | Admitting: Internal Medicine

## 2023-01-16 ENCOUNTER — Other Ambulatory Visit: Payer: Self-pay | Admitting: Internal Medicine

## 2023-01-16 ENCOUNTER — Other Ambulatory Visit: Payer: Self-pay | Admitting: Cardiology

## 2023-01-16 DIAGNOSIS — L299 Pruritus, unspecified: Secondary | ICD-10-CM

## 2023-01-16 DIAGNOSIS — F419 Anxiety disorder, unspecified: Secondary | ICD-10-CM

## 2023-01-16 MED ORDER — GABAPENTIN 100 MG PO CAPS
200.0000 mg | ORAL_CAPSULE | Freq: Every day | ORAL | 1 refills | Status: DC
Start: 2023-01-16 — End: 2023-05-23

## 2023-01-16 MED ORDER — HYDROXYZINE HCL 10 MG PO TABS: 10.0000 mg | ORAL_TABLET | Freq: Two times a day (BID) | ORAL | 1 refills | Status: DC | PRN

## 2023-02-07 ENCOUNTER — Ambulatory Visit
Admission: RE | Admit: 2023-02-07 | Discharge: 2023-02-07 | Disposition: A | Discharge status: Home / Self Care | Payer: Medicare Other | Source: Ambulatory Visit | Attending: Internal Medicine | Admitting: Internal Medicine

## 2023-02-07 ENCOUNTER — Other Ambulatory Visit: Payer: Self-pay | Admitting: Internal Medicine

## 2023-02-07 DIAGNOSIS — Z1231 Encounter for screening mammogram for malignant neoplasm of breast: Secondary | ICD-10-CM | POA: Diagnosis present

## 2023-02-07 MED ORDER — CELECOXIB 200 MG PO CAPS: 200.0000 mg | ORAL_CAPSULE | ORAL | 1 refills | Status: DC | PRN

## 2023-02-14 NOTE — Progress Notes (Signed)
Patient notified

## 2023-02-27 ENCOUNTER — Other Ambulatory Visit: Payer: Self-pay | Admitting: Internal Medicine

## 2023-03-20 ENCOUNTER — Other Ambulatory Visit: Payer: Self-pay | Admitting: Internal Medicine

## 2023-04-04 ENCOUNTER — Other Ambulatory Visit: Payer: Self-pay | Admitting: Urology

## 2023-04-04 ENCOUNTER — Other Ambulatory Visit: Payer: Self-pay | Admitting: Internal Medicine

## 2023-04-04 DIAGNOSIS — L299 Pruritus, unspecified: Secondary | ICD-10-CM

## 2023-04-04 DIAGNOSIS — F419 Anxiety disorder, unspecified: Secondary | ICD-10-CM

## 2023-04-07 ENCOUNTER — Telehealth: Payer: Self-pay | Admitting: Urology

## 2023-04-07 NOTE — Telephone Encounter (Signed)
 Incoming call from pharmacy requesting update on refill of Hiprex as they state the patient's daughter is waiting and has been calling them. Please advise on refill.

## 2023-04-07 NOTE — Telephone Encounter (Signed)
 I just refilled her Hiprex, but she will need her yearly follow up in April.

## 2023-04-10 NOTE — Telephone Encounter (Signed)
 Shirley aware.   Appt made.

## 2023-04-17 ENCOUNTER — Other Ambulatory Visit: Payer: Self-pay | Admitting: Cardiology

## 2023-05-11 ENCOUNTER — Other Ambulatory Visit: Payer: Self-pay

## 2023-05-11 MED ORDER — ESOMEPRAZOLE MAGNESIUM 20 MG PO CPDR: 20.0000 mg | DELAYED_RELEASE_CAPSULE | Freq: Every day | ORAL | 1 refills | Status: DC

## 2023-05-18 ENCOUNTER — Other Ambulatory Visit: Payer: Self-pay | Admitting: Internal Medicine

## 2023-05-22 ENCOUNTER — Other Ambulatory Visit: Payer: Self-pay | Admitting: Internal Medicine

## 2023-05-22 DIAGNOSIS — L299 Pruritus, unspecified: Secondary | ICD-10-CM

## 2023-05-22 DIAGNOSIS — F419 Anxiety disorder, unspecified: Secondary | ICD-10-CM

## 2023-05-24 ENCOUNTER — Encounter: Payer: Self-pay | Admitting: Urology

## 2023-05-24 ENCOUNTER — Ambulatory Visit (INDEPENDENT_AMBULATORY_CARE_PROVIDER_SITE_OTHER): Admitting: Urology

## 2023-05-24 VITALS — BP 224/84 | HR 59 | Ht 63.0 in | Wt 126.0 lb

## 2023-05-24 DIAGNOSIS — R3129 Other microscopic hematuria: Secondary | ICD-10-CM

## 2023-05-24 DIAGNOSIS — I10 Essential (primary) hypertension: Secondary | ICD-10-CM | POA: Diagnosis not present

## 2023-05-24 DIAGNOSIS — R3989 Other symptoms and signs involving the genitourinary system: Secondary | ICD-10-CM

## 2023-05-24 DIAGNOSIS — N3946 Mixed incontinence: Secondary | ICD-10-CM | POA: Diagnosis not present

## 2023-05-24 DIAGNOSIS — N390 Urinary tract infection, site not specified: Secondary | ICD-10-CM

## 2023-05-24 LAB — URINALYSIS, COMPLETE
Bilirubin, UA: NEGATIVE
Glucose, UA: NEGATIVE
Ketones, UA: NEGATIVE
Nitrite, UA: NEGATIVE
Specific Gravity, UA: 1.015 (ref 1.005–1.030)
Urobilinogen, Ur: 0.2 mg/dL (ref 0.2–1.0)
pH, UA: 5.5 (ref 5.0–7.5)

## 2023-05-24 LAB — MICROSCOPIC EXAMINATION

## 2023-05-24 LAB — BLADDER SCAN AMB NON-IMAGING

## 2023-05-24 NOTE — Progress Notes (Signed)
 05/24/2023 2:30 PM   Erin Hunt 09/12/1928 [id number]  Referring provider: Aisha Hove, MD [address] [place],  [place] [address]  Urological history: 1.  Recurrent UTIs - cysto (2022) - NED - Less than 10,000 colonies November 01, 2022 - E. coli August 04, 2022 - Klebsiella aerogenes Jun 16, 2022 - Hiprex  1 g twice daily and vaginal estrogen cream  2.  Incontinence - Gemtesa  75 mg daily  3.  Pelvic floor laxity - Pelvic floor physical therapy in the past  Chief Complaint  Patient presents with   Follow-up   HPI: Erin Hunt is a 89 y.o. female who presents today for one year follow up for rUTI's with her daughter, Erin Hunt.    Previous records reviewed.   She states her balance has been off for the last several weeks.  She had diarrhea all last week and she feels weak.  Her and her daughter confirmed that the diarrhea is an ongoing issue for most of her life.  Over the last year she has been having what she describes a hot flash around 3 PM.  She also has been having what she describes as chills on and off for the last several months.  Last week she started to experience a worsening of her urinary urgency.  She had urge incontinence 4-5 times last week.  She also feels hot in her bladder prior to voiding and then after she voids this "hotness" sensation abates.    Today's UA yellow slightly cloudy, specific gravity 1.015, trace heme, pH 5.5, 1+ protein, 1+ leukocyte, 6-10 WBCs, 11-30 RBCs, 0-10 epithelial cells, hyaline cast present, mucus threads present and a few bacteria.    Patient denies any modifying or aggravating factors.  Patient denies any recent UTI's, gross hematuria, dysuria or suprapubic/flank pain.  Patient denies any fevers, nausea or vomiting.    PVR 0 mL  PMH: Past Medical History:  Diagnosis Date   Allergic rhinitis    Anemia    Aortic stenosis    Arthritis    Carpal tunnel syndrome    Cervical spine degeneration    Coronary artery  calcification seen on CT scan    a. 12/2011 CT: dense coronary Ca2+; b. 06/2015 MV: EF 59%, basal infsept, mid infsept, apical spet defect, likely artifact 2/2 LBBB. No ischemia.   Diastolic dysfunction    a. 05/2013 Echo: EF 45-50%, Gr1 DD, mild to mod MR, mild AI. Mean grad . Mild MR, mildly dil LA.   Diverticulosis    Essential hypertension    Fibromyalgia    GERD (gastroesophageal reflux disease)    IBS (irritable bowel syndrome)    IgG gammopathy    LBBB (left bundle branch block)    Orthostatic hypotension    PONV (postoperative nausea and vomiting)    Vitamin B12 deficiency     Surgical History: Past Surgical History:  Procedure Laterality Date   BREAST EXCISIONAL BIOPSY     BREAST LUMPECTOMY WITH RADIOACTIVE SEED LOCALIZATION Left 06/25/2014   Procedure: LEFT BREAST LUMPECTOMY WITH RADIOACTIVE SEED LOCALIZATION;  Surgeon: Oza Blumenthal, MD;  Location: [facility];  Service: General;  Laterality: Left;   CARDIAC CATHETERIZATION     CHOLECYSTECTOMY     TSA     VAGINAL HYSTERECTOMY     L ovary removed   VESICOVAGINAL FISTULA CLOSURE W/ TAH      Home Medications:  Allergies as of 05/24/2023       Reactions   Angiotensin  Receptor Blockers Other (See Comments)   hyperkalemia   Aspirin  Other (See Comments)   Ecchymosis with high doses   Codeine Sulfate Nausea Only   Donepezil    Other reaction(s): Other (See Comments) Could not tolerate   Erythromycin Nausea And Vomiting   Sulfa Antibiotics Nausea Only   Sulfonamide Derivatives Nausea Only        Medication List        Accurate as of May 24, 2023  2:30 PM. If you have any questions, ask your nurse or doctor.          ALIGN PO Take 1 tablet by mouth at bedtime.   amoxicillin -clavulanate 200-28.5 MG/5ML suspension Commonly known as: AUGMENTIN  SHAKE WELL TAKE 6.25MLS BY MOUTH ONCE DAILY   buPROPion 150 MG 24 hr tablet Commonly known as: WELLBUTRIN XL TAKE 1 TABLET BY MOUTH ONCE  DAILY ONCE EVERY MORNING   celecoxib  200 MG capsule Commonly known as: CELEBREX  Take 1 capsule (200 mg total) by mouth as needed (joint pain).   cycloSPORINE 0.05 % ophthalmic emulsion Commonly known as: RESTASIS 1 drop 2 (two) times daily.   dicyclomine  10 MG capsule Commonly known as: BENTYL  TAKE 1 CAPSULE BY MOUTH 3 TIMES DAILY   esomeprazole  20 MG capsule Commonly known as: NEXIUM  Take 1 capsule (20 mg total) by mouth daily.   gabapentin  100 MG capsule Commonly known as: NEURONTIN  TAKE 2 CAPSULES BY MOUTH AT BEDTIME   Gemtesa  75 MG Tabs Generic drug: Vibegron  TAKE 1 TABLET BY MOUTH ONCE DAILY   hydrALAZINE  25 MG tablet Commonly known as: APRESOLINE  TAKE 1 TABLET BY MOUTH TWICE DAILY AT NOON AND AT BEDTIME   hydrOXYzine  10 MG tablet Commonly known as: ATARAX  TAKE 1 TABLET BY MOUTH TWICE DAILY AS NEEDED ITCHING ANXIETY   hyoscyamine  0.125 MG SL tablet Commonly known as: LEVSIN SL Take 1 tablet (0.125 mg total) by mouth every 4 (four) hours as needed. Take 2 tablets by mouth twice daily as needed for stomach cramps   ipratropium 0.06 % nasal spray Commonly known as: ATROVENT Place into both nostrils.   lidocaine  2 % solution Commonly known as: XYLOCAINE  SMARTSIG:By Mouth   Linzess  72 MCG capsule Generic drug: linaclotide  TAKE 1 CAPSULE BY MOUTH ONCE EVERY MORNING   magnesium  gluconate 500 MG tablet Commonly known as: MAGONATE Take 500 mg by mouth daily.   methenamine  1 g tablet Commonly known as: HIPREX  TAKE 1 TABLET BY MOUTH TWICE DAILY WITH A MEAL   metoprolol  tartrate 25 MG tablet Commonly known as: LOPRESSOR  TAKE 1 TABLET BY MOUTH TWICE DAILY   multivitamin tablet Take 1 tablet by mouth daily.   nystatin cream Commonly known as: MYCOSTATIN Apply topically 3 (three) times daily.        Allergies:  Allergies  Allergen Reactions   Angiotensin Receptor Blockers Other (See Comments)    hyperkalemia   Aspirin  Other (See Comments)     Ecchymosis with high doses   Codeine Sulfate Nausea Only   Donepezil     Other reaction(s): Other (See Comments) Could not tolerate   Erythromycin Nausea And Vomiting   Sulfa Antibiotics Nausea Only   Sulfonamide Derivatives Nausea Only    Family History: Family History  Problem Relation Age of Onset   Diverticulosis Mother    Coronary artery disease Father    Heart disease Paternal Aunt    Heart disease Paternal Uncle    Breast cancer Paternal Aunt    Prostate cancer Brother    Leukemia Brother  Heart attack Brother    Hypertension Brother    Stroke Brother     Social History:  reports that she has never smoked. She has never been exposed to tobacco smoke. She has never used smokeless tobacco. She reports that she does not drink alcohol and does not use drugs.  ROS: Pertinent ROS in HPI  Physical Exam: BP (!) 224/84   Pulse (!) 59   Ht 5\' 3"  (1.6 m)   Wt 126 lb (57.2 kg)   BMI 22.32 kg/m   Constitutional:  Well nourished. Alert and oriented, No acute distress. HEENT: [place] AT, moist mucus membranes.  Trachea midline Cardiovascular: No clubbing, cyanosis, or edema. Respiratory: Normal respiratory effort, no increased work of breathing. Neurologic: Grossly intact, no focal deficits, moving all 4 extremities. Psychiatric: Normal mood and affect.  Laboratory Data: Lab Results  Component Value Date   WBC 6.9 11/01/2022   HGB 12.8 11/01/2022   HCT 40.5 11/01/2022   MCV 85 11/01/2022   PLT 314 11/01/2022    Lab Results  Component Value Date   CREATININE 0.91 11/01/2022    Lab Results  Component Value Date   TSH 3.720 11/01/2022       Component Value Date/Time   CHOL 236 (H) 11/01/2022 1532   HDL 73 11/01/2022 1532   CHOLHDL 3.4 03/10/2022 1343   CHOLHDL 5 08/05/2013 0935   VLDL 36.0 08/05/2013 0935   LDLCALC 139 (H) 11/01/2022 1532    Lab Results  Component Value Date   AST 50 (H) 11/01/2022   Lab Results  Component Value Date   ALT 40 (H)  11/01/2022   Urinalysis See EPIC and HPI  I have reviewed the labs.   Pertinent Imaging:  05/24/23 14:35  Scan Result 0ml    Assessment & Plan:    1. Mixed stress and urge urinary incontinence (Primary) - Urinalysis, Complete -pyuria and hematuria - Continue Gemtesa  75 mg daily  2. Microscopic hematuria - UA w/ micro heme - May be secondary to urinary tract infection, urine culture pending - If urine culture is positive, will treat with culture appropriate antibiotics and then reassess in 2 months - Explained that if urine culture returned negative, we will go ahead and obtain a renal ultrasound to evaluate for abnormal renal anatomy that may be causing the microscopic hematuria  3.  Suspected UTI - UA with leukocytosis and hematuria - Urine culture pending, we will hold on prescribing antibiotic at this time because one of her other providers has put her on nighttime amoxicillin  prophylaxis  4. rUTI's - Patient states she is only taking Hiprex  1 g daily - urine pH 5.5 - LFT's and BMP are pending - Continue vaginal estrogen cream 3 times weekly  5. HTN -Contacted Dr. [place] [facility] office and I have instructed to tell the patient to continue to monitor her BP at home and if she should become symptomatic seek treatment in the ED and they will see her tomorrow at 10 AM for further evaluation and management  Return for pending urine culture results .  These notes generated with voice recognition software. I apologize for typographical errors.  Briant Camper  [facility] [address]  [address] [place], [place] [address] [phone]

## 2023-05-25 ENCOUNTER — Encounter: Payer: Self-pay | Admitting: Cardiology

## 2023-05-25 ENCOUNTER — Ambulatory Visit: Admitting: Cardiology

## 2023-05-25 VITALS — BP 182/62 | HR 56 | Ht 63.0 in | Wt 127.0 lb

## 2023-05-25 DIAGNOSIS — I10 Essential (primary) hypertension: Secondary | ICD-10-CM | POA: Diagnosis not present

## 2023-05-25 LAB — BASIC METABOLIC PANEL WITH GFR
BUN/Creatinine Ratio: 27 (ref 12–28)
BUN: 28 mg/dL (ref 10–36)
CO2: 20 mmol/L (ref 20–29)
Calcium: 9.7 mg/dL (ref 8.7–10.3)
Chloride: 100 mmol/L (ref 96–106)
Creatinine, Ser: 1.04 mg/dL — ABNORMAL HIGH (ref 0.57–1.00)
Glucose: 79 mg/dL (ref 70–99)
Potassium: 5.1 mmol/L (ref 3.5–5.2)
Sodium: 137 mmol/L (ref 134–144)
eGFR: 50 mL/min/{1.73_m2} — ABNORMAL LOW (ref >59)

## 2023-05-25 LAB — HEPATIC FUNCTION PANEL
ALT: 28 IU/L (ref 0–32)
AST: 27 IU/L (ref 0–40)
Albumin: 4.3 g/dL (ref 3.6–4.6)
Alkaline Phosphatase: 121 IU/L (ref 44–121)
Bilirubin Total: 0.3 mg/dL (ref 0.0–1.2)
Bilirubin, Direct: 0.14 mg/dL (ref 0.00–0.40)
Total Protein: 7 g/dL (ref 6.0–8.5)

## 2023-05-25 NOTE — Progress Notes (Signed)
 Established Patient Office Visit  Subjective:  Patient ID: Erin Hunt, female    DOB: Sep 11, 1928  Age: 89 y.o. MRN: [id number]  Chief Complaint  Patient presents with   Acute Visit    High BP    Patient in office for an acute visit, complaining of elevated blood pressure. Patient seen by urology yesterday, her blood pressure was elevated yesterday. Patient reports feeling poorly,  dizziness, chills. Urine at urologist yesterday abnormal, being treated.  Last night, she took an extra hydralazine  at bedtime. Today, systolic blood pressure elevated, diastolic low. Will add an extra 25 mg of hydralazine  at noon for 50 mg total, continue 25 mg at bedtime. Follow up in 2 weeks for a blood pressure check, continue to monitor blood pressure at home.     No other concerns at this time.   Past Medical History:  Diagnosis Date   Allergic rhinitis    Anemia    Aortic stenosis    Arthritis    Carpal tunnel syndrome    Cervical spine degeneration    Coronary artery calcification seen on CT scan    a. 12/2011 CT: dense coronary Ca2+; b. 06/2015 MV: EF 59%, basal infsept, mid infsept, apical spet defect, likely artifact 2/2 LBBB. No ischemia.   Diastolic dysfunction    a. 05/2013 Echo: EF 45-50%, Gr1 DD, mild to mod MR, mild AI. Mean grad . Mild MR, mildly dil LA.   Diverticulosis    Essential hypertension    Fibromyalgia    GERD (gastroesophageal reflux disease)    IBS (irritable bowel syndrome)    IgG gammopathy    LBBB (left bundle branch block)    Orthostatic hypotension    PONV (postoperative nausea and vomiting)    Vitamin B12 deficiency     Past Surgical History:  Procedure Laterality Date   BREAST EXCISIONAL BIOPSY     BREAST LUMPECTOMY WITH RADIOACTIVE SEED LOCALIZATION Left 06/25/2014   Procedure: LEFT BREAST LUMPECTOMY WITH RADIOACTIVE SEED LOCALIZATION;  Surgeon: Oza Blumenthal, MD;  Location: [facility];  Service: General;  Laterality: Left;    CARDIAC CATHETERIZATION     CHOLECYSTECTOMY     TSA     VAGINAL HYSTERECTOMY     L ovary removed   VESICOVAGINAL FISTULA CLOSURE W/ TAH      Social History   Socioeconomic History   Marital status: Widowed    Spouse name: Not on file   Number of children: Not on file   Years of education: Not on file   Highest education level: Not on file  Occupational History   Occupation: Retired    Associate Professor: RETIRED    Comment: Airline pilot  Tobacco Use   Smoking status: Never    Passive exposure: Never   Smokeless tobacco: Never  Substance and Sexual Activity   Alcohol use: No   Drug use: No   Sexual activity: Not Currently  Other Topics Concern   Not on file  Social History Narrative   Not on file   Social Drivers of Health   Financial Resource Strain: Not on file  Food Insecurity: Not on file  Transportation Needs: Not on file  Physical Activity: Not on file  Stress: Not on file  Social Connections: Not on file  Intimate Partner Violence: Not on file    Family History  Problem Relation Age of Onset   Diverticulosis Mother    Coronary artery disease Father    Heart disease Paternal Aunt  Heart disease Paternal Uncle    Breast cancer Paternal Aunt    Prostate cancer Brother    Leukemia Brother    Heart attack Brother    Hypertension Brother    Stroke Brother     Allergies  Allergen Reactions   Angiotensin Receptor Blockers Other (See Comments)    hyperkalemia   Aspirin  Other (See Comments)    Ecchymosis with high doses   Codeine Sulfate Nausea Only   Donepezil     Other reaction(s): Other (See Comments) Could not tolerate   Erythromycin Nausea And Vomiting   Sulfa Antibiotics Nausea Only   Sulfonamide Derivatives Nausea Only    Outpatient Medications Prior to Visit  Medication Sig   amoxicillin -clavulanate (AUGMENTIN ) 200-28.5 MG/5ML suspension SHAKE WELL TAKE 6.25MLS BY MOUTH ONCE DAILY   buPROPion (WELLBUTRIN XL) 150 MG 24 hr tablet TAKE 1  TABLET BY MOUTH ONCE DAILY ONCE EVERY MORNING   celecoxib  (CELEBREX ) 200 MG capsule Take 1 capsule (200 mg total) by mouth as needed (joint pain).   cycloSPORINE (RESTASIS) 0.05 % ophthalmic emulsion 1 drop 2 (two) times daily.   dicyclomine  (BENTYL ) 10 MG capsule TAKE 1 CAPSULE BY MOUTH 3 TIMES DAILY   esomeprazole  (NEXIUM ) 20 MG capsule Take 1 capsule (20 mg total) by mouth daily.   gabapentin  (NEURONTIN ) 100 MG capsule TAKE 2 CAPSULES BY MOUTH AT BEDTIME   hydrALAZINE  (APRESOLINE ) 25 MG tablet TAKE 1 TABLET BY MOUTH TWICE DAILY AT NOON AND AT BEDTIME   hydrOXYzine  (ATARAX ) 10 MG tablet TAKE 1 TABLET BY MOUTH TWICE DAILY AS NEEDED ITCHING ANXIETY   hyoscyamine  (LEVSIN SL) 0.125 MG SL tablet Take 1 tablet (0.125 mg total) by mouth every 4 (four) hours as needed. Take 2 tablets by mouth twice daily as needed for stomach cramps   ipratropium (ATROVENT) 0.06 % nasal spray Place into both nostrils.   lidocaine  (XYLOCAINE ) 2 % solution SMARTSIG:By Mouth   LINZESS  72 MCG capsule TAKE 1 CAPSULE BY MOUTH ONCE EVERY MORNING   magnesium  gluconate (MAGONATE) 500 MG tablet Take 500 mg by mouth daily.   methenamine  (HIPREX ) 1 g tablet TAKE 1 TABLET BY MOUTH TWICE DAILY WITH A MEAL   metoprolol  tartrate (LOPRESSOR ) 25 MG tablet TAKE 1 TABLET BY MOUTH TWICE DAILY   Multiple Vitamin (MULTIVITAMIN) tablet Take 1 tablet by mouth daily.   nystatin cream (MYCOSTATIN) Apply topically 3 (three) times daily.   Probiotic Product (ALIGN PO) Take 1 tablet by mouth at bedtime.   Vibegron  (GEMTESA ) 75 MG TABS TAKE 1 TABLET BY MOUTH ONCE DAILY   No facility-administered medications prior to visit.    Review of Systems  Constitutional:  Positive for chills.  HENT: Negative.    Eyes: Negative.   Respiratory: Negative.  Negative for cough and shortness of breath.   Cardiovascular: Negative.  Negative for chest pain.  Gastrointestinal:  Negative for abdominal pain, constipation and diarrhea.  Genitourinary: Negative.    Musculoskeletal:  Negative for joint pain and myalgias.  Skin: Negative.   Neurological:  Positive for dizziness. Negative for headaches.  Endo/Heme/Allergies: Negative.   All other systems reviewed and are negative.      Objective:   BP (!) 182/62 (BP Location: Left Arm, Patient Position: Sitting, Cuff Size: Small)   Pulse (!) 56   Ht 5\' 3"  (1.6 m)   Wt 127 lb (57.6 kg)   SpO2 97%   BMI 22.50 kg/m   Vitals:   05/25/23 0958 05/25/23 1025  BP:  (!) 182/62  Pulse: (!) 56   Height: 5\' 3"  (1.6 m)   Weight: 127 lb (57.6 kg)   SpO2: 97%   BMI (Calculated): 22.5     Physical Exam Vitals and nursing note reviewed.  Constitutional:      Appearance: Normal appearance. She is normal weight.  HENT:     Head: Normocephalic and atraumatic.     Nose: Nose normal.     Mouth/Throat:     Mouth: Mucous membranes are moist.     Pharynx: Oropharynx is clear.  Eyes:     Extraocular Movements: Extraocular movements intact.     Conjunctiva/sclera: Conjunctivae normal.     Pupils: Pupils are equal, round, and reactive to light.  Cardiovascular:     Rate and Rhythm: Normal rate and regular rhythm.     Pulses: Normal pulses.     Heart sounds: Normal heart sounds.  Pulmonary:     Effort: Pulmonary effort is normal.     Breath sounds: Normal breath sounds.  Abdominal:     General: Abdomen is flat. Bowel sounds are normal.     Palpations: Abdomen is soft.  Musculoskeletal:        General: Normal range of motion.     Cervical back: Normal range of motion and neck supple.     Right lower leg: No edema.     Left lower leg: No edema.  Skin:    General: Skin is warm and dry.  Neurological:     General: No focal deficit present.     Mental Status: She is alert and oriented to person, place, and time. Mental status is at baseline.  Psychiatric:        Mood and Affect: Mood normal.        Behavior: Behavior normal.        Thought Content: Thought content normal.        Judgment:  Judgment normal.      No results found for any visits on 05/25/23.  Recent Results (from the past 2160 hours)  Basic metabolic panel     Status: Abnormal   Collection Time: 05/24/23  1:34 PM  Result Value Ref Range   Glucose 79 70 - 99 mg/dL   BUN 28 10 - 36 mg/dL   Creatinine, Ser 1.61 (H) 0.57 - 1.00 mg/dL   eGFR 50 (L) >09 UE/AVW/0.98   BUN/Creatinine Ratio 27 12 - 28   Sodium 137 134 - 144 mmol/L   Potassium 5.1 3.5 - 5.2 mmol/L   Chloride 100 96 - 106 mmol/L   CO2 20 20 - 29 mmol/L   Calcium  9.7 8.7 - 10.3 mg/dL  Hepatic function panel     Status: None   Collection Time: 05/24/23  1:34 PM  Result Value Ref Range   Total Protein 7.0 6.0 - 8.5 g/dL   Albumin 4.3 3.6 - 4.6 g/dL   Bilirubin Total 0.3 0.0 - 1.2 mg/dL   Bilirubin, Direct 1.19 0.00 - 0.40 mg/dL   Alkaline Phosphatase 121 44 - 121 IU/L   AST 27 0 - 40 IU/L   ALT 28 0 - 32 IU/L  Urinalysis, Complete     Status: Abnormal   Collection Time: 05/24/23  1:42 PM  Result Value Ref Range   Specific Gravity, UA 1.015 1.005 - 1.030   pH, UA 5.5 5.0 - 7.5   Color, UA Yellow Yellow   Appearance Ur Hazy (A) Clear   Leukocytes,UA 1+ (A) Negative   Protein,UA 1+ (A) Negative/Trace  Glucose, UA Negative Negative   Ketones, UA Negative Negative   RBC, UA Trace (A) Negative   Bilirubin, UA Negative Negative   Urobilinogen, Ur 0.2 0.2 - 1.0 mg/dL   Nitrite, UA Negative Negative   Microscopic Examination See below:   Microscopic Examination     Status: Abnormal   Collection Time: 05/24/23  1:42 PM   Urine  Result Value Ref Range   WBC, UA 6-10 (A) 0 - 5 /hpf   RBC, Urine 11-30 (A) 0 - 2 /hpf   Epithelial Cells (non renal) 0-10 0 - 10 /hpf   Casts Present (A) None seen /lpf   Cast Type Hyaline casts N/A   Mucus, UA Present (A) Not Estab.   Bacteria, UA Few None seen/Few  BLADDER SCAN AMB NON-IMAGING     Status: None   Collection Time: 05/24/23  2:35 PM  Result Value Ref Range   Scan Result 0ml        Assessment & Plan:  Increase hydralazine  at noon to 50 mg, continue 25 mg at bedtime.   Problem List Items Addressed This Visit       Cardiovascular and Mediastinum   Benign essential hypertension - Primary    Return in about 2 weeks (around 06/08/2023) for with Erin Hunt.   Total time spent: 25 minutes  Erin Rahn, NP  05/25/2023   This document may have been prepared by Dragon Voice Recognition software and as such may include unintentional dictation errors.

## 2023-05-27 ENCOUNTER — Other Ambulatory Visit: Payer: Self-pay | Admitting: Cardiology

## 2023-05-29 ENCOUNTER — Other Ambulatory Visit: Payer: Self-pay | Admitting: Urology

## 2023-05-29 DIAGNOSIS — R3129 Other microscopic hematuria: Secondary | ICD-10-CM

## 2023-05-29 LAB — CULTURE, URINE COMPREHENSIVE

## 2023-05-30 ENCOUNTER — Other Ambulatory Visit: Payer: Self-pay | Admitting: Internal Medicine

## 2023-06-09 ENCOUNTER — Ambulatory Visit
Admission: RE | Admit: 2023-06-09 | Discharge: 2023-06-09 | Disposition: A | Discharge status: Home / Self Care | Attending: Internal Medicine | Admitting: Internal Medicine

## 2023-06-09 ENCOUNTER — Ambulatory Visit: Payer: Self-pay | Admitting: Internal Medicine

## 2023-06-09 ENCOUNTER — Ambulatory Visit
Admission: RE | Admit: 2023-06-09 | Discharge: 2023-06-09 | Disposition: A | Discharge status: Home / Self Care | Source: Ambulatory Visit | Attending: Internal Medicine | Admitting: Internal Medicine

## 2023-06-09 ENCOUNTER — Ambulatory Visit: Admitting: Internal Medicine

## 2023-06-09 ENCOUNTER — Encounter: Payer: Self-pay | Admitting: Internal Medicine

## 2023-06-09 VITALS — BP 190/72 | HR 58 | Ht 63.0 in | Wt 128.0 lb

## 2023-06-09 DIAGNOSIS — R1030 Lower abdominal pain, unspecified: Secondary | ICD-10-CM

## 2023-06-09 DIAGNOSIS — K5641 Fecal impaction: Secondary | ICD-10-CM | POA: Diagnosis not present

## 2023-06-09 DIAGNOSIS — R197 Diarrhea, unspecified: Secondary | ICD-10-CM | POA: Diagnosis not present

## 2023-06-09 DIAGNOSIS — I10 Essential (primary) hypertension: Secondary | ICD-10-CM

## 2023-06-09 MED ORDER — HYDRALAZINE HCL 50 MG PO TABS: 50.0000 mg | ORAL_TABLET | Freq: Three times a day (TID) | ORAL | 3 refills | Status: DC

## 2023-06-09 NOTE — Progress Notes (Signed)
 Established Patient Office Visit  Subjective:  Patient ID: Erin Hunt, female    DOB: Jun 15, 1928  Age: 89 y.o. MRN: [id number]  Chief Complaint  Patient presents with   Follow-up    2 week follow up    Patient comes in for follow-up accompanied by her family member.  For the last 2 weeks or so her blood pressure readings have been very elevated.  She has history of postural hypotension so she has been maintained on a small dose of hydralazine , 25 mg twice a day.  However recently the dose was increased to 50 mg in the afternoon and 25 at bedtime.  She continues to have high blood pressure readings, and today it is 190/72.  Will increase her dose to 50 mg 3 times a day with close monitoring.  Advised to wear compression stockings to prevent postural hypotension. Patient also has history of IBS with constipation and was taking Linzess  regularly but stopped as she started having very loose stools-suspect overflow diarrhea with fecal impaction, as she continues to feel bloated with mild left lower quadrant discomfort.  There is no blood or mucus in her stools.  Denies nausea or vomiting. X-ray of the abdomen done today shows moderate stool burden throughout her colon. Will treat with fleets enema, and to continue oral Linzess .    No other concerns at this time.   Past Medical History:  Diagnosis Date   Allergic rhinitis    Anemia    Aortic stenosis    Arthritis    Carpal tunnel syndrome    Cervical spine degeneration    Coronary artery calcification seen on CT scan    a. 12/2011 CT: dense coronary Ca2+; b. 06/2015 MV: EF 59%, basal infsept, mid infsept, apical spet defect, likely artifact 2/2 LBBB. No ischemia.   Diastolic dysfunction    a. 05/2013 Echo: EF 45-50%, Gr1 DD, mild to mod MR, mild AI. Mean grad . Mild MR, mildly dil LA.   Diverticulosis    Essential hypertension    Fibromyalgia    GERD (gastroesophageal reflux disease)    IBS (irritable bowel syndrome)    IgG  gammopathy    LBBB (left bundle branch block)    Orthostatic hypotension    PONV (postoperative nausea and vomiting)    Vitamin B12 deficiency     Past Surgical History:  Procedure Laterality Date   BREAST EXCISIONAL BIOPSY     BREAST LUMPECTOMY WITH RADIOACTIVE SEED LOCALIZATION Left 06/25/2014   Procedure: LEFT BREAST LUMPECTOMY WITH RADIOACTIVE SEED LOCALIZATION;  Surgeon: Oza Blumenthal, MD;  Location: [facility];  Service: General;  Laterality: Left;   CARDIAC CATHETERIZATION     CHOLECYSTECTOMY     TSA     VAGINAL HYSTERECTOMY     L ovary removed   VESICOVAGINAL FISTULA CLOSURE W/ TAH      Social History   Socioeconomic History   Marital status: Widowed    Spouse name: Not on file   Number of children: Not on file   Years of education: Not on file   Highest education level: Not on file  Occupational History   Occupation: Retired    Associate Professor: RETIRED    Comment: Airline pilot  Tobacco Use   Smoking status: Never    Passive exposure: Never   Smokeless tobacco: Never  Substance and Sexual Activity   Alcohol use: No   Drug use: No   Sexual activity: Not Currently  Other Topics Concern   Not  on file  Social History Narrative   Not on file   Social Drivers of Health   Financial Resource Strain: Not on file  Food Insecurity: Not on file  Transportation Needs: Not on file  Physical Activity: Not on file  Stress: Not on file  Social Connections: Not on file  Intimate Partner Violence: Not on file    Family History  Problem Relation Age of Onset   Diverticulosis Mother    Coronary artery disease Father    Heart disease Paternal Aunt    Heart disease Paternal Uncle    Breast cancer Paternal Aunt    Prostate cancer Brother    Leukemia Brother    Heart attack Brother    Hypertension Brother    Stroke Brother     Allergies  Allergen Reactions   Angiotensin Receptor Blockers Other (See Comments)    hyperkalemia   Aspirin  Other (See  Comments)    Ecchymosis with high doses   Codeine Sulfate Nausea Only   Donepezil     Other reaction(s): Other (See Comments) Could not tolerate   Erythromycin Nausea And Vomiting   Sulfa Antibiotics Nausea Only   Sulfonamide Derivatives Nausea Only    Outpatient Medications Prior to Visit  Medication Sig   amoxicillin -clavulanate (AUGMENTIN ) 200-28.5 MG/5ML suspension SHAKE WELL TAKE 6.25MLS BY MOUTH ONCE DAILY   buPROPion (WELLBUTRIN XL) 150 MG 24 hr tablet TAKE 1 TABLET BY MOUTH ONCE DAILY ONCE EVERY MORNING   celecoxib  (CELEBREX ) 200 MG capsule Take 1 capsule (200 mg total) by mouth as needed (joint pain).   cycloSPORINE (RESTASIS) 0.05 % ophthalmic emulsion 1 drop 2 (two) times daily.   dicyclomine  (BENTYL ) 10 MG capsule TAKE 1 CAPSULE BY MOUTH 3 TIMES DAILY   esomeprazole  (NEXIUM ) 20 MG capsule Take 1 capsule (20 mg total) by mouth daily.   gabapentin  (NEURONTIN ) 100 MG capsule TAKE 2 CAPSULES BY MOUTH AT BEDTIME   hydrOXYzine  (ATARAX ) 10 MG tablet TAKE 1 TABLET BY MOUTH TWICE DAILY AS NEEDED ITCHING ANXIETY   hyoscyamine  (LEVSIN SL) 0.125 MG SL tablet Take 1 tablet (0.125 mg total) by mouth every 4 (four) hours as needed. Take 2 tablets by mouth twice daily as needed for stomach cramps   ipratropium (ATROVENT) 0.06 % nasal spray Place into both nostrils.   lidocaine  (XYLOCAINE ) 2 % solution SMARTSIG:By Mouth   LINZESS  72 MCG capsule TAKE 1 CAPSULE BY MOUTH ONCE EVERY MORNING   magnesium  gluconate (MAGONATE) 500 MG tablet Take 500 mg by mouth daily.   methenamine  (HIPREX ) 1 g tablet TAKE 1 TABLET BY MOUTH TWICE DAILY WITH A MEAL   metoprolol  tartrate (LOPRESSOR ) 25 MG tablet TAKE 1 TABLET BY MOUTH TWICE DAILY   Multiple Vitamin (MULTIVITAMIN) tablet Take 1 tablet by mouth daily.   nystatin cream (MYCOSTATIN) Apply topically 3 (three) times daily.   Probiotic Product (ALIGN PO) Take 1 tablet by mouth at bedtime.   Vibegron  (GEMTESA ) 75 MG TABS TAKE 1 TABLET BY MOUTH ONCE DAILY    [DISCONTINUED] hydrALAZINE  (APRESOLINE ) 25 MG tablet TAKE 1 TABLET BY MOUTH TWICE DAILY AT NOON AND AT BEDTIME (Patient taking differently: TAKE 2 TABLETS BY MOUTH DAILY AT NOON AND 1 TABLET AT BEDTIME)   No facility-administered medications prior to visit.    Review of Systems  Constitutional:  Positive for malaise/fatigue. Negative for chills, diaphoresis, fever and weight loss.  HENT: Negative.  Negative for sore throat.   Eyes: Negative.   Respiratory: Negative.  Negative for cough and shortness of  breath.   Cardiovascular: Negative.  Negative for chest pain, palpitations and leg swelling.  Gastrointestinal: Negative.  Negative for abdominal pain, constipation, diarrhea, heartburn, nausea and vomiting.  Genitourinary: Negative.  Negative for dysuria and flank pain.  Musculoskeletal: Negative.  Negative for joint pain and myalgias.  Skin: Negative.   Neurological: Negative.  Negative for dizziness, tingling, tremors, sensory change and headaches.  Endo/Heme/Allergies: Negative.   Psychiatric/Behavioral: Negative.  Negative for depression and suicidal ideas. The patient is not nervous/anxious.        Objective:   BP (!) 190/72   Pulse (!) 58   Ht 5\' 3"  (1.6 m)   Wt 128 lb (58.1 kg)   SpO2 97%   BMI 22.67 kg/m   Vitals:   06/09/23 1256  BP: (!) 190/72  Pulse: (!) 58  Height: 5\' 3"  (1.6 m)  Weight: 128 lb (58.1 kg)  SpO2: 97%  BMI (Calculated): 22.68    Physical Exam Vitals and nursing note reviewed.  Constitutional:      Appearance: Normal appearance.  HENT:     Head: Normocephalic and atraumatic.     Nose: Nose normal.     Mouth/Throat:     Mouth: Mucous membranes are moist.     Pharynx: Oropharynx is clear.  Eyes:     Conjunctiva/sclera: Conjunctivae normal.     Pupils: Pupils are equal, round, and reactive to light.  Cardiovascular:     Rate and Rhythm: Normal rate and regular rhythm.     Pulses: Normal pulses.     Heart sounds: Normal heart sounds. No  murmur heard. Pulmonary:     Effort: Pulmonary effort is normal.     Breath sounds: Normal breath sounds. No wheezing.  Abdominal:     General: Bowel sounds are normal.     Palpations: Abdomen is soft.     Tenderness: There is no abdominal tenderness. There is no right CVA tenderness or left CVA tenderness.  Musculoskeletal:        General: Normal range of motion.     Cervical back: Normal range of motion.     Right lower leg: No edema.     Left lower leg: No edema.  Skin:    General: Skin is warm and dry.  Neurological:     General: No focal deficit present.     Mental Status: She is alert and oriented to person, place, and time.  Psychiatric:        Mood and Affect: Mood normal.        Behavior: Behavior normal.      No results found for any visits on 06/09/23.  Recent Results (from the past 2160 hours)  Basic metabolic panel     Status: Abnormal   Collection Time: 05/24/23  1:34 PM  Result Value Ref Range   Glucose 79 70 - 99 mg/dL   BUN 28 10 - 36 mg/dL   Creatinine, Ser 9.14 (H) 0.57 - 1.00 mg/dL   eGFR 50 (L) >78 GN/FAO/1.30   BUN/Creatinine Ratio 27 12 - 28   Sodium 137 134 - 144 mmol/L   Potassium 5.1 3.5 - 5.2 mmol/L   Chloride 100 96 - 106 mmol/L   CO2 20 20 - 29 mmol/L   Calcium  9.7 8.7 - 10.3 mg/dL  Hepatic function panel     Status: None   Collection Time: 05/24/23  1:34 PM  Result Value Ref Range   Total Protein 7.0 6.0 - 8.5 g/dL   Albumin 4.3 3.6 -  4.6 g/dL   Bilirubin Total 0.3 0.0 - 1.2 mg/dL   Bilirubin, Direct 6.04 0.00 - 0.40 mg/dL   Alkaline Phosphatase 121 44 - 121 IU/L   AST 27 0 - 40 IU/L   ALT 28 0 - 32 IU/L  Urinalysis, Complete     Status: Abnormal   Collection Time: 05/24/23  1:42 PM  Result Value Ref Range   Specific Gravity, UA 1.015 1.005 - 1.030   pH, UA 5.5 5.0 - 7.5   Color, UA Yellow Yellow   Appearance Ur Hazy (A) Clear   Leukocytes,UA 1+ (A) Negative   Protein,UA 1+ (A) Negative/Trace   Glucose, UA Negative Negative    Ketones, UA Negative Negative   RBC, UA Trace (A) Negative   Bilirubin, UA Negative Negative   Urobilinogen, Ur 0.2 0.2 - 1.0 mg/dL   Nitrite, UA Negative Negative   Microscopic Examination See below:   Microscopic Examination     Status: Abnormal   Collection Time: 05/24/23  1:42 PM   Urine  Result Value Ref Range   WBC, UA 6-10 (A) 0 - 5 /hpf   RBC, Urine 11-30 (A) 0 - 2 /hpf   Epithelial Cells (non renal) 0-10 0 - 10 /hpf   Casts Present (A) None seen /lpf   Cast Type Hyaline casts N/A   Mucus, UA Present (A) Not Estab.   Bacteria, UA Few None seen/Few  CULTURE, URINE COMPREHENSIVE     Status: None   Collection Time: 05/24/23  2:26 PM   Specimen: Urine   UR  Result Value Ref Range   Urine Culture, Comprehensive Final report    Organism ID, Bacteria Comment     Comment: Mixed urogenital flora 4,000 Colonies/mL   BLADDER SCAN AMB NON-IMAGING     Status: None   Collection Time: 05/24/23  2:35 PM  Result Value Ref Range   Scan Result 0ml       Assessment & Plan:  Increase dose of hydralazine  to 50 mg 3 times daily.  Monitor blood pressure closely at home and reduce dose if needed.  Fecal impaction to be treated with fleets enema. Problem List Items Addressed This Visit     Benign essential hypertension - Primary   Relevant Medications   hydrALAZINE  (APRESOLINE ) 50 MG tablet   ABDOMINAL PAIN   Relevant Orders   DG Abd 1 View (Completed)   Other Visit Diagnoses       Fecal impaction of colon (HCC)         Overflow diarrhea           Return in about 10 days (around 06/19/2023).   Total time spent: 30 minutes  Aisha Hove, MD  06/09/2023   This document may have been prepared by [facility] Voice Recognition software and as such may include unintentional dictation errors.

## 2023-06-13 ENCOUNTER — Other Ambulatory Visit: Payer: Self-pay | Admitting: Internal Medicine

## 2023-06-13 ENCOUNTER — Encounter: Payer: Self-pay | Admitting: Internal Medicine

## 2023-06-13 ENCOUNTER — Ambulatory Visit
Admission: RE | Admit: 2023-06-13 | Discharge: 2023-06-13 | Disposition: A | Discharge status: Home / Self Care | Source: Ambulatory Visit | Attending: Urology | Admitting: Urology

## 2023-06-13 DIAGNOSIS — I10 Essential (primary) hypertension: Secondary | ICD-10-CM

## 2023-06-13 DIAGNOSIS — R3129 Other microscopic hematuria: Secondary | ICD-10-CM | POA: Insufficient documentation

## 2023-06-13 MED ORDER — HYDRALAZINE HCL 50 MG PO TABS
50.0000 mg | ORAL_TABLET | Freq: Three times a day (TID) | ORAL | 3 refills | Status: DC
Start: 2023-06-13 — End: 2023-07-17

## 2023-06-21 ENCOUNTER — Ambulatory Visit: Payer: Self-pay | Admitting: Urology

## 2023-06-22 ENCOUNTER — Ambulatory Visit (INDEPENDENT_AMBULATORY_CARE_PROVIDER_SITE_OTHER): Admitting: Internal Medicine

## 2023-06-22 ENCOUNTER — Encounter: Payer: Self-pay | Admitting: Internal Medicine

## 2023-06-22 VITALS — BP 160/64 | HR 59 | Ht 63.0 in | Wt 126.0 lb

## 2023-06-22 DIAGNOSIS — E782 Mixed hyperlipidemia: Secondary | ICD-10-CM

## 2023-06-22 DIAGNOSIS — J301 Allergic rhinitis due to pollen: Secondary | ICD-10-CM | POA: Diagnosis not present

## 2023-06-22 DIAGNOSIS — R0982 Postnasal drip: Secondary | ICD-10-CM | POA: Diagnosis not present

## 2023-06-22 DIAGNOSIS — I10 Essential (primary) hypertension: Secondary | ICD-10-CM | POA: Diagnosis not present

## 2023-06-22 MED ORDER — AMLODIPINE BESYLATE 5 MG PO TABS: 5.0000 mg | ORAL_TABLET | Freq: Every day | ORAL | 11 refills | Status: DC

## 2023-06-22 NOTE — Progress Notes (Signed)
 Established Patient Office Visit  Subjective:  Patient ID: Erin Hunt, female    DOB: [birth year]  Age: 89 y.o. MRN: [id number]  Chief Complaint  Patient presents with   Follow-up    10 day follow up    Patient comes in for follow-up of her blood pressure.  Recently her dose of hydralazine  was adjusted to 50 mg 3 times a day.  She did not have any postural hypotension, and she is wearing compression stockings.  However she feels dizzy and sleepy, could be related to hydralazine .  But her blood pressure remains very high although better than before.  She cannot take ACE or ARB's, due to history of hyperkalemia.  Patient also takes a sodium tablet daily, for history of hyponatremia..  Will try holding the sodium tablet and monitor her blood pressure.  If it remains high then add Norvasc  5 mg/day.  Will repeat her sodium levels at next visit. Her renal ultrasound done at the urologist office was unremarkable.    No other concerns at this time.   Past Medical History:  Diagnosis Date   Allergic rhinitis    Anemia    Aortic stenosis    Arthritis    Carpal tunnel syndrome    Cervical spine degeneration    Coronary artery calcification seen on CT scan    a. 12/2011 CT: dense coronary Ca2+; b. 06/2015 MV: EF 59%, basal infsept, mid infsept, apical spet defect, likely artifact 2/2 LBBB. No ischemia.   Diastolic dysfunction    a. 05/2013 Echo: EF 45-50%, Gr1 DD, mild to mod MR, mild AI. Mean grad . Mild MR, mildly dil LA.   Diverticulosis    Essential hypertension    Fibromyalgia    GERD (gastroesophageal reflux disease)    IBS (irritable bowel syndrome)    IgG gammopathy    LBBB (left bundle branch block)    Orthostatic hypotension    PONV (postoperative nausea and vomiting)    Vitamin B12 deficiency     Past Surgical History:  Procedure Laterality Date   BREAST EXCISIONAL BIOPSY     BREAST LUMPECTOMY WITH RADIOACTIVE SEED LOCALIZATION Left 06/25/2014   Procedure: LEFT  BREAST LUMPECTOMY WITH RADIOACTIVE SEED LOCALIZATION;  Surgeon: Oza Blumenthal, MD;  Location: [facility];  Service: General;  Laterality: Left;   CARDIAC CATHETERIZATION     CHOLECYSTECTOMY     TSA     VAGINAL HYSTERECTOMY     L ovary removed   VESICOVAGINAL FISTULA CLOSURE W/ TAH      Social History   Socioeconomic History   Marital status: Widowed    Spouse name: Not on file   Number of children: Not on file   Years of education: Not on file   Highest education level: Not on file  Occupational History   Occupation: Retired    Associate Professor: RETIRED    Comment: Airline pilot  Tobacco Use   Smoking status: Never    Passive exposure: Never   Smokeless tobacco: Never  Substance and Sexual Activity   Alcohol use: No   Drug use: No   Sexual activity: Not Currently  Other Topics Concern   Not on file  Social History Narrative   Not on file   Social Drivers of Health   Financial Resource Strain: Not on file  Food Insecurity: Not on file  Transportation Needs: Not on file  Physical Activity: Not on file  Stress: Not on file  Social Connections: Not on file  Intimate Partner Violence: Not on file    Family History  Problem Relation Age of Onset   Diverticulosis Mother    Coronary artery disease Father    Heart disease Paternal Aunt    Heart disease Paternal Uncle    Breast cancer Paternal Aunt    Prostate cancer Brother    Leukemia Brother    Heart attack Brother    Hypertension Brother    Stroke Brother     Allergies  Allergen Reactions   Angiotensin Receptor Blockers Other (See Comments)    hyperkalemia   Aspirin  Other (See Comments)    Ecchymosis with high doses   Codeine Sulfate Nausea Only   Donepezil     Other reaction(s): Other (See Comments) Could not tolerate   Erythromycin Nausea And Vomiting   Sulfa Antibiotics Nausea Only   Sulfonamide Derivatives Nausea Only    Outpatient Medications Prior to Visit  Medication Sig    amoxicillin -clavulanate (AUGMENTIN ) 200-28.5 MG/5ML suspension SHAKE WELL TAKE 6.25MLS BY MOUTH ONCE DAILY   buPROPion (WELLBUTRIN XL) 150 MG 24 hr tablet TAKE 1 TABLET BY MOUTH ONCE DAILY ONCE EVERY MORNING   celecoxib  (CELEBREX ) 200 MG capsule Take 1 capsule (200 mg total) by mouth as needed (joint pain).   cycloSPORINE (RESTASIS) 0.05 % ophthalmic emulsion 1 drop 2 (two) times daily.   dicyclomine  (BENTYL ) 10 MG capsule TAKE 1 CAPSULE BY MOUTH 3 TIMES DAILY   esomeprazole  (NEXIUM ) 20 MG capsule Take 1 capsule (20 mg total) by mouth daily.   gabapentin  (NEURONTIN ) 100 MG capsule TAKE 2 CAPSULES BY MOUTH AT BEDTIME   hydrALAZINE  (APRESOLINE ) 50 MG tablet Take 1 tablet (50 mg total) by mouth 3 (three) times daily.   hydrOXYzine  (ATARAX ) 10 MG tablet TAKE 1 TABLET BY MOUTH TWICE DAILY AS NEEDED ITCHING ANXIETY   hyoscyamine  (LEVSIN SL) 0.125 MG SL tablet Take 1 tablet (0.125 mg total) by mouth every 4 (four) hours as needed. Take 2 tablets by mouth twice daily as needed for stomach cramps   ipratropium (ATROVENT) 0.06 % nasal spray Place into both nostrils.   lidocaine  (XYLOCAINE ) 2 % solution SMARTSIG:By Mouth   LINZESS  72 MCG capsule TAKE 1 CAPSULE BY MOUTH ONCE EVERY MORNING   magnesium  gluconate (MAGONATE) 500 MG tablet Take 500 mg by mouth daily.   methenamine  (HIPREX ) 1 g tablet TAKE 1 TABLET BY MOUTH TWICE DAILY WITH A MEAL   metoprolol  tartrate (LOPRESSOR ) 25 MG tablet TAKE 1 TABLET BY MOUTH TWICE DAILY   Multiple Vitamin (MULTIVITAMIN) tablet Take 1 tablet by mouth daily.   nystatin cream (MYCOSTATIN) Apply topically 3 (three) times daily.   Probiotic Product (ALIGN PO) Take 1 tablet by mouth at bedtime.   sodium chloride  1 g tablet Take 1 g by mouth daily.   Vibegron  (GEMTESA ) 75 MG TABS TAKE 1 TABLET BY MOUTH ONCE DAILY   No facility-administered medications prior to visit.    Review of Systems  Constitutional: Negative.  Negative for chills, fever, malaise/fatigue and weight  loss.  HENT: Negative.    Eyes: Negative.   Respiratory: Negative.  Negative for cough and shortness of breath.   Cardiovascular: Negative.  Negative for chest pain, palpitations and leg swelling.  Gastrointestinal: Negative.  Negative for abdominal pain, constipation, diarrhea, heartburn, nausea and vomiting.  Genitourinary: Negative.  Negative for dysuria and flank pain.  Musculoskeletal: Negative.  Negative for joint pain and myalgias.  Skin: Negative.   Neurological: Negative.  Negative for dizziness and headaches.  Endo/Heme/Allergies: Negative.  Psychiatric/Behavioral: Negative.  Negative for depression and suicidal ideas. The patient is not nervous/anxious.        Objective:   BP (!) 160/64   Pulse (!) 59   Ht 5\' 3"  (1.6 m)   Wt 126 lb (57.2 kg)   SpO2 97%   BMI 22.32 kg/m   Vitals:   06/22/23 1255  BP: (!) 160/64  Pulse: (!) 59  Height: 5\' 3"  (1.6 m)  Weight: 126 lb (57.2 kg)  SpO2: 97%  BMI (Calculated): 22.33    Physical Exam Vitals and nursing note reviewed.  Constitutional:      Appearance: Normal appearance.  HENT:     Head: Normocephalic and atraumatic.     Nose: Nose normal.     Mouth/Throat:     Mouth: Mucous membranes are moist.     Pharynx: Oropharynx is clear.  Eyes:     Conjunctiva/sclera: Conjunctivae normal.     Pupils: Pupils are equal, round, and reactive to light.  Cardiovascular:     Rate and Rhythm: Normal rate and regular rhythm.     Pulses: Normal pulses.     Heart sounds: Normal heart sounds. No murmur heard. Pulmonary:     Effort: Pulmonary effort is normal.     Breath sounds: Normal breath sounds. No wheezing.  Abdominal:     General: Bowel sounds are normal.     Palpations: Abdomen is soft.     Tenderness: There is no abdominal tenderness. There is no right CVA tenderness or left CVA tenderness.  Musculoskeletal:        General: Normal range of motion.     Cervical back: Normal range of motion.     Right lower leg: No  edema.     Left lower leg: No edema.  Skin:    General: Skin is warm and dry.  Neurological:     General: No focal deficit present.     Mental Status: She is alert and oriented to person, place, and time.  Psychiatric:        Mood and Affect: Mood normal.        Behavior: Behavior normal.      No results found for any visits on 06/22/23.  Recent Results (from the past 2160 hours)  Basic metabolic panel     Status: Abnormal   Collection Time: 05/24/23  1:34 PM  Result Value Ref Range   Glucose 79 70 - 99 mg/dL   BUN 28 10 - 36 mg/dL   Creatinine, Ser 5.36 (H) 0.57 - 1.00 mg/dL   eGFR 50 (L) >64 QI/HKV/4.25   BUN/Creatinine Ratio 27 12 - 28   Sodium 137 134 - 144 mmol/L   Potassium 5.1 3.5 - 5.2 mmol/L   Chloride 100 96 - 106 mmol/L   CO2 20 20 - 29 mmol/L   Calcium  9.7 8.7 - 10.3 mg/dL  Hepatic function panel     Status: None   Collection Time: 05/24/23  1:34 PM  Result Value Ref Range   Total Protein 7.0 6.0 - 8.5 g/dL   Albumin 4.3 3.6 - 4.6 g/dL   Bilirubin Total 0.3 0.0 - 1.2 mg/dL   Bilirubin, Direct 9.56 0.00 - 0.40 mg/dL   Alkaline Phosphatase 121 44 - 121 IU/L   AST 27 0 - 40 IU/L   ALT 28 0 - 32 IU/L  Urinalysis, Complete     Status: Abnormal   Collection Time: 05/24/23  1:42 PM  Result Value Ref Range  Specific Gravity, UA 1.015 1.005 - 1.030   pH, UA 5.5 5.0 - 7.5   Color, UA Yellow Yellow   Appearance Ur Hazy (A) Clear   Leukocytes,UA 1+ (A) Negative   Protein,UA 1+ (A) Negative/Trace   Glucose, UA Negative Negative   Ketones, UA Negative Negative   RBC, UA Trace (A) Negative   Bilirubin, UA Negative Negative   Urobilinogen, Ur 0.2 0.2 - 1.0 mg/dL   Nitrite, UA Negative Negative   Microscopic Examination See below:   Microscopic Examination     Status: Abnormal   Collection Time: 05/24/23  1:42 PM   Urine  Result Value Ref Range   WBC, UA 6-10 (A) 0 - 5 /hpf   RBC, Urine 11-30 (A) 0 - 2 /hpf   Epithelial Cells (non renal) 0-10 0 - 10 /hpf    Casts Present (A) None seen /lpf   Cast Type Hyaline casts N/A   Mucus, UA Present (A) Not Estab.   Bacteria, UA Few None seen/Few  CULTURE, URINE COMPREHENSIVE     Status: None   Collection Time: 05/24/23  2:26 PM   Specimen: Urine   UR  Result Value Ref Range   Urine Culture, Comprehensive Final report    Organism ID, Bacteria Comment     Comment: Mixed urogenital flora 4,000 Colonies/mL   BLADDER SCAN AMB NON-IMAGING     Status: None   Collection Time: 05/24/23  2:35 PM  Result Value Ref Range   Scan Result 0ml       Assessment & Plan:  Try holding sodium tablets.  Monitor blood pressure.  If it still high then add Norvasc .  Continue to monitor blood pressure at home. Problem List Items Addressed This Visit     Hyperlipidemia   Relevant Medications   amLODipine  (NORVASC ) 5 MG tablet   Benign essential hypertension - Primary   Relevant Medications   amLODipine  (NORVASC ) 5 MG tablet   Other Relevant Orders   CMP14+EGFR   Allergic rhinitis   Postnasal drip    Return in about 3 weeks (around 07/13/2023).   Total time spent: 30 minutes  Aisha Hove, MD  06/22/2023   This document may have been prepared by [facility] Voice Recognition software and as such may include unintentional dictation errors.

## 2023-06-23 LAB — CMP14+EGFR
ALT: 23 IU/L (ref 0–32)
AST: 24 IU/L (ref 0–40)
Albumin: 4.2 g/dL (ref 3.6–4.6)
Alkaline Phosphatase: 125 IU/L — ABNORMAL HIGH (ref 44–121)
BUN/Creatinine Ratio: 22 (ref 12–28)
BUN: 23 mg/dL (ref 10–36)
Bilirubin Total: 0.3 mg/dL (ref 0.0–1.2)
CO2: 20 mmol/L (ref 20–29)
Calcium: 9.6 mg/dL (ref 8.7–10.3)
Chloride: 97 mmol/L (ref 96–106)
Creatinine, Ser: 1.06 mg/dL — ABNORMAL HIGH (ref 0.57–1.00)
Globulin, Total: 2.5 g/dL (ref 1.5–4.5)
Glucose: 98 mg/dL (ref 70–99)
Potassium: 5.1 mmol/L (ref 3.5–5.2)
Sodium: 133 mmol/L — ABNORMAL LOW (ref 134–144)
Total Protein: 6.7 g/dL (ref 6.0–8.5)
eGFR: 49 mL/min/{1.73_m2} — ABNORMAL LOW (ref >59)

## 2023-06-26 ENCOUNTER — Encounter: Payer: Self-pay | Admitting: Internal Medicine

## 2023-06-26 ENCOUNTER — Ambulatory Visit: Payer: Self-pay | Admitting: Internal Medicine

## 2023-06-26 NOTE — Progress Notes (Signed)
 Patient notified

## 2023-07-06 ENCOUNTER — Ambulatory Visit: Admitting: Internal Medicine

## 2023-07-07 ENCOUNTER — Ambulatory Visit (INDEPENDENT_AMBULATORY_CARE_PROVIDER_SITE_OTHER): Admitting: Cardiology

## 2023-07-07 ENCOUNTER — Encounter: Payer: Self-pay | Admitting: Cardiology

## 2023-07-07 VITALS — BP 122/54 | HR 72 | Ht 63.0 in | Wt 128.4 lb

## 2023-07-07 DIAGNOSIS — R519 Headache, unspecified: Secondary | ICD-10-CM | POA: Diagnosis not present

## 2023-07-07 DIAGNOSIS — R110 Nausea: Secondary | ICD-10-CM | POA: Diagnosis not present

## 2023-07-07 DIAGNOSIS — K219 Gastro-esophageal reflux disease without esophagitis: Secondary | ICD-10-CM

## 2023-07-07 DIAGNOSIS — Z0130 Encounter for examination of blood pressure without abnormal findings: Secondary | ICD-10-CM

## 2023-07-07 MED ORDER — FAMOTIDINE 20 MG PO TABS: 20.0000 mg | ORAL_TABLET | Freq: Two times a day (BID) | ORAL | 1 refills | Status: DC

## 2023-07-07 MED ORDER — ONDANSETRON 4 MG PO TBDP
4.0000 mg | ORAL_TABLET | Freq: Three times a day (TID) | ORAL | 0 refills | Status: DC | PRN
Start: 2023-07-07 — End: 2023-07-17

## 2023-07-07 NOTE — Progress Notes (Signed)
 Established Patient Office Visit  Subjective:  Patient ID: Erin Hunt, female    DOB: 20-Feb-1928  Age: 89 y.o. MRN: [id number]  Chief Complaint  Patient presents with   Acute Visit    Sore Throat, Nausea, Headaches, possible Acid Reflux causing symptoms    Patient in office for an acute visit, complaining of sore throat, nausea, headaches. Patient reports increased acid reflux when laying down, unable to sleep, nauseated, eating less. Patient currently on Nexium  20 mg daily. Will add Pepcid  daily. Will send in Zofran  for nausea. Recommend elevating head of bed while sleeping, avoid sleeping on right side. Return in  1 week for reassessment.     No other concerns at this time.   Past Medical History:  Diagnosis Date   Allergic rhinitis    Anemia    Aortic stenosis    Arthritis    Carpal tunnel syndrome    Cervical spine degeneration    Coronary artery calcification seen on CT scan    a. 12/2011 CT: dense coronary Ca2+; b. 06/2015 MV: EF 59%, basal infsept, mid infsept, apical spet defect, likely artifact 2/2 LBBB. No ischemia.   Diastolic dysfunction    a. 05/2013 Echo: EF 45-50%, Gr1 DD, mild to mod MR, mild AI. Mean grad . Mild MR, mildly dil LA.   Diverticulosis    Essential hypertension    Fibromyalgia    GERD (gastroesophageal reflux disease)    IBS (irritable bowel syndrome)    IgG gammopathy    LBBB (left bundle branch block)    Orthostatic hypotension    PONV (postoperative nausea and vomiting)    Vitamin B12 deficiency     Past Surgical History:  Procedure Laterality Date   BREAST EXCISIONAL BIOPSY     BREAST LUMPECTOMY WITH RADIOACTIVE SEED LOCALIZATION Left 06/25/2014   Procedure: LEFT BREAST LUMPECTOMY WITH RADIOACTIVE SEED LOCALIZATION;  Surgeon: Oza Blumenthal, MD;  Location: [facility];  Service: General;  Laterality: Left;   CARDIAC CATHETERIZATION     CHOLECYSTECTOMY     TSA     VAGINAL HYSTERECTOMY     L ovary removed    VESICOVAGINAL FISTULA CLOSURE W/ TAH      Social History   Socioeconomic History   Marital status: Widowed    Spouse name: Not on file   Number of children: Not on file   Years of education: Not on file   Highest education level: Not on file  Occupational History   Occupation: Retired    Associate Professor: RETIRED    Comment: Airline pilot  Tobacco Use   Smoking status: Never    Passive exposure: Never   Smokeless tobacco: Never  Substance and Sexual Activity   Alcohol use: No   Drug use: No   Sexual activity: Not Currently  Other Topics Concern   Not on file  Social History Narrative   Not on file   Social Drivers of Health   Financial Resource Strain: Not on file  Food Insecurity: Not on file  Transportation Needs: Not on file  Physical Activity: Not on file  Stress: Not on file  Social Connections: Not on file  Intimate Partner Violence: Not on file    Family History  Problem Relation Age of Onset   Diverticulosis Mother    Coronary artery disease Father    Heart disease Paternal Aunt    Heart disease Paternal Uncle    Breast cancer Paternal Aunt    Prostate cancer Brother  Leukemia Brother    Heart attack Brother    Hypertension Brother    Stroke Brother     Allergies  Allergen Reactions   Angiotensin Receptor Blockers Other (See Comments)    hyperkalemia   Aspirin  Other (See Comments)    Ecchymosis with high doses   Codeine Sulfate Nausea Only   Donepezil     Other reaction(s): Other (See Comments) Could not tolerate   Erythromycin Nausea And Vomiting   Sulfa Antibiotics Nausea Only   Sulfonamide Derivatives Nausea Only    Outpatient Medications Prior to Visit  Medication Sig   amLODipine  (NORVASC ) 5 MG tablet Take 1 tablet (5 mg total) by mouth daily.   amoxicillin -clavulanate (AUGMENTIN ) 200-28.5 MG/5ML suspension SHAKE WELL TAKE 6.25MLS BY MOUTH ONCE DAILY   buPROPion (WELLBUTRIN XL) 150 MG 24 hr tablet TAKE 1 TABLET BY MOUTH ONCE DAILY  ONCE EVERY MORNING   celecoxib  (CELEBREX ) 200 MG capsule Take 1 capsule (200 mg total) by mouth as needed (joint pain).   cycloSPORINE (RESTASIS) 0.05 % ophthalmic emulsion 1 drop 2 (two) times daily.   dicyclomine  (BENTYL ) 10 MG capsule TAKE 1 CAPSULE BY MOUTH 3 TIMES DAILY   esomeprazole  (NEXIUM ) 20 MG capsule Take 1 capsule (20 mg total) by mouth daily.   gabapentin  (NEURONTIN ) 100 MG capsule TAKE 2 CAPSULES BY MOUTH AT BEDTIME   hydrALAZINE  (APRESOLINE ) 50 MG tablet Take 1 tablet (50 mg total) by mouth 3 (three) times daily.   hydrOXYzine  (ATARAX ) 10 MG tablet TAKE 1 TABLET BY MOUTH TWICE DAILY AS NEEDED ITCHING ANXIETY   hyoscyamine  (LEVSIN  SL) 0.125 MG SL tablet Take 1 tablet (0.125 mg total) by mouth every 4 (four) hours as needed. Take 2 tablets by mouth twice daily as needed for stomach cramps   ipratropium (ATROVENT) 0.06 % nasal spray Place into both nostrils.   lidocaine  (XYLOCAINE ) 2 % solution SMARTSIG:By Mouth   LINZESS  72 MCG capsule TAKE 1 CAPSULE BY MOUTH ONCE EVERY MORNING   magnesium  gluconate (MAGONATE) 500 MG tablet Take 500 mg by mouth daily.   methenamine  (HIPREX ) 1 g tablet TAKE 1 TABLET BY MOUTH TWICE DAILY WITH A MEAL   metoprolol  tartrate (LOPRESSOR ) 25 MG tablet TAKE 1 TABLET BY MOUTH TWICE DAILY   Multiple Vitamin (MULTIVITAMIN) tablet Take 1 tablet by mouth daily.   nystatin cream (MYCOSTATIN) Apply topically 3 (three) times daily.   Probiotic Product (ALIGN PO) Take 1 tablet by mouth at bedtime.   sodium chloride  1 g tablet Take 1 g by mouth daily.   Vibegron  (GEMTESA ) 75 MG TABS TAKE 1 TABLET BY MOUTH ONCE DAILY   No facility-administered medications prior to visit.    Review of Systems  Constitutional: Negative.   HENT: Negative.    Eyes: Negative.   Respiratory: Negative.  Negative for shortness of breath.   Cardiovascular: Negative.  Negative for chest pain.  Gastrointestinal:  Positive for heartburn and nausea. Negative for abdominal pain,  constipation and diarrhea.  Genitourinary: Negative.   Musculoskeletal:  Negative for joint pain and myalgias.  Skin: Negative.   Neurological: Negative.  Negative for dizziness and headaches.  Endo/Heme/Allergies: Negative.   All other systems reviewed and are negative.      Objective:   BP (!) 122/54   Pulse 72   Ht 5' 3 (1.6 m)   Wt 128 lb 6.4 oz (58.2 kg)   SpO2 97%   BMI 22.75 kg/m   Vitals:   07/07/23 1130  BP: (!) 122/54  Pulse: 72  Height: 5' 3 (1.6 m)  Weight: 128 lb 6.4 oz (58.2 kg)  SpO2: 97%  BMI (Calculated): 22.75    Physical Exam Vitals and nursing note reviewed.  Constitutional:      Appearance: Normal appearance. She is normal weight.  HENT:     Head: Normocephalic and atraumatic.     Nose: Nose normal.     Mouth/Throat:     Mouth: Mucous membranes are moist.   Eyes:     Extraocular Movements: Extraocular movements intact.     Conjunctiva/sclera: Conjunctivae normal.     Pupils: Pupils are equal, round, and reactive to light.    Cardiovascular:     Rate and Rhythm: Normal rate and regular rhythm.     Pulses: Normal pulses.     Heart sounds: Normal heart sounds.  Pulmonary:     Effort: Pulmonary effort is normal.     Breath sounds: Normal breath sounds.  Abdominal:     General: Abdomen is flat. Bowel sounds are normal.     Palpations: Abdomen is soft.   Musculoskeletal:        General: Normal range of motion.     Cervical back: Normal range of motion.   Skin:    General: Skin is warm and dry.   Neurological:     General: No focal deficit present.     Mental Status: She is alert and oriented to person, place, and time.   Psychiatric:        Mood and Affect: Mood normal.        Behavior: Behavior normal.        Thought Content: Thought content normal.        Judgment: Judgment normal.      No results found for any visits on 07/07/23.  Recent Results (from the past 2160 hours)  Basic metabolic panel     Status: Abnormal    Collection Time: 05/24/23  1:34 PM  Result Value Ref Range   Glucose 79 70 - 99 mg/dL   BUN 28 10 - 36 mg/dL   Creatinine, Ser 1.61 (H) 0.57 - 1.00 mg/dL   eGFR 50 (L) >09 UE/AVW/0.98   BUN/Creatinine Ratio 27 12 - 28   Sodium 137 134 - 144 mmol/L   Potassium 5.1 3.5 - 5.2 mmol/L   Chloride 100 96 - 106 mmol/L   CO2 20 20 - 29 mmol/L   Calcium  9.7 8.7 - 10.3 mg/dL  Hepatic function panel     Status: None   Collection Time: 05/24/23  1:34 PM  Result Value Ref Range   Total Protein 7.0 6.0 - 8.5 g/dL   Albumin 4.3 3.6 - 4.6 g/dL   Bilirubin Total 0.3 0.0 - 1.2 mg/dL   Bilirubin, Direct 1.19 0.00 - 0.40 mg/dL   Alkaline Phosphatase 121 44 - 121 IU/L   AST 27 0 - 40 IU/L   ALT 28 0 - 32 IU/L  Urinalysis, Complete     Status: Abnormal   Collection Time: 05/24/23  1:42 PM  Result Value Ref Range   Specific Gravity, UA 1.015 1.005 - 1.030   pH, UA 5.5 5.0 - 7.5   Color, UA Yellow Yellow   Appearance Ur Hazy (A) Clear   Leukocytes,UA 1+ (A) Negative   Protein,UA 1+ (A) Negative/Trace   Glucose, UA Negative Negative   Ketones, UA Negative Negative   RBC, UA Trace (A) Negative   Bilirubin, UA Negative Negative   Urobilinogen, Ur 0.2 0.2 - 1.0 mg/dL  Nitrite, UA Negative Negative   Microscopic Examination See below:   Microscopic Examination     Status: Abnormal   Collection Time: 05/24/23  1:42 PM   Urine  Result Value Ref Range   WBC, UA 6-10 (A) 0 - 5 /hpf   RBC, Urine 11-30 (A) 0 - 2 /hpf   Epithelial Cells (non renal) 0-10 0 - 10 /hpf   Casts Present (A) None seen /lpf   Cast Type Hyaline casts N/A   Mucus, UA Present (A) Not Estab.   Bacteria, UA Few None seen/Few  CULTURE, URINE COMPREHENSIVE     Status: None   Collection Time: 05/24/23  2:26 PM   Specimen: Urine   UR  Result Value Ref Range   Urine Culture, Comprehensive Final report    Organism ID, Bacteria Comment     Comment: Mixed urogenital flora 4,000 Colonies/mL   BLADDER SCAN AMB NON-IMAGING      Status: None   Collection Time: 05/24/23  2:35 PM  Result Value Ref Range   Scan Result 0ml   CMP14+EGFR     Status: Abnormal   Collection Time: 06/22/23  1:31 PM  Result Value Ref Range   Glucose 98 70 - 99 mg/dL   BUN 23 10 - 36 mg/dL   Creatinine, Ser 4.78 (H) 0.57 - 1.00 mg/dL   eGFR 49 (L) >29 FA/OZH/0.86   BUN/Creatinine Ratio 22 12 - 28   Sodium 133 (L) 134 - 144 mmol/L   Potassium 5.1 3.5 - 5.2 mmol/L   Chloride 97 96 - 106 mmol/L   CO2 20 20 - 29 mmol/L   Calcium  9.6 8.7 - 10.3 mg/dL   Total Protein 6.7 6.0 - 8.5 g/dL   Albumin 4.2 3.6 - 4.6 g/dL   Globulin, Total 2.5 1.5 - 4.5 g/dL   Bilirubin Total 0.3 0.0 - 1.2 mg/dL   Alkaline Phosphatase 125 (H) 44 - 121 IU/L   AST 24 0 - 40 IU/L   ALT 23 0 - 32 IU/L      Assessment & Plan:  Continue Nexium . Add Pepcid . Zofran  for nausea.  Problem List Items Addressed This Visit       Digestive   Gastroesophageal reflux disease - Primary   Relevant Medications   famotidine  (PEPCID ) 20 MG tablet   ondansetron  (ZOFRAN -ODT) 4 MG disintegrating tablet    Return if symptoms worsen or fail to improve, for as scheduled with NK.   Total time spent: 25 minutes  [employer], NP  07/07/2023   This document may have been prepared by Dragon Voice Recognition software and as such may include unintentional dictation errors.

## 2023-07-11 ENCOUNTER — Telehealth: Payer: Self-pay | Admitting: Internal Medicine

## 2023-07-11 NOTE — Telephone Encounter (Signed)
 Patient's daughter wants to know if we can send a home health referral to see what services she is eligible for. They are wanting to keep her at home but think she would benefit from some type of home health. What is your recommendation?

## 2023-07-11 NOTE — Telephone Encounter (Signed)
 Pt daughter has stated that her BP has been so low that they cut back on the hydrolozine & not giving the linzess  due to loose stool

## 2023-07-11 NOTE — Telephone Encounter (Signed)
 Pt daughter called in regards to the state her mother is in. Daughter states that pt hasn't eaten a meal in 3 days & has had diarrhea on and off several times a day. She states that pt is sleeping a lot and drinking about 3 glasses of water a day. When pt is awake she is weak and lucid & getting worse as stated by the daughter. Pt daughter wants to know what to do from here to avoid taking her to the ED  Please advise

## 2023-07-13 ENCOUNTER — Ambulatory Visit: Admitting: Internal Medicine

## 2023-07-15 ENCOUNTER — Other Ambulatory Visit: Payer: Self-pay | Admitting: Internal Medicine

## 2023-07-15 DIAGNOSIS — L299 Pruritus, unspecified: Secondary | ICD-10-CM

## 2023-07-15 DIAGNOSIS — F419 Anxiety disorder, unspecified: Secondary | ICD-10-CM

## 2023-07-17 ENCOUNTER — Emergency Department
Admission: EM | Admit: 2023-07-17 | Discharge: 2023-07-25 | Disposition: E | Attending: Emergency Medicine | Admitting: Emergency Medicine

## 2023-07-17 ENCOUNTER — Emergency Department

## 2023-07-17 ENCOUNTER — Other Ambulatory Visit: Payer: Self-pay

## 2023-07-17 DIAGNOSIS — R0789 Other chest pain: Secondary | ICD-10-CM | POA: Diagnosis present

## 2023-07-17 DIAGNOSIS — I469 Cardiac arrest, cause unspecified: Secondary | ICD-10-CM | POA: Diagnosis not present

## 2023-07-17 LAB — CBC WITH DIFFERENTIAL/PLATELET
Abs Immature Granulocytes: 0.15 10*3/uL — ABNORMAL HIGH (ref 0.00–0.07)
Basophils Absolute: 0.1 10*3/uL (ref 0.0–0.1)
Basophils Relative: 1 %
Eosinophils Absolute: 0.2 10*3/uL (ref 0.0–0.5)
Eosinophils Relative: 2 %
HCT: 39.2 % (ref 36.0–46.0)
Hemoglobin: 12 g/dL (ref 12.0–15.0)
Immature Granulocytes: 1 %
Lymphocytes Relative: 28 %
Lymphs Abs: 3.4 10*3/uL (ref 0.7–4.0)
MCH: 26.3 pg (ref 26.0–34.0)
MCHC: 30.6 g/dL (ref 30.0–36.0)
MCV: 85.8 fL (ref 80.0–100.0)
Monocytes Absolute: 0.7 10*3/uL (ref 0.1–1.0)
Monocytes Relative: 6 %
Neutro Abs: 7.5 10*3/uL (ref 1.7–7.7)
Neutrophils Relative %: 62 %
Platelets: 300 10*3/uL (ref 150–400)
RBC: 4.57 MIL/uL (ref 3.87–5.11)
RDW: 15.5 % (ref 11.5–15.5)
WBC: 12 10*3/uL — ABNORMAL HIGH (ref 4.0–10.5)
nRBC: 0 % (ref 0.0–0.2)

## 2023-07-17 LAB — TROPONIN I (HIGH SENSITIVITY): Troponin I (High Sensitivity): 170 ng/L (ref <18)

## 2023-07-17 MED ORDER — IOHEXOL 350 MG/ML SOLN: 80.0000 mL | Freq: Once | INTRAVENOUS | Status: DC | PRN

## 2023-07-17 MED ORDER — EPINEPHRINE 1 MG/10ML IJ SOSY
PREFILLED_SYRINGE | INTRAMUSCULAR | Status: AC | PRN
  Administered 2023-07-17: 1 mg via INTRAVENOUS

## 2023-07-17 MED ORDER — SODIUM CHLORIDE 0.9 % IV BOLUS: 1000.0000 mL | Freq: Once | INTRAVENOUS | Status: DC

## 2023-07-17 MED ORDER — SODIUM CHLORIDE 0.9 % IV SOLN
INTRAVENOUS | Status: AC | PRN
  Administered 2023-07-17 (×2): 1000 mL via INTRAVENOUS

## 2023-07-20 ENCOUNTER — Ambulatory Visit: Admitting: Cardiology

## 2023-07-25 NOTE — ED Notes (Signed)
 Notified by patient family that patient has DNR. Paperwork given to Floy MD by Dwayne EDT.

## 2023-07-25 NOTE — ED Notes (Signed)
 Code blue called by Margeaux RN in [place]. Floy MD at bedside.

## 2023-07-25 NOTE — ED Provider Notes (Signed)
 [facility] Provider Note    Event Date/Time   First MD Initiated Contact with Patient 07/22/2023 0258     (approximate)   History   Chest Pain   HPI  Erin Hunt is a 89 y.o. female  who presents to the emergency department today because of concern for chest pain. Patient is in extremis and cannot give significant history.  However does indicate that the pain is in the center part of her chest.      Physical Exam   Triage Vital Signs: ED Triage Vitals  Encounter Vitals Group     BP      Girls Systolic BP Percentile      Girls Diastolic BP Percentile      Boys Systolic BP Percentile      Boys Diastolic BP Percentile      Pulse      Resp      Temp      Temp src      SpO2      Weight      Height      Head Circumference      Peak Flow      Pain Score      Pain Loc      Pain Education      Exclude from Growth Chart     Most recent vital signs: There were no vitals filed for this visit.  General: Awake, alert, in distress CV:  Good peripheral perfusion. Regular rate and rhythm. Resp:  Increased work of breathing, lungs clear. Abd:  No distention. Non tender.   ED Results / Procedures / Treatments   Labs (all labs ordered are listed, but only abnormal results are displayed) Labs Reviewed  CBC WITH DIFFERENTIAL/PLATELET - Abnormal; Notable for the following components:      Result Value   WBC 12.0 (*)    Abs Immature Granulocytes 0.15 (*)    All other components within normal limits  TROPONIN I (HIGH SENSITIVITY) - Abnormal; Notable for the following components:   Troponin I (High Sensitivity) 170 (*)    All other components within normal limits     EKG  I, Guadalupe Eagles, attending physician, personally viewed and interpreted this EKG  EKG Time: 0303 Rate: 70 Rhythm: sinus rhythm Axis: left axis deviation Intervals: qtc 503 QRS: LBBB ST changes: no st elevation Impression: abnormal  ekg    RADIOLOGY None   PROCEDURES:  Critical Care performed: Yes  CRITICAL CARE Performed by: Guadalupe Eagles   Total critical care time: 15 minutes  Critical care time was exclusive of separately billable procedures and treating other patients.  Critical care was necessary to treat or prevent imminent or life-threatening deterioration.  Critical care was time spent personally by me on the following activities: development of treatment plan with patient and/or surrogate as well as nursing, discussions with consultants, evaluation of patient's response to treatment, examination of patient, obtaining history from patient or surrogate, ordering and performing treatments and interventions, ordering and review of laboratory studies, ordering and review of radiographic studies, pulse oximetry and re-evaluation of patient's condition.   Procedures    MEDICATIONS ORDERED IN ED: Medications  sodium chloride  0.9 % bolus 1,000 mL (has no administration in time range)  iohexol  (OMNIPAQUE ) 350 MG/ML injection 80 mL (has no administration in time range)  EPINEPHrine  (ADRENALIN ) 1 MG/10ML injection (1 mg Intravenous Given 07/20/2023 0315)  0.9 %  sodium chloride  infusion (1,000 mLs Intravenous New Bag/Given 07/11/2023  [facility]itt[email or web address])     IMPRESSION / MDM / ASSESSMENT AND PLAN / ED COURSE  I reviewed the triage vital signs and the nursing notes.                              Differential diagnosis includes, but is not limited to, ACS, dissection, PE  Patient's presentation is most consistent with acute presentation with potential threat to life or bodily function.   The patient is on the cardiac monitor to evaluate for evidence of arrhythmia and/or significant heart rate changes.  Patient presented to the emergency department today because of concerns for chest pain.  On exam patient was in significant distress.  Patient was found to be significantly hypotensive.  Given patient's location of pain  and having described as radiate to the back did have concern for dissection.  Patient was emergently taken to CT scanner however upon arrival to the CT scanner the patient started having agonal breathing and pulses were lost.  1 round of CPR was performed prior to family alerting staff that the patient was a DNR.  I confirmed with family that she is DNR.  Efforts at resuscitation at that time were stopped.      FINAL CLINICAL IMPRESSION(S) / ED DIAGNOSES   Final diagnoses:  Cardiopulmonary arrest [facility])       Note:  This document was prepared using Dragon voice recognition software and may include unintentional dictation errors.    Floy Roberts, MD 07/22/2023 [phone]

## 2023-07-25 NOTE — ED Triage Notes (Signed)
 Pt presents via EMS c/o epigastric pain radiating into back. Pt presents hypotensive with BP in 60s systolic.   Pt pale on assessment.

## 2023-07-25 NOTE — ED Notes (Signed)
 Patient transported to CT with Katie RN and Editor, commissioning

## 2023-07-25 DEATH — deceased
